# Patient Record
Sex: Female | Born: 1960 | Race: White | Hispanic: No | Smoking: Never smoker
Health system: Southern US, Community
[De-identification: ages and names within clinical notes are randomized; demographics above are authoritative.]

## PROBLEM LIST (undated history)

## (undated) ENCOUNTER — Emergency Department (HOSPITAL_COMMUNITY): Payer: BC Managed Care – PPO

## (undated) DIAGNOSIS — F32A Depression, unspecified: Secondary | ICD-10-CM

## (undated) DIAGNOSIS — E559 Vitamin D deficiency, unspecified: Secondary | ICD-10-CM

## (undated) DIAGNOSIS — E669 Obesity, unspecified: Secondary | ICD-10-CM

## (undated) DIAGNOSIS — S83281D Other tear of lateral meniscus, current injury, right knee, subsequent encounter: Secondary | ICD-10-CM

## (undated) DIAGNOSIS — Z923 Personal history of irradiation: Secondary | ICD-10-CM

## (undated) DIAGNOSIS — F329 Major depressive disorder, single episode, unspecified: Secondary | ICD-10-CM

## (undated) DIAGNOSIS — R011 Cardiac murmur, unspecified: Secondary | ICD-10-CM

## (undated) DIAGNOSIS — S83289A Other tear of lateral meniscus, current injury, unspecified knee, initial encounter: Secondary | ICD-10-CM

## (undated) DIAGNOSIS — I10 Essential (primary) hypertension: Secondary | ICD-10-CM

## (undated) DIAGNOSIS — R0602 Shortness of breath: Secondary | ICD-10-CM

## (undated) DIAGNOSIS — R5383 Other fatigue: Secondary | ICD-10-CM

## (undated) DIAGNOSIS — K829 Disease of gallbladder, unspecified: Secondary | ICD-10-CM

## (undated) DIAGNOSIS — R7303 Prediabetes: Secondary | ICD-10-CM

## (undated) DIAGNOSIS — IMO0002 Reserved for concepts with insufficient information to code with codable children: Secondary | ICD-10-CM

## (undated) DIAGNOSIS — G4730 Sleep apnea, unspecified: Secondary | ICD-10-CM

## (undated) DIAGNOSIS — M25569 Pain in unspecified knee: Secondary | ICD-10-CM

## (undated) HISTORY — DX: Sleep apnea, unspecified: G47.30

## (undated) HISTORY — DX: Pain in unspecified knee: M25.569

## (undated) HISTORY — DX: Disease of gallbladder, unspecified: K82.9

## (undated) HISTORY — DX: Depression, unspecified: F32.A

## (undated) HISTORY — DX: Major depressive disorder, single episode, unspecified: F32.9

## (undated) HISTORY — DX: Vitamin D deficiency, unspecified: E55.9

## (undated) HISTORY — DX: Reserved for concepts with insufficient information to code with codable children: IMO0002

## (undated) HISTORY — PX: BREAST LUMPECTOMY: SHX2

## (undated) HISTORY — DX: Obesity, unspecified: E66.9

## (undated) HISTORY — DX: Other fatigue: R53.83

## (undated) HISTORY — DX: Shortness of breath: R06.02

## (undated) HISTORY — DX: Essential (primary) hypertension: I10

---

## 1997-06-14 ENCOUNTER — Other Ambulatory Visit: Admission: RE | Admit: 1997-06-14 | Discharge: 1997-06-14 | Payer: Self-pay | Admitting: *Deleted

## 1998-09-30 ENCOUNTER — Other Ambulatory Visit: Admission: RE | Admit: 1998-09-30 | Discharge: 1998-09-30 | Payer: Self-pay | Admitting: *Deleted

## 1999-02-06 HISTORY — PX: OTHER SURGICAL HISTORY: SHX169

## 1999-02-06 HISTORY — PX: LAPAROSCOPIC CHOLECYSTECTOMY: SUR755

## 1999-09-08 ENCOUNTER — Other Ambulatory Visit: Admission: RE | Admit: 1999-09-08 | Discharge: 1999-09-08 | Payer: Self-pay | Admitting: *Deleted

## 2001-09-08 ENCOUNTER — Ambulatory Visit (HOSPITAL_COMMUNITY): Admission: RE | Admit: 2001-09-08 | Discharge: 2001-09-08 | Payer: Self-pay | Admitting: Cardiology

## 2001-09-09 ENCOUNTER — Encounter: Payer: Self-pay | Admitting: Cardiology

## 2001-09-10 ENCOUNTER — Encounter: Payer: Self-pay | Admitting: *Deleted

## 2001-09-10 ENCOUNTER — Encounter: Admission: RE | Admit: 2001-09-10 | Discharge: 2001-09-10 | Payer: Self-pay | Admitting: *Deleted

## 2002-01-20 ENCOUNTER — Encounter: Payer: Self-pay | Admitting: General Surgery

## 2002-01-23 ENCOUNTER — Encounter (INDEPENDENT_AMBULATORY_CARE_PROVIDER_SITE_OTHER): Payer: Self-pay | Admitting: *Deleted

## 2002-01-23 ENCOUNTER — Observation Stay (HOSPITAL_COMMUNITY): Admission: RE | Admit: 2002-01-23 | Discharge: 2002-01-24 | Payer: Self-pay | Admitting: General Surgery

## 2002-01-23 ENCOUNTER — Encounter: Payer: Self-pay | Admitting: General Surgery

## 2005-02-27 ENCOUNTER — Encounter: Admission: RE | Admit: 2005-02-27 | Discharge: 2005-02-27 | Payer: Self-pay | Admitting: *Deleted

## 2008-06-14 ENCOUNTER — Other Ambulatory Visit: Admission: RE | Admit: 2008-06-14 | Discharge: 2008-06-14 | Payer: Self-pay | Admitting: Obstetrics and Gynecology

## 2010-06-23 NOTE — Op Note (Signed)
NAME:  Anna Martinez, Anna Martinez                           ACCOUNT NO.:  [id number]   MEDICAL RECORD NO.:  [id number]                   PATIENT TYPE:  AMB   LOCATION:  DAY                                  FACILITY:  [facility]   PHYSICIAN:  Timothy E. Earlene Plater, M.D.              DATE OF BIRTH:  Jul 01, 1960   DATE OF PROCEDURE:  01/23/2002  DATE OF DISCHARGE:                                 OPERATIVE REPORT   PREOPERATIVE DIAGNOSES:  Chronic cholecystolithiasis.   POSTOPERATIVE DIAGNOSES:  Chronic cholecystolithiasis.   PROCEDURE:  Laparoscopic cholecystectomy and operative cholangiogram.   SURGEON:  Timothy E. Earlene Plater, M.D.   ASSISTANT:  Donnie Coffin. Samuella Cota, M.D.   ANESTHESIA:  CRNA supervised M.D.   HISTORY OF PRESENT ILLNESS:  Anna Martinez is 31, very obese, hypertensive,  depression with chronic cholecystolithiasis. A recent cardiac workup  negative per Dr. Clarene Duke and she is prepared and ready for this surgery. This  has been carefully discussed with her.   The patient was seen and evaluated by anesthesia, identified and the permit  signed.   She was taken to the operating room, placed supine, general endotracheal  anesthesia administered. The abdomen was prepped and draped in the usual  fashion. An infraumbilical incision was made after injection of local  anesthetic. The fascia identified, opened vertically, peritoneum entered  without complication, Hasson catheter placed, tied in place with a #1  Vicryl. The abdomen insufflated, general peritoneoscopy was unremarkable. A  second 10 mm trocar placed in the mid epigastrium, two 5 mm trocars in the  right upper quadrant. A 0.25% Marcaine with epinephrine used throughout. The  gallbladder was grasped, placed on tension, the omental adhesions taken down  bluntly. The base of the gallbladder carefully visualized and dissected  free. The cystic duct entering the gallbladder was carefully dissected out.  A complete window was made posteriorly. The  cystic artery identified and  isolated. The cystic duct was clipped near the gallbladder, small incision  made in the cystic duct. The cholangiocath was placed into the cystic duct  percutaneously and using real-time fluoroscopy and half strength dye, a  complete cholangiogram was accomplished showing free and easy flow of dye  into the duodenum and a normal appearing cystic duct and hepatic duct  structures. The catheter was removed and the remnant of the cystic duct was  doubly clipped. The cystic duct completely divided, the artery isolated,  triply clipped and divided and the gallbladder dissected from the  gallbladder bed with cautery blunt dissection. A small incision was made in  the gallbladder and there was a small leak of bile. The gallbladder was  completely removed from the liver bed and placed in an EndoCatch bag. Then  further inspection revealed the gallbladder bed to be dry. The gallbladder  was removed through the infraumbilical incision which was then tied with #1  Vicryl. The area of dissection was copiously irrigated  until clear. And then  all irrigant,  CO2, instruments and trocar removed under direct vision. Counts were  correct, she tolerated it well. The skin incisions closed with 3-0 Monocryl  sutures and Steri-Strips. Final counts correct. She was awakened and taken  to the recovery room in good condition.                                               Timothy E. Earlene Plater, M.D.    TED/MEDQ  D:  01/23/2002  T:  01/23/2002  Job:  [id number]   cc:   Talmadge Coventry, M.D.  [address], [address]  [place]  [place] [address]  Fax: [phone]   Thereasa Solo. Little, M.D.  [address]  [place] [address]  Fax: [phone]

## 2012-08-11 ENCOUNTER — Encounter: Payer: Self-pay | Admitting: Obstetrics and Gynecology

## 2012-08-12 ENCOUNTER — Ambulatory Visit (INDEPENDENT_AMBULATORY_CARE_PROVIDER_SITE_OTHER): Payer: BC Managed Care – PPO | Admitting: Obstetrics and Gynecology

## 2012-08-12 ENCOUNTER — Encounter: Payer: Self-pay | Admitting: Obstetrics and Gynecology

## 2012-08-12 VITALS — BP 130/80 | Ht 68.5 in | Wt 347.0 lb

## 2012-08-12 DIAGNOSIS — Z01419 Encounter for gynecological examination (general) (routine) without abnormal findings: Secondary | ICD-10-CM

## 2012-08-12 NOTE — Progress Notes (Signed)
52 y.o.   Single    Caucasian   female   G1P1   here for annual exam.  Menses more irregular, about every other month.  Some trouble sleeping.    Patient's last menstrual period was 06/23/2012.          Sexually active: no  The current method of family planning is abstinence.    Exercising: no Last mammogram:  2010 normal Last pap smear:08/23/09 neg History of abnormal pap: no Smoking:never Alcohol:no Last colonoscopy:never; pt states she understands I rec she go but declines Last Bone Density:  never Last tetanus shot: 2011 Last cholesterol check: pcp  Hgb:pcp                Urine:pcp   Family History  Problem Relation Age of Onset  . Hypertension Mother   . Hypertension Father   . Cancer Father     prostate    There are no active problems to display for this patient.   Past Medical History  Diagnosis Date  . Depression   . Hypertension   . Obesity     Past Surgical History  Procedure Laterality Date  . Laparoscopy cholectomy  2001    Allergies: Penicillins  Current Outpatient Prescriptions  Medication Sig Dispense Refill  . atenolol (TENORMIN) 25 MG tablet       . losartan-hydrochlorothiazide (HYZAAR) 100-25 MG per tablet        No current facility-administered medications for this visit.    ROS: Pertinent items are noted in HPI.  Social Hx:  Single, one daughter born 1997, works as a Runner, broadcasting/film/video  Exam:    BP 130/80  Ht 5' 8.5" (1.74 m)  Wt 347 lb (157.398 kg)  BMI 51.99 kg/m2  LMP 06/23/2012  Wt up 22 pounds from last year Wt Readings from Last 3 Encounters:  08/12/12 347 lb (157.398 kg)     Ht Readings from Last 3 Encounters:  08/12/12 5' 8.5" (1.74 m)    General appearance: alert, cooperative and appears stated age Head: Normocephalic, without obvious abnormality, atraumatic Neck: no adenopathy, supple, symmetrical, trachea midline and thyroid not enlarged, symmetric, no tenderness/mass/nodules Lungs: clear to auscultation  bilaterally Breasts: Inspection negative, No nipple retraction or dimpling, No nipple discharge or bleeding, No axillary or supraclavicular adenopathy, Normal to palpation without dominant masses Heart: regular rate and rhythm Abdomen: soft, non-tender; bowel sounds normal; no masses,  no organomegaly Extremities: extremities normal, atraumatic, no cyanosis or edema Skin: Skin color, texture, turgor normal. No rashes or lesions Lymph nodes: Cervical, supraclavicular, and axillary nodes normal. No abnormal inguinal nodes palpated Neurologic: Grossly normal   Pelvic: External genitalia:  no lesions              Urethra:  normal appearing urethra with no masses, tenderness or lesions              Bartholins and Skenes: normal                 Vagina: normal appearing vagina with normal color and discharge, no lesions              Cervix: normal appearance              Pap taken: yes        Bimanual Exam:  Uterus:  uterus is non tender but size difficult d/t habitus  Adnexa: nontender and no masses                                      Rectovaginal: Confirms                                      Anus:  normal sphincter tone, no lesions  A: normal perimenopausal exam     obesity     P: mammogram pap smear counseled on breast self exam, mammography screening, adequate intake of calcium and vitamin D, diet and exercise return annually or prn   Rec:  PUS since pelvic exam is limited by habitus.     An After Visit Summary was printed and given to the patient.

## 2012-08-12 NOTE — Patient Instructions (Addendum)

## 2012-08-13 DIAGNOSIS — R87619 Unspecified abnormal cytological findings in specimens from cervix uteri: Secondary | ICD-10-CM

## 2012-08-13 DIAGNOSIS — IMO0002 Reserved for concepts with insufficient information to code with codable children: Secondary | ICD-10-CM

## 2012-08-13 HISTORY — DX: Reserved for concepts with insufficient information to code with codable children: IMO0002

## 2012-08-13 HISTORY — DX: Unspecified abnormal cytological findings in specimens from cervix uteri: R87.619

## 2012-08-13 NOTE — Addendum Note (Signed)
Addended by: Alison Murray on: 08/13/2012 09:39 AM   Modules accepted: Orders

## 2012-08-15 LAB — IPS PAP TEST WITH HPV

## 2012-08-20 ENCOUNTER — Encounter: Payer: Self-pay | Admitting: Obstetrics and Gynecology

## 2012-08-20 ENCOUNTER — Ambulatory Visit (INDEPENDENT_AMBULATORY_CARE_PROVIDER_SITE_OTHER): Payer: BC Managed Care – PPO | Admitting: Obstetrics and Gynecology

## 2012-08-20 ENCOUNTER — Ambulatory Visit (INDEPENDENT_AMBULATORY_CARE_PROVIDER_SITE_OTHER): Payer: BC Managed Care – PPO

## 2012-08-20 NOTE — Progress Notes (Signed)
     52 yo single WF G1P1 with morbid obesity here for PUS due to limited pelvic exam. Images reviewed.  PUS shows nl uterus and ovaries.  Pt reassured.  Rec:  Repeat USG about every 2-3 years or as rec by her physician.

## 2012-08-20 NOTE — Patient Instructions (Signed)
Return for routine care 

## 2012-08-21 ENCOUNTER — Other Ambulatory Visit: Payer: Self-pay | Admitting: *Deleted

## 2012-08-21 DIAGNOSIS — IMO0002 Reserved for concepts with insufficient information to code with codable children: Secondary | ICD-10-CM

## 2012-08-29 ENCOUNTER — Encounter: Payer: Self-pay | Admitting: Obstetrics and Gynecology

## 2012-08-29 ENCOUNTER — Ambulatory Visit (INDEPENDENT_AMBULATORY_CARE_PROVIDER_SITE_OTHER): Payer: BC Managed Care – PPO | Admitting: Obstetrics and Gynecology

## 2012-08-29 VITALS — BP 130/76 | HR 82 | Resp 18 | Ht 68.5 in | Wt 350.0 lb

## 2012-08-29 DIAGNOSIS — R6889 Other general symptoms and signs: Secondary | ICD-10-CM

## 2012-08-29 DIAGNOSIS — IMO0002 Reserved for concepts with insufficient information to code with codable children: Secondary | ICD-10-CM

## 2012-08-29 NOTE — Progress Notes (Signed)
52 y.o. Single  Caucasian  female  OB History   Grav Para Term Preterm Abortions TAB SAB Ect Mult Living   1 1        1      here for colposcopy. 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    Patient's last menstrual period was 06/23/2012.  Contraception:  Not sexually active for the last 8 years   Blood pressure 130/76, pulse 82, resp. rate 18, height 5' 8.5" (1.74 m), weight 350 lb (158.759 kg), last menstrual period 06/23/2012.  Procedure explained and patient's questions were invited and answered.  Consent form signed.    Role of HPV in genesis of SIL discussed with patient, and questions answered.   Speculum inserted atraumatically and cervix visualized.  3% acetic acid applied.  Cervix examined using 3.75 and 7.5   X magnification and green filter.    Gross appearance:normal  Squamocolumnar junction seen in entirety: no   no visible lesions  endocervical curettage performed and specimen labelled and sent to pathology    Patient tolerated procedure well.   Plan:  Will base further treatment on Pathology findings. Vigorous ECC done using a tenac to stabilize the cx, since the os was quite small.  With pt not being sexually active for the last 8 years, if ECC does not show HGSIL, rec:  Pap r one year with cotesting.  Post biopsy instructions and AVS given to patient.

## 2012-08-29 NOTE — Patient Instructions (Signed)
Colposcopy  Care After  Colposcopy is a procedure in which a special tool is used to magnify the surface of the cervix. A tissue sample (biopsy) may also be taken. This sample will be looked at for cervical cancer or other problems. After the test:  · You may have some cramping.  · Lie down for a few minutes if you feel lightheaded.  ·  You may have some bleeding which should stop in a few days.  HOME CARE  · Do not have sex or use tampons for 2 to 3 days or as told.  · Only take medicine as told by your doctor.  · Continue to take your birth control pills as usual.  Finding out the results of your test  Ask when your test results will be ready. Make sure you get your test results.  GET HELP RIGHT AWAY IF:  · You are bleeding a lot or are passing blood clots.  · You develop a fever of 102° F (38.9° C) or higher.  · You have abnormal vaginal discharge.  · You have cramps that do not go away with medicine.  · You feel lightheaded, dizzy, or pass out (faint).  MAKE SURE YOU:   · Understand these instructions.  · Will watch your condition.  · Will get help right away if you are not doing well or get worse.  Document Released: 07/11/2007 Document Revised: 04/16/2011 Document Reviewed: 07/11/2007  ExitCare® Patient Information ©2014 [employer].

## 2012-09-02 LAB — IPS CERVICAL/ECC/EMB/VULVAR/VAGINAL BIOPSY

## 2013-09-04 ENCOUNTER — Encounter: Payer: Self-pay | Admitting: Gynecology

## 2013-09-04 ENCOUNTER — Ambulatory Visit (INDEPENDENT_AMBULATORY_CARE_PROVIDER_SITE_OTHER): Payer: BC Managed Care – PPO | Admitting: Gynecology

## 2013-09-04 ENCOUNTER — Telehealth: Payer: Self-pay | Admitting: Gynecology

## 2013-09-04 VITALS — BP 124/80 | HR 72 | Resp 16 | Ht 68.5 in | Wt 321.0 lb

## 2013-09-04 DIAGNOSIS — N951 Menopausal and female climacteric states: Secondary | ICD-10-CM

## 2013-09-04 DIAGNOSIS — IMO0002 Reserved for concepts with insufficient information to code with codable children: Secondary | ICD-10-CM

## 2013-09-04 DIAGNOSIS — R6889 Other general symptoms and signs: Secondary | ICD-10-CM

## 2013-09-04 DIAGNOSIS — Z01419 Encounter for gynecological examination (general) (routine) without abnormal findings: Secondary | ICD-10-CM

## 2013-09-04 DIAGNOSIS — Z8742 Personal history of other diseases of the female genital tract: Secondary | ICD-10-CM

## 2013-09-04 MED ORDER — NORETHIN-ETH ESTRAD-FE BIPHAS 1 MG-10 MCG / 10 MCG PO TABS: 1.0000 | ORAL_TABLET | Freq: Every day | ORAL | Status: DC

## 2013-09-04 NOTE — Telephone Encounter (Signed)
Patient wants to know if there is another medication we can give her instead of Norethindrone-Ethinyl Estradiol-Fe Biphas (LO LOESTRIN FE) 1 MG-10 MCG / 10 MCG tablet  Take 1 tablet by mouth daily., Starting 09/04/2013, Until Discontinued, Normal, Last Dose: Not Recorded  Refills: 3 ordered Pharmacy: [id number] - , [address] AT [place] that that it is costing her $54 a month.

## 2013-09-04 NOTE — Progress Notes (Signed)
53 y.o. Single Caucasian female   G1P1 here for annual exam. Pt reports menses are regular.  She does report hot flashes, does not have night sweats, does have vaginal dryness.  She is not using lubricants.  Pt reports starting microgestin to regulate menses in peri-menopause- started by PCP.  Symptoms are better.  Patient's last menstrual period was 07/21/2013.          Sexually active: No.  The current method of family planning is OCP (estrogen/progesterone).    Exercising: Yes.    Weights, pool, stationary bike 2x/wk Last pap: 08/13/12 LGSIL - HPV Abnormal PAP: 08/13/12 Mammogram: 2010  BSE:yes Colonoscopy: never had one DEXA: none Alcohol: no Tobacco: no   Health Maintenance  Topic Date Due  . Tetanus/tdap  07/12/1978  . Mammogram  07/11/2009  . Colonoscopy  07/11/2009  . Influenza Vaccine  09/05/2013  . Pap Smear  08/14/2015    Family History  Problem Relation Age of Onset  . Hypertension Mother   . Hypertension Father   . Cancer Father     prostate    There are no active problems to display for this patient.   Past Medical History  Diagnosis Date  . Depression   . Hypertension   . Obesity   . Abnormal Pap smear 08/13/12    LSIL may include HPV or mild Dysplasia (CIN1)    Past Surgical History  Procedure Laterality Date  . Laparoscopy cholectomy  2001    Allergies: Penicillins  Current Outpatient Prescriptions  Medication Sig Dispense Refill  . atenolol (TENORMIN) 25 MG tablet       . losartan-hydrochlorothiazide (HYZAAR) 100-25 MG per tablet       . Multiple Vitamins-Calcium (ONE-A-DAY WOMENS PO) Take by mouth.      Marland Kitchen MICROGESTIN 1-20 MG-MCG tablet daily.       No current facility-administered medications for this visit.    ROS: Pertinent items are noted in HPI.  Exam:    BP 124/80  Pulse 72  Resp 16  Ht 5' 8.5" (1.74 m)  Wt 321 lb (145.605 kg)  BMI 48.09 kg/m2  LMP 07/21/2013 Weight change: @WEIGHTCHANGE @ Last 3 height recordings:  Ht Readings  from Last 3 Encounters:  09/04/13 5' 8.5" (1.74 m)  08/29/12 5' 8.5" (1.74 m)  08/12/12 5' 8.5" (1.74 m)   General appearance: alert, cooperative and appears stated age Head: Normocephalic, without obvious abnormality, atraumatic Neck: no adenopathy, no carotid bruit, no JVD, supple, symmetrical, trachea midline and thyroid not enlarged, symmetric, no tenderness/mass/nodules Lungs: clear to auscultation bilaterally Breasts: normal appearance, no masses or tenderness Heart: regular rate and rhythm, S1, S2 normal, no murmur, click, rub or gallop Abdomen: soft, non-tender; bowel sounds normal; no masses,  no organomegaly Extremities: extremities normal, atraumatic, no cyanosis or edema Skin: Skin color, texture, turgor normal. No rashes or lesions Lymph nodes: Cervical, supraclavicular, and axillary nodes normal. no inguinal nodes palpated Neurologic: Grossly normal   Pelvic: External genitalia:  no lesions              Urethra: normal appearing urethra with no masses, tenderness or lesions              Bartholins and Skenes: Bartholin's, Urethra, Skene's normal                 Vagina: normal appearing vagina with normal color and discharge, no lesions              Cervix: normal appearance  Pap taken: Yes.          Bimanual Exam:  Uterus:  uterus is normal size, shape, consistency and nontender                                      Adnexa:    no masses                                      Rectovaginal: Confirms                                      Anus:  normal sphincter tone, no lesions   1. Routine Gyn exam counseled on breast self exam, mammography screening-overdue!!  09/07/13, 11:45am  Will make appt for pt before she leaves today ,adequate intake of calcium and vitamin D, diet and exercise return annually or prn Discussed PAP guideline changes, importance of weight bearing exercises, calcium, vit D and balanced diet.  2. Perimenopausal symptoms Will decrease  estrogen from 60mcg to 13mcg and then off, check [place] off estrogen - Norethindrone-Ethinyl Estradiol-Fe Biphas (LO LOESTRIN FE) 1 MG-10 MCG / 10 MCG tablet; Take 1 tablet by mouth daily.  Dispense: 1 Package; Refill: 11  3. History of abnormal cervical Pap smear Reviewed LGSIL and HPV association with cervical dysplasia, repeat today and reivewed f/u anticipated.  Natural course of HPV infection - Pap Test with HP (IPS)   An After Visit Summary was printed and given to the patient.

## 2013-09-06 ENCOUNTER — Other Ambulatory Visit: Payer: Self-pay | Admitting: Gynecology

## 2013-09-07 NOTE — Telephone Encounter (Signed)
Tiffany A Decker at 09/04/2013 3:37 PM     Status: Signed        Patient wants to know if there is another medication we can give her instead of Norethindrone-Ethinyl Estradiol-Fe Biphas (LO LOESTRIN FE) 1 MG-10 MCG / 10 MCG tablet  Take 1 tablet by mouth daily., Starting 09/04/2013, Until Discontinued, Normal, Last Dose: Not Recorded  Refills: 3 ordered Pharmacy: [id number] - [place], [address] AT [place] that that it is costing her $54 a month.   Last AEX: 09/04/13 Pt wanted something else because of the cost

## 2013-09-07 NOTE — Telephone Encounter (Signed)
pls confirm that Lo Loestrin is $54/m and not for the 25m, if later, we can change for 1/m for 22m,   Pt can stop ocp and we can check an [facility] as well as she is 53yo and not worry about the cost of the ocp

## 2013-09-08 LAB — IPS PAP TEST WITH HPV

## 2013-09-08 NOTE — Telephone Encounter (Signed)
S/W Pharmacy.  [facility] stated its $64 and that's for 1 mth. Would you like the pt to stop ocp and have her [facility] checked or give her a cheaper rx?

## 2013-09-09 ENCOUNTER — Other Ambulatory Visit: Payer: Self-pay | Admitting: *Deleted

## 2013-09-09 DIAGNOSIS — N951 Menopausal and female climacteric states: Secondary | ICD-10-CM

## 2013-09-09 NOTE — Telephone Encounter (Signed)
Faxed refill request received from [facility] for [place] 1/20 21'S  Pt was given refill on LO LOESTRIN on 09/04/13, #1 X 3 Last AEX - 09/04/13 Next AEX - 09/07/14 RX denied.  Dose decrease at AEX.  Pt should take new RX.  See 09/06/13 phone note for more information regarding new LoLoestrin RX.

## 2013-09-10 NOTE — Telephone Encounter (Signed)
I called pt to discuss pap results and pt asked again about medication. Pt would like to know:  1 - When to have Jennette drawn? 2 - advised pt that she may be able to stop OCP and not worry about cost, but is worried about side effects of abruptly stopping.  Wants to know if there is a cheaper alternative than LoLoestrin.  Offered pt savings card, but pt declined saying she has had a problem with cards in the past with this insurance when trying to get her daughter's medication.  Please advise.  Pt aware Dr. Charlies Constable out of the office today and is OK to wait until tomorrow for response.

## 2013-09-11 NOTE — Telephone Encounter (Signed)
Patient aware to stop ocp and return to our office to have fsh level checked, if she is menopausal then she's aware we can start her on post-menopausal estrogen. Aware to use condoms until we can determine if she is menopausal. Will call insurance to check and see if it's covered.

## 2013-09-11 NOTE — Telephone Encounter (Signed)
Pt is 17 and can stop ocp without weaning, and rto for labs, if fsh is high we can put her on post-menopausal estrogen if she needs, it is not high enough to suppress ovary.  Should use condom until we establish she is PM.

## 2013-12-07 ENCOUNTER — Encounter: Payer: Self-pay | Admitting: Gynecology

## 2014-08-10 ENCOUNTER — Telehealth: Payer: Self-pay | Admitting: Obstetrics & Gynecology

## 2014-08-10 NOTE — Telephone Encounter (Signed)
Patient is a previous [facility] patient that would like to see Dr. Sabra Heck for hormone replacement therapy.

## 2014-08-10 NOTE — Telephone Encounter (Signed)
Spoke with patient. Patient states that she would like to discuss starting on HRT with Dr.Miller. Appointment scheduled for 08/20/2014 at 2:30pm with Dr.Miller. Patient is agreeable to date and time. States she has had lab work performed with her PCP that she would like to have sent to our office. Fax number provided.  Routing to provider for final review. Patient agreeable to disposition. Will close encounter.

## 2014-08-20 ENCOUNTER — Encounter: Payer: Self-pay | Admitting: Obstetrics & Gynecology

## 2014-08-20 ENCOUNTER — Ambulatory Visit (INDEPENDENT_AMBULATORY_CARE_PROVIDER_SITE_OTHER): Payer: BC Managed Care – PPO | Admitting: Obstetrics & Gynecology

## 2014-08-20 VITALS — BP 158/82 | HR 72 | Resp 16 | Ht 68.5 in | Wt 340.0 lb

## 2014-08-20 DIAGNOSIS — N951 Menopausal and female climacteric states: Secondary | ICD-10-CM | POA: Diagnosis not present

## 2014-08-20 DIAGNOSIS — Z79890 Hormone replacement therapy (postmenopausal): Secondary | ICD-10-CM

## 2014-08-20 MED ORDER — PROGESTERONE MICRONIZED 200 MG PO CAPS: ORAL_CAPSULE | ORAL | Status: DC

## 2014-08-20 MED ORDER — ESTRADIOL 1 MG PO TABS: 1.0000 mg | ORAL_TABLET | Freq: Every day | ORAL | Status: DC

## 2014-08-20 NOTE — Progress Notes (Signed)
Patient ID: Anna Martinez, female   DOB: 1960/03/18, 54 y.o.   MRN: [id number]   54 yo G1P1 SWF here discussion of HRT.  Was on Loloestrin and stopped in July of 2015.  Since then has been experiencing hot flashes, night sweats, sleep disturbance.  Had lost 25 pounds in early 2015 and since stopping the Loloestrin, has gained most of it back.  She contributes some of this to sleep disturbance and fatigue.  Would like to discuss HRT usage.    Reviewed medical hx, surgical hx, family hx, meds, allergies with pt.  Reviewed risks with pt including DVT, PE, stroke, MI, increased breast cancer risks as well as benefits including decreased risks of colon cancer, improved bone health and improved quality of life measures.  Pt feels HRT is something she would like to try.    Last MMG 09/07/13.  Aware I would highly recommend yearly MMG if on HRT.  Pt in agreement.    Assessment:  Menopausal symptoms, desirous of starting HRT  Plan:  Estradiol 1.0mg  daily and Prometrium 200mg  nightly days 1-15 each month.  Expect the possibility of bleeding.   Recheck 6-8 weeks for AEX and f/u after starting HRT.   ~15 minutes spent with patient >50% of time was in face to face discussion of above.

## 2014-08-21 ENCOUNTER — Encounter: Payer: Self-pay | Admitting: Obstetrics & Gynecology

## 2014-08-21 DIAGNOSIS — Z6841 Body Mass Index (BMI) 40.0 and over, adult: Secondary | ICD-10-CM | POA: Insufficient documentation

## 2014-08-21 DIAGNOSIS — Z79890 Hormone replacement therapy (postmenopausal): Secondary | ICD-10-CM | POA: Insufficient documentation

## 2014-09-07 ENCOUNTER — Ambulatory Visit: Payer: BC Managed Care – PPO | Admitting: Gynecology

## 2014-09-14 ENCOUNTER — Telehealth: Payer: Self-pay | Admitting: Obstetrics & Gynecology

## 2014-09-14 DIAGNOSIS — N959 Unspecified menopausal and perimenopausal disorder: Secondary | ICD-10-CM

## 2014-09-14 NOTE — Telephone Encounter (Signed)
Return call to Kaitlyn. °

## 2014-09-14 NOTE — Telephone Encounter (Signed)
She was on OCP until last July when she stopped and started having menopausal symptoms.  Highly unlikely that she needs OCPs but could come in for [facility] to be completely sure.  If elevated, I would recommended continuing with HRT.

## 2014-09-14 NOTE — Telephone Encounter (Signed)
Left message to call Wandra Babin at [phone]. 

## 2014-09-14 NOTE — Telephone Encounter (Addendum)
Patient would like to speak with nurse because she is spotting on the progesterone. Chart to triage.

## 2014-09-14 NOTE — Telephone Encounter (Signed)
Spoke with patient. Patient states that she started taking Progesterone on 8/1. Patient started to have spotting a couple of days ago. Advised this is common with taking Progesterone. States she has been taking this in the morning instead of at night because she can not remember it otherwise. Patient is in a new relationship and is worried about pregnancy. Interested in taking oral birth control. Advised I will speak with Dr.Miller regarding her concerns and return call as OCP is likely not needed at this time. Patient is agreeable and verbalizes understanding.

## 2014-09-15 NOTE — Telephone Encounter (Signed)
Left message to call Damondre Pfeifle at [phone]. 

## 2014-09-15 NOTE — Telephone Encounter (Signed)
Spoke with patient. Advised of message as seen below from [place]. Patient is agreeable. Lab appointment for [facility] scheduled for tomorrow 09/16/2014 at [place]. Patient is agreeable to date and time.  Routing to provider for final review. Patient agreeable to disposition. Will close encounter.

## 2014-09-16 ENCOUNTER — Other Ambulatory Visit (INDEPENDENT_AMBULATORY_CARE_PROVIDER_SITE_OTHER): Payer: BC Managed Care – PPO

## 2014-09-16 DIAGNOSIS — N959 Unspecified menopausal and perimenopausal disorder: Secondary | ICD-10-CM

## 2014-09-16 LAB — FOLLICLE STIMULATING HORMONE: FSH: 35.1 m[IU]/mL

## 2014-09-20 ENCOUNTER — Telehealth: Payer: Self-pay

## 2014-09-20 NOTE — Telephone Encounter (Signed)
-----   Message from Robley Fries, [place] sent at 09/17/2014  1:24 PM EDT ----- Verline Lema, I received this by mistake.//kn

## 2014-09-20 NOTE — Telephone Encounter (Signed)
It is fine for her to switch to micronor.  Much lower dosage of progesterone.  Doubtful she will bleed on this but will give complete assurance for contraception.  Does this answer the question?

## 2014-09-20 NOTE — Telephone Encounter (Signed)
Spoke with patient. Advised of results as seen below from [place]. Patient is agreeable. Patient states with taking Estradiol and Progesterone together days 1-15 of the month she has been emotional, had more acne, and bleeding. Advised light bleeding is not uncommon with taking Progesterone. Advised will need to speak with Dr.Miller regarding increased acne and emotions. Paitent is agreeable.  Notes Recorded by Robley Fries, CMA on 09/17/2014 at 1:24 PM [place], I received this by mistake.//kn Notes Recorded by Megan Salon, MD on 09/17/2014 at 1:22 PM Please inform pt FSH is 35. This is in menopausal range and her pregnancy risk is extremely low but if she has an anxiety about this at all, can go on OCP. I would use micronor for her. She can stop the HRT and start micronor. Ok to send rx to pharmacy if she desires. Has AEX scheduled in Sept and we could reassess and discuss at that time.

## 2014-09-21 NOTE — Telephone Encounter (Signed)
Per previous phone call with patient she is less worried about contraception needs now that [facility] has returned. Is worried about having menopausal symptoms if she switches to Micronor. Prefers to stay on Estradiol Progesterone combination but is concerned about the side effects of acne, increased emotions, and bleeding. Wondering if she needs to decrease progesterone dosage. Sorry my last message was not very descriptive.

## 2014-09-22 ENCOUNTER — Other Ambulatory Visit: Payer: Self-pay | Admitting: Obstetrics & Gynecology

## 2014-09-22 MED ORDER — PROGESTERONE MICRONIZED 100 MG PO CAPS: ORAL_CAPSULE | ORAL | Status: DC

## 2014-09-22 NOTE — Telephone Encounter (Signed)
Spoke with patient. Advised of message as seen below from [place]. Patient is agreeable and verbalizes understanding. Will call if becomes more fatigued or tired with taking Progesterone in the morning next month.  Routing to provider for final review. Patient agreeable to disposition. Will close encounter.

## 2014-09-22 NOTE — Telephone Encounter (Signed)
Spoke with patient. Advised of message as seen below from [place]. Patient is agreeable and verbalizes understanding. Rx for Progesterone 100 mg daily #15 1RF until aex sent to pharmacy on file. Patient is asking if she can start taking this medication in the morning. "She told me to take it at night, but I can never remember it at night. I would do better taking it in the morning." Advised will speak with Dr.Miller and return call. Patient is agreeable.  Dr.Miller, Okay for patient to take Progesterone in the AM?

## 2014-09-22 NOTE — Telephone Encounter (Signed)
Ok to decrease progesterone to 100mg  daily.  I would change from days 1-15 to daily dosing.  Rx will need changing.  Thanks.

## 2014-09-22 NOTE — Telephone Encounter (Signed)
AM is fine.  It just sometimes makes women sleepy.  So if feeling more fatigue or tired with it, will change to a different progesterone.  Just have her let us know.  Thanks.

## 2014-09-22 NOTE — Telephone Encounter (Signed)
09/22/14 #15/1 rfs sent to [facility] on [address]-rx denied.

## 2014-10-15 ENCOUNTER — Telehealth: Payer: Self-pay | Admitting: Obstetrics & Gynecology

## 2014-10-15 ENCOUNTER — Ambulatory Visit: Payer: BC Managed Care – PPO | Admitting: Obstetrics & Gynecology

## 2014-10-15 NOTE — Telephone Encounter (Signed)
Patient rescheduled appointment to 10/19/14 at 4:00pm.    Routing to provider for final review. Patient agreeable to disposition. Will close encounter.

## 2014-10-15 NOTE — Telephone Encounter (Signed)
Patient says she is okay to be seen 10/19/14 at 4:00 with Dr. Sabra Heck. Appointment scheduled by Bertha Stakes.

## 2014-10-15 NOTE — Telephone Encounter (Signed)
Call to patient to reschedule appointment for today 10/15/14 at 3:00 to Tuesday 10/19/14 at 4:00, or to another day if that works better for patient. Left patient voicemail to return my call.

## 2014-10-19 ENCOUNTER — Ambulatory Visit (INDEPENDENT_AMBULATORY_CARE_PROVIDER_SITE_OTHER): Payer: BC Managed Care – PPO | Admitting: Obstetrics & Gynecology

## 2014-10-19 ENCOUNTER — Encounter: Payer: Self-pay | Admitting: Obstetrics & Gynecology

## 2014-10-19 ENCOUNTER — Telehealth: Payer: Self-pay

## 2014-10-19 VITALS — BP 140/86 | HR 72 | Resp 14 | Ht 68.5 in | Wt 335.0 lb

## 2014-10-19 DIAGNOSIS — Z124 Encounter for screening for malignant neoplasm of cervix: Secondary | ICD-10-CM

## 2014-10-19 DIAGNOSIS — Z202 Contact with and (suspected) exposure to infections with a predominantly sexual mode of transmission: Secondary | ICD-10-CM | POA: Diagnosis not present

## 2014-10-19 DIAGNOSIS — Z0000 Encounter for general adult medical examination without abnormal findings: Secondary | ICD-10-CM

## 2014-10-19 DIAGNOSIS — Z23 Encounter for immunization: Secondary | ICD-10-CM | POA: Diagnosis not present

## 2014-10-19 DIAGNOSIS — Z01419 Encounter for gynecological examination (general) (routine) without abnormal findings: Secondary | ICD-10-CM

## 2014-10-19 LAB — POCT URINALYSIS DIPSTICK
Bilirubin, UA: NEGATIVE
Blood, UA: 50
Glucose, UA: NEGATIVE
Ketones, UA: NEGATIVE
Leukocytes, UA: NEGATIVE
Nitrite, UA: NEGATIVE
Protein, UA: NEGATIVE
Urobilinogen, UA: NEGATIVE
pH, UA: 5

## 2014-10-19 MED ORDER — FLUCONAZOLE 150 MG PO TABS: ORAL_TABLET | ORAL | Status: DC

## 2014-10-19 MED ORDER — ESTRADIOL 1 MG PO TABS: 1.0000 mg | ORAL_TABLET | Freq: Every day | ORAL | Status: DC

## 2014-10-19 MED ORDER — MEDROXYPROGESTERONE ACETATE 5 MG PO TABS: 5.0000 mg | ORAL_TABLET | Freq: Every day | ORAL | Status: DC

## 2014-10-19 MED ORDER — NYSTATIN 100000 UNIT/GM EX CREA: 1.0000 "application " | TOPICAL_CREAM | Freq: Two times a day (BID) | CUTANEOUS | Status: DC

## 2014-10-19 NOTE — Patient Instructions (Signed)
Don't forget to call and schedule your mammogram

## 2014-10-19 NOTE — Telephone Encounter (Signed)
Call to [place] to change her Estradiol and Provera to a 90 day supply.//kn

## 2014-10-19 NOTE — Progress Notes (Signed)
54 y.o. G1P1 SingleCaucasianF here for annual exam.  Pt is in a new relationship.  Having some discharge and itching.  Having a little bit of odor.  Did use OTC monistat.    Pt started HRT in mid July.  Pt reports her hot flashes are gone.  She did have some moodiness after starting the Progesterone 200mg  .  This also caused anxiety and hormonal changes.  Dosage was lowered and this better but not where she'd like it to be.    PCP:  Dr. Carlos Levering.  [facility] [address].    Patient's last menstrual period was 07/21/2013.          Sexually active: Yes.    The current method of family planning is none.    Exercising: No.  n/a Smoker:  no  Health Maintenance: Pap:  09/04/13 wnl, HR HPV neg History of abnormal Pap:  no MMG:  09/07/2013 Category b, Bi-rads category 1 neg Colonoscopy: n/a BMD:   n/a TDaP:  Will do today Screening Labs: PCP, Hb today: PCP, Urine today:    reports that she has never smoked. She has never used smokeless tobacco. She reports that she does not drink alcohol or use illicit drugs.  Past Medical History  Diagnosis Date  . Depression   . Hypertension   . Obesity   . 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 Sleep apnea     using c-pap machine    Past Surgical History  Procedure Laterality Date  . Laparoscopy cholectomy  2001    Current Outpatient Prescriptions  Medication Sig Dispense Refill  . atenolol (TENORMIN) 25 MG tablet     . estradiol (ESTRACE) 1 MG tablet Take 1 tablet (1 mg total) by mouth daily. 30 tablet 12  . losartan-hydrochlorothiazide (HYZAAR) 100-25 MG per tablet     . progesterone (PROMETRIUM) 100 MG capsule Take one capsule by mouth days 1-15 of each month. 15 capsule 1   No current facility-administered medications for this visit.    Family History  Problem Relation Age of Onset  . Hypertension Mother   . Hypertension Father   . Cancer Father     prostate    ROS:  Pertinent items are  noted in HPI.  Otherwise, a comprehensive ROS was negative.  Exam:   BP 140/86 mmHg  Pulse 72  Resp 14  Ht 5' 8.5" (1.74 m)  Wt 335 lb (151.955 kg)  BMI 50.19 kg/m2  LMP 07/21/2013  Weight change: +13#  Height: 5' 8.5" (174 cm)  Ht Readings from Last 3 Encounters:  10/19/14 5' 8.5" (1.74 m)  08/20/14 5' 8.5" (1.74 m)  09/04/13 5' 8.5" (1.74 m)    General appearance: alert, cooperative and appears stated age Head: Normocephalic, without obvious abnormality, atraumatic Neck: no adenopathy, supple, symmetrical, trachea midline and thyroid normal to inspection and palpation Lungs: clear to auscultation bilaterally Breasts: normal appearance, no masses or tenderness, appearance of yeast between breasts Heart: regular rate and rhythm Abdomen: soft, non-tender; bowel sounds normal; no masses,  no organomegaly Extremities: extremities normal, atraumatic, no cyanosis or edema Skin: Skin color, texture, turgor normal. No rashes or lesions Lymph nodes: Cervical, supraclavicular, and axillary nodes normal. No abnormal inguinal nodes palpated Neurologic: Grossly normal   Pelvic: External genitalia:  no lesions              Urethra:  normal appearing urethra with no masses, tenderness or lesions  Bartholins and Skenes: normal                 Vagina: normal appearing vagina with normal color, thick whitish discharge noted, minimal odor              Cervix: no lesions              Pap taken: Yes.   Bimanual Exam:  Uterus:  normal size, contour, position, consistency, mobility, non-tender              Adnexa: normal adnexa and no mass, fullness, tenderness               Rectovaginal: Confirms               Anus:  normal sphincter tone, no lesions  Chaperone was present for exam.  A:  Well Woman with normal exam PMP, on HRT ([place] 34 earlier this year) Hypertension H/O LGILS pap with neg HR HPV 2014, Colposcopy 2014 with Dr. Joan Flores Skin yeast Desires STD testing Obesity  P:    Mammogram yearly.  Pt aware is due.  Pt states will schedule. pap smear with HR HPV testing today Tdap today Continue estradiol 1mg  daily.  #90/4RF and changed progesterone to provera 5mg  days 1-15 each month #45/4RF.  Breast cancer risks, DVT/PE, stroke, MI risk reviewed.  Pt desires to continue.   GC/Chl, HIV, RPR obtained today return annually or prn

## 2014-10-20 LAB — HIV ANTIBODY (ROUTINE TESTING W REFLEX): HIV 1&2 Ab, 4th Generation: NONREACTIVE

## 2014-10-20 LAB — RPR

## 2014-10-22 LAB — IPS PAP TEST WITH HPV

## 2014-10-22 LAB — IPS N GONORRHOEA AND CHLAMYDIA BY PCR

## 2014-11-19 ENCOUNTER — Telehealth: Payer: Self-pay | Admitting: Obstetrics & Gynecology

## 2014-11-19 NOTE — Telephone Encounter (Signed)
Patient says her progestrone was change to medroxyPROGESTERone with a different dosage.. Does she need to continue taking after 11/20/14?

## 2014-11-19 NOTE — Telephone Encounter (Signed)
Call to patient. Advised she needs to take Provera 5 mg daily on days 1-15 of the month and continue taking each month on days 1-15. Patient states she just wanted to confirm that this was correct. Patient states she has not had any bleeding and feels this is working well. Advised to call back with any concerns or bleeding. Patient agreeable.  Routing to provider for final review. Patient agreeable to disposition. Will close encounter.

## 2014-11-24 ENCOUNTER — Telehealth: Payer: Self-pay | Admitting: Obstetrics & Gynecology

## 2014-11-24 NOTE — Telephone Encounter (Signed)
Spoke with patient. Advised of message as seen below from [place]. Patient is agreeable and verbalizes understanding. Requesting an appointment the week on 10/31 due to work schedule. Appointment scheduled for 12/06/2014 with Dr.Miller at 1:30pm. Agreeable to date and time. Aware that if she develops new symptoms like tightness or pain in legs, shortness of breath, or increased bleeding will need to be seen earlier for appointment. Aware she may choose to stop the estrogen and progesterone until she sees Dr.Miller. Patient verbalizes understanding.  Cc:Dr.Miller  Routing to provider for final review. Patient agreeable to disposition. Will close encounter.

## 2014-11-24 NOTE — Telephone Encounter (Signed)
Patient thinks she may have some issues coming up because of a new medication.

## 2014-11-24 NOTE — Telephone Encounter (Signed)
Spoke with patient. Patient states that she was changed from Progesterone to Provera due to moodiness she was having. Was previously taking Progesterone 200 mg and began to have moodiness. Dosage decreased to 100 mg and patient felt better, but not 100 percent. This is her first month using the Provera 5 mg days 1-15 of the month. On 10/16 patient began to have bleeding. On 10/17-now bleeding has been bright red and increased for the patient. Is changing her pad/tampon every 4 hours. On 10/18 she began to experience swelling in her ankles. Denies any pain in feet, ankles, or legs, denies any feeling of tightness. Not having any shortness of breath or weakness. Patient is concerned about the symptoms she is having. Advised I will speak with covering provider regarding symptoms and return call with further recommendations. Patient is agreeable.

## 2014-11-24 NOTE — Telephone Encounter (Signed)
I would have patient see Dr. Sabra Heck for reassessment of her hormonal regimen.  I can see they have been working very hard to get just the right mix for her.  Patient may choose to stop the estrogen and progesterone both until she sees Dr Sabra Heck.

## 2014-12-03 ENCOUNTER — Telehealth: Payer: Self-pay | Admitting: Obstetrics & Gynecology

## 2014-12-03 NOTE — Telephone Encounter (Signed)
This is fine.  Thanks.  Ok to close encounter. 

## 2014-12-03 NOTE — Telephone Encounter (Signed)
Patient called to cancel appointment with Dr. Sabra Heck for this upcoming Monday she said that her bleeding has stopped and her ankles have stopped swelling. I offered to try and reschedule patient but she declined. She said she is going to try the provera for one more month to see how it does for her.

## 2014-12-06 ENCOUNTER — Ambulatory Visit: Payer: BC Managed Care – PPO | Admitting: Obstetrics & Gynecology

## 2015-02-02 ENCOUNTER — Telehealth: Payer: Self-pay | Admitting: Obstetrics & Gynecology

## 2015-02-02 NOTE — Telephone Encounter (Signed)
Patient would like to change her Progesterone prescription. Patient said "I would like to go back to my old prescription at 1/2 dose." Confirmed pharmacy with patient.

## 2015-02-02 NOTE — Telephone Encounter (Signed)
Spoke with patient. Patient states that she was started on Progesterone 200 mg days 1-15 each month. Was then changed to taking Progesterone 100 mg days 1-15 each month due to fluctuations in her mood. Then was changed to Provera 5 mg days 1-15 of each month due to still having mood fluctuations. Patient called in on 11/24/2014 stating she was having increased bleeding and swelling in her ankles with the Provera. Patient was advised she needed to be seen for further evaluation with Dr.Miller for medication management. Patient was scheduled for 12/06/2014. Patient cancelled this appointment stating that she was no longer having any problems with the Provera. Patient is calling at this time stating she is having increased bleeding monthly after day 15 of taking the Provera as well as at the end of the month. Bleeding lasts for around one week per patient. Is also experiencing weight gain. Denies any further ankle swelling, leg pain, pain in feet, redness to the area, or shortness of breath. Patient is requesting to go back on Progesterone 100 mg days 1-15 of the month. "That is the medication I felt the best on. I did not feel one hundred percent better, but it has been the best one." Patient declines OV at this time. Advised I will speak with covering provider and return call with additional recommendations. Patient is agreeable.  Routing to [place] as Dr.Miller is out of the office today. Cc: Dr.Miller

## 2015-02-03 ENCOUNTER — Other Ambulatory Visit: Payer: Self-pay | Admitting: Obstetrics & Gynecology

## 2015-02-03 MED ORDER — PROGESTERONE MICRONIZED 100 MG PO CAPS: ORAL_CAPSULE | ORAL | Status: DC

## 2015-02-03 NOTE — Telephone Encounter (Signed)
Spoke with patient. Advised of message as seen below from [place]. Patient states that she would like to try the Prometrium 100 mg days 1-15 of the month again before considering an IUD. Will return call if she decides she would like to proceed with IUD placement.  Routing to provider for final review. Patient agreeable to disposition. Will close encounter.

## 2015-02-03 NOTE — Telephone Encounter (Signed)
Rx changed to Prometrium 100mg  days 1-15 each month.  She may be a candidate for a [place] IUD which would significantly decrease her progesterone symptoms.  Please advise her of this and we can check insurance if she'd like.

## 2015-02-03 NOTE — Telephone Encounter (Signed)
Refill request came for this patients Prometrium. I seen that you and Dr. Sabra Heck had been discussing this so I did not send the refill request to Dr. Sabra Heck. I did not want to "refuse" it either. Pease let me know what the patient decides and I can finish this request -eh

## 2015-02-08 NOTE — Telephone Encounter (Signed)
Dr.Miller refilled patient's rx for Prometrium 100 mg take one tablet daily days 1-15 of the month #15 10RF on 02/03/2015. This request can be closed since she sent it in via the telephone encounter. Thank you.

## 2015-03-17 ENCOUNTER — Telehealth: Payer: Self-pay | Admitting: Obstetrics & Gynecology

## 2015-03-17 NOTE — Telephone Encounter (Signed)
Patient wants to talk with the nurse no information given.  °

## 2015-03-17 NOTE — Telephone Encounter (Signed)
Spoke with patient. Patient would like to know what her weight was prior to starting HRT. "I feel like I have been carrying some extra weight and I want to see if it could be from the medication." Advised patient that at her appointment to start HRT on 08/20/2014 her weight was 340 pounds. Patient states "That is not too far from where I am at now. I guess it is not the medicine. I will continue the medicine and just start taking more measure to lose weight." Advised patient to return call if she needs any further assistance.  Routing to provider for final review. Patient agreeable to disposition. Will close encounter.

## 2015-03-18 ENCOUNTER — Telehealth: Payer: Self-pay | Admitting: Obstetrics & Gynecology

## 2015-03-18 ENCOUNTER — Ambulatory Visit (INDEPENDENT_AMBULATORY_CARE_PROVIDER_SITE_OTHER): Payer: BC Managed Care – PPO | Admitting: Obstetrics & Gynecology

## 2015-03-18 ENCOUNTER — Encounter: Payer: Self-pay | Admitting: Obstetrics & Gynecology

## 2015-03-18 VITALS — BP 138/90 | HR 56 | Temp 98.5°F | Ht 68.5 in | Wt 334.0 lb

## 2015-03-18 DIAGNOSIS — R300 Dysuria: Secondary | ICD-10-CM | POA: Diagnosis not present

## 2015-03-18 DIAGNOSIS — N898 Other specified noninflammatory disorders of vagina: Secondary | ICD-10-CM

## 2015-03-18 LAB — POCT URINALYSIS DIPSTICK
Bilirubin, UA: NEGATIVE
Blood, UA: NEGATIVE
Glucose, UA: NEGATIVE
Ketones, UA: NEGATIVE
Leukocytes, UA: NEGATIVE
Nitrite, UA: NEGATIVE
Protein, UA: NEGATIVE
Urobilinogen, UA: NEGATIVE
pH, UA: 5

## 2015-03-18 MED ORDER — METRONIDAZOLE 0.75 % VA GEL: 1.0000 | Freq: Every day | VAGINAL | Status: DC

## 2015-03-18 MED ORDER — NITROFURANTOIN MONOHYD MACRO 100 MG PO CAPS: 100.0000 mg | ORAL_CAPSULE | Freq: Two times a day (BID) | ORAL | Status: DC

## 2015-03-18 NOTE — Progress Notes (Signed)
Subjective:     Patient ID: Anna Martinez, female   DOB: 1960/04/06, 55 y.o.   MRN: [id number]  HPI 55 yo G1P1 SWF here for 24 hour complaint of vaginal pressure, increased frequency and urgency.  Pt states this feels like a UTI.  No back pain.  She is also having some vaginal discharge with a fishy odor.  No vaginal bleeding.    Pt is SA but with same partner.  Had STD testing 9/16.  Denies fever or abdominal pain.  Review of Systems  All other systems reviewed and are negative.      Objective:   Physical Exam  Constitutional: She is oriented to person, place, and time. She appears well-developed and well-nourished.  Abdominal: Soft. Bowel sounds are normal.  Genitourinary: Uterus normal. There is no rash, tenderness, lesion or injury on the right labia. There is no rash, tenderness, lesion or injury on the left labia. Cervix exhibits no motion tenderness. Right adnexum displays no mass, no tenderness and no fullness. Left adnexum displays no mass, no tenderness and no fullness. Vaginal discharge (grey and watery) found.  Lymphadenopathy:       Right: No inguinal adenopathy present.       Left: No inguinal adenopathy present.  Neurological: She is alert and oriented to person, place, and time.  Skin: Skin is warm and dry.  Psychiatric: She has a normal mood and affect.   Wet smear:  Ph 4.0.  KOH: +Whiff, neg yeast  Saline: neg trich, +WBC, +bacteria    Assessment:     BV UTI-like symptoms     Plan:     Urine culture pending  Macrobid 100mg  bid x 7 days Metrogel 0.75% qhs x 5 days Affirm pending

## 2015-03-18 NOTE — Telephone Encounter (Signed)
erro  neous encounter

## 2015-03-19 LAB — WET PREP BY MOLECULAR PROBE
Candida species: NEGATIVE
Gardnerella vaginalis: NEGATIVE
Trichomonas vaginosis: NEGATIVE

## 2015-03-19 LAB — URINE CULTURE: Colony Count: 70000

## 2015-07-01 ENCOUNTER — Telehealth: Payer: Self-pay | Admitting: Obstetrics & Gynecology

## 2015-07-01 NOTE — Telephone Encounter (Signed)
Patient says she would like to speak with the nurse about getting a refill on a medication used for bacterial infections. [facility] [phone].

## 2015-07-01 NOTE — Telephone Encounter (Signed)
Return call to patient. She c/o two days of a "dull burning" sensation at entrance of vagina. Feels like she has this pain each time she has intercourse. Used Metrogel after intercourse about 3 weeks ago and felt improved. Had intercourse again and burning restarted. She denies vaginal dryness or pain with intercourse.  No new partner, no STD concerns per patient. She denies vaginal discharge or feelings of burning with urination, no fevers or pelvic pain. She states she feels that burning feelings are external.   Reviewed with Dr. Sabra Heck. Patient will need office visit for assessment.

## 2015-07-01 NOTE — Telephone Encounter (Signed)
Return call to patient. Advised of need for office visit for evaluation before treatment. Reviewed previous Affirm results negative. Discussed OTC hydrocortisone ointment externally BID for irritation. Keep area clean and dry.  Patient asking about yeast treatment. Again advised that Affirm test was negative for BV and yeast and needs evaluation to determine what to treat with.  Can try OTC Monistat 3 or 7 if feels this is yeast infection. Also advised could see urgent care until we can see her on Tuesday. Patient declines urgent care. Appointment scheduled for 07-07-15 at 330. Patient declined earlier appointment due to scheduling limitations.  Routing to provider for final review. Patient agreeable to disposition. Will close encounter.

## 2015-07-07 ENCOUNTER — Ambulatory Visit (INDEPENDENT_AMBULATORY_CARE_PROVIDER_SITE_OTHER): Payer: BC Managed Care – PPO | Admitting: Obstetrics & Gynecology

## 2015-07-07 VITALS — BP 92/60 | HR 64 | Resp 24 | Wt 335.0 lb

## 2015-07-07 DIAGNOSIS — Z202 Contact with and (suspected) exposure to infections with a predominantly sexual mode of transmission: Secondary | ICD-10-CM | POA: Diagnosis not present

## 2015-07-07 DIAGNOSIS — N898 Other specified noninflammatory disorders of vagina: Secondary | ICD-10-CM

## 2015-07-08 ENCOUNTER — Other Ambulatory Visit: Payer: Self-pay | Admitting: Obstetrics & Gynecology

## 2015-07-08 LAB — WET PREP BY MOLECULAR PROBE
Candida species: NEGATIVE
Gardnerella vaginalis: NEGATIVE
Trichomonas vaginosis: NEGATIVE

## 2015-07-08 MED ORDER — ESTRADIOL 0.1 MG/GM VA CREA: TOPICAL_CREAM | VAGINAL | Status: DC

## 2015-07-11 LAB — IPS N GONORRHOEA AND CHLAMYDIA BY PCR

## 2015-07-19 ENCOUNTER — Telehealth: Payer: Self-pay | Admitting: Obstetrics & Gynecology

## 2015-07-19 ENCOUNTER — Encounter: Payer: Self-pay | Admitting: Obstetrics & Gynecology

## 2015-07-19 NOTE — Telephone Encounter (Signed)
Return call to patient. States she was seen last week and started on estrogen cream which she has used three times. Since then has has irritated area on outside of labia that has progressed to painful, red, marble to golf ball size area that is hard and she has has been able to squeeze it and express pus out of it. Denies fever, no other symptoms.  Advised to discontinue Estrogen cream until MD checks area. Sitz bath tonight. Office visit tomorrow at 75 with Dr Talbert Nan ( Dr Sabra Heck out of office.) Patient declined earlier appointment but agrees to call back if gets worse before appointment. Advised Dr Sabra Heck will review call and we will call her back if any additional instruction.   Any additional recommendations?

## 2015-07-19 NOTE — Progress Notes (Signed)
GYNECOLOGY  VISIT   HPI: 55 y.o. G1P1 Single Caucasian female with complaint of vaginal irritation and possible yeast infection.  Pt reports symptoms are worse after being SA.  She has decided to end the relationship she was in and would like STD testing as well.  Has some mild discharge with slight odor.  No urinary symptoms.  No back pain.  No fever.  On HRT with control of hot flashes.  Does occasionally have one but I do not want to increase her oral dosage due to her obesity and hypertension.  Denies VB since 9/16.    H/o LGSIL pap 2014 with colposcopy.  Follow up pap smears with HR HPV testing in 2015 and 2016 both neg with neg HR HPV.  Patient's last menstrual period was 10/17/2014 (exact date).    GYNECOLOGIC HISTORY: Patient's last menstrual period was 10/17/2014 (exact date). Menopausal hormone therapy: estrace 1mg  po q day and pormetrium 100mg  daily as wel.  Patient Active Problem List   Diagnosis Date Noted  . Morbid obesity ([place]) 08/21/2014  . Postmenopausal HRT (hormone replacement therapy) 08/21/2014  . 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 Depression   . Hypertension   . Obesity   . 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 Sleep apnea     using c-pap machine    Past Surgical History  Procedure Laterality Date  . Laparoscopy cholectomy  2001    MEDS:  Reviewed in EPIC and UTD  ALLERGIES: Penicillins  Family History  Problem Relation Age of Onset  . Hypertension Mother   . Hypertension Father   . Cancer Father     prostate    SH:  Single, non smoker  Review of Systems  All other systems reviewed and are negative.   PHYSICAL EXAMINATION:    BP 92/60 mmHg  Pulse 64  Resp 24  Wt 335 lb (151.955 kg)  LMP 10/17/2014 (Exact Date)    General appearance: alert, cooperative and appears stated age Abdomen: soft, non-tender; bowel sounds normal; no  masses,  no organomegaly Skin:  Erythematous, patchy rash between breasts  Pelvic: External genitalia:  no lesions              Urethra:  normal appearing urethra with no masses, tenderness or lesions              Bartholins and Skenes: normal                 Vagina: normal appearing vagina with normal color and discharge, no lesions              Cervix: no lesions              Bimanual Exam:  Uterus:  normal size, contour, position, consistency, mobility, non-tender              Adnexa: no mass, fullness, tenderness              Rectovaginal: No.              Anus:  normal sphincter tone, no lesions  Chaperone was present for exam.  Assessment: Vaginal irritation, likely PMP related Desires STD testing.  Had HIV and RPR 2016.  Desires GC/Chl only today. On HRT Skin rash between breasts  Plan: Affirm testing today.  If negative, will start vaginal estrogen. GC/Chl testing obtained today.  Pt will be called with results. Pt will follow up with dermatology in regards to rash

## 2015-07-19 NOTE — Telephone Encounter (Signed)
Patient is asking to talk with Dr.Miller's nurse about an "issue she is having". Patient said this is not urgent.

## 2015-07-20 ENCOUNTER — Ambulatory Visit: Payer: BC Managed Care – PPO | Admitting: Obstetrics and Gynecology

## 2015-07-20 ENCOUNTER — Telehealth: Payer: Self-pay | Admitting: Obstetrics and Gynecology

## 2015-07-20 NOTE — Telephone Encounter (Signed)
Agree with OV.  As it is draining, may not need anything else.  Ok to close encounter.

## 2015-07-20 NOTE — Telephone Encounter (Signed)
Spoke with patient. Patient states that the area on her vulva has decreased in size. Please see telephone encounter dated 07/19/2015. Patient reports the area is a little less swollen, but is still red and tender. Denies having any fever or chills. Reports she is able to squeeze "pus" out of the area. Patient declines to reschedule her appointment at this time. States she would like to wait a day to see if the swelling continues to decrease. If the area remains the same she will contact the office to schedule an appointment. Aware if she develops fever, chills, or the area begins to swell again she will need to be seen in the office.  If she has symptom changes over night will need to be seen at a local ER. She is agreeable.  Dr.Jertson, anything further for this patient?

## 2015-07-20 NOTE — Telephone Encounter (Signed)
Called patient to reschedule problem visit from dr Gentry Fitz schedule this afternoon and she will see how she is doing first before rescheduling.

## 2015-07-21 ENCOUNTER — Encounter: Payer: Self-pay | Admitting: Obstetrics and Gynecology

## 2015-07-21 ENCOUNTER — Ambulatory Visit (INDEPENDENT_AMBULATORY_CARE_PROVIDER_SITE_OTHER): Payer: BC Managed Care – PPO | Admitting: Obstetrics and Gynecology

## 2015-07-21 VITALS — BP 110/70 | HR 60 | Resp 16 | Wt 336.0 lb

## 2015-07-21 DIAGNOSIS — N764 Abscess of vulva: Secondary | ICD-10-CM | POA: Diagnosis not present

## 2015-07-21 MED ORDER — SULFAMETHOXAZOLE-TRIMETHOPRIM 800-160 MG PO TABS
ORAL_TABLET | ORAL | Status: DC
Start: 2015-07-21 — End: 2015-07-29

## 2015-07-21 NOTE — Patient Instructions (Signed)
Abscess An abscess is an infected area that contains a collection of pus and debris.It can occur in almost any part of the body. An abscess is also known as a furuncle or boil. CAUSES  An abscess occurs when tissue gets infected. This can occur from blockage of oil or sweat glands, infection of hair follicles, or a minor injury to the skin. As the body tries to fight the infection, pus collects in the area and creates pressure under the skin. This pressure causes pain. People with weakened immune systems have difficulty fighting infections and get certain abscesses more often.  SYMPTOMS Usually an abscess develops on the skin and becomes a painful mass that is red, warm, and tender. If the abscess forms under the skin, you may feel a moveable soft area under the skin. Some abscesses break open (rupture) on their own, but most will continue to get worse without care. The infection can spread deeper into the body and eventually into the bloodstream, causing you to feel ill.  DIAGNOSIS  Your caregiver will take your medical history and perform a physical exam. A sample of fluid may also be taken from the abscess to determine what is causing your infection. TREATMENT  Your caregiver may prescribe antibiotic medicines to fight the infection. However, taking antibiotics alone usually does not cure an abscess. Your caregiver may need to make a small cut (incision) in the abscess to drain the pus. In some cases, gauze is packed into the abscess to reduce pain and to continue draining the area. HOME CARE INSTRUCTIONS   Only take over-the-counter or prescription medicines for pain, discomfort, or fever as directed by your caregiver.  If you were prescribed antibiotics, take them as directed. Finish them even if you start to feel better.  If gauze is used, follow your caregiver's directions for changing the gauze.  To avoid spreading the infection:.  Wash your hands well.  Do not share personal care  items, towels, or whirlpools with others.  Avoid skin contact with others.  Keep your skin and clothes clean around the abscess.  Keep all follow-up appointments as directed by your caregiver. SEEK MEDICAL CARE IF:   You have increased pain, swelling, redness, fluid drainage, or bleeding.  You have muscle aches, chills, or a general ill feeling.  You have a fever. MAKE SURE YOU:   Understand these instructions.  Will watch your condition.  Will get help right away if you are not doing well or get worse.   This information is not intended to replace advice given to you by your health care provider. Make sure you discuss any questions you have with your health care provider.   Document Released: 11/01/2004 Document Revised: 07/24/2011 Document Reviewed: 04/06/2011 Elsevier Interactive Patient Education [employer].

## 2015-07-21 NOTE — Progress Notes (Signed)
Patient ID: Anna Martinez, female   DOB: 1960-09-09, 55 y.o.   MRN: [id number] GYNECOLOGY  VISIT   HPI: 55 y.o.   Single  Caucasian  female   G1P1 with Patient's last menstrual period was 07/21/2015.(the patient is on cyclic HRT and is having light bleeding monthly). Here c/o boil on right vulvar X 3 days. She may have had very small boils in the past, but never this bad. She did squeeze the boil and get some pus out of it. It has gotten worse since then. No fevers or flu like symptoms.  GYNECOLOGIC HISTORY: Patient's last menstrual period was 07/21/2015. Contraception:postmenopause Menopausal hormone therapy: none         OB History    Gravida Para Term Preterm AB TAB SAB Ectopic Multiple Living   1 1        1          Patient Active Problem List   Diagnosis Date Noted  . Morbid obesity ([place]) 08/21/2014  . Postmenopausal HRT (hormone replacement therapy) 08/21/2014  . 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 Depression   . Hypertension   . Obesity   . 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 Sleep apnea     using c-pap machine    Past Surgical History  Procedure Laterality Date  . Laparoscopy cholectomy  2001    Current Outpatient Prescriptions  Medication Sig Dispense Refill  . atenolol (TENORMIN) 25 MG tablet     . estradiol (ESTRACE) 0.1 MG/GM vaginal cream 1 gram vaginally twice weekly 42.5 g 2  . estradiol (ESTRACE) 1 MG tablet Take 1 tablet (1 mg total) by mouth daily. 30 tablet 12  . losartan-hydrochlorothiazide (HYZAAR) 100-25 MG per tablet     . nystatin cream (MYCOSTATIN) Apply 1 application topically 2 (two) times daily. Apply to affected area BID for up to 7 days. 30 g 2  . progesterone (PROMETRIUM) 100 MG capsule 1 po daily on days 1-15 of each month 15 capsule 10   No current facility-administered medications for this visit.     ALLERGIES:  Penicillins  Family History  Problem Relation Age of Onset  . Hypertension Mother   . Hypertension Father   . Cancer Father     prostate    Social History   Social History  . Marital Status: Single    Spouse Name: N/A  . Number of Children: N/A  . Years of Education: N/A   Occupational History  . Not on file.   Social History Main Topics  . Smoking status: Never Smoker   . Smokeless tobacco: Never Used  . Alcohol Use: No  . Drug Use: No  . Sexual Activity: Yes   Other Topics Concern  . Not on file   Social History Narrative    Review of Systems  Constitutional: Negative.   HENT: Negative.   Eyes: Negative.   Respiratory: Negative.   Cardiovascular: Negative.   Gastrointestinal: Negative.   Genitourinary:       Boil on vulvar  Musculoskeletal: Negative.   Skin: Negative.   Neurological: Negative.   Endo/Heme/Allergies: Negative.   Psychiatric/Behavioral: Negative.     PHYSICAL EXAMINATION:    BP 110/70 mmHg  Pulse 60  Resp 16  Wt 336 lb (152.409 kg)  LMP 07/21/2015    General appearance: alert, cooperative and appears stated age  Pelvic: External  genitalia:  no lesions. She has a vulvar abscess, coming to a point, not draining with pressure. Area of induration is over 5 cm, hard to tell how much is fluctuant. The abscess is erythematous              Urethra:  normal appearing urethra with no masses, tenderness or lesions              Bartholins and Skenes: normal     Procedure: risks reviewed and verbal consent was obtained The area was cleansed with betadine and injected with 1 % lidocaine. It was incised with a #11 blade, a large amount of pus drained. Still very indurated               Chaperone was present for exam.  ASSESSMENT Large vulvar abscess    PLAN I&D Will treat with antibiotics Warm compresses, hot soaks for pain F/u on Monday  Call with worsening symptoms, or any concerns    An After Visit Summary was printed and given to  the patient.

## 2015-07-21 NOTE — Addendum Note (Signed)
Addended by: Dorothy Spark on: 07/21/2015 04:47 PM   Modules accepted: Orders

## 2015-07-24 LAB — WOUND CULTURE
Gram Stain: NONE SEEN
Gram Stain: NONE SEEN

## 2015-07-25 ENCOUNTER — Ambulatory Visit: Payer: BC Managed Care – PPO | Admitting: Obstetrics and Gynecology

## 2015-07-25 NOTE — Telephone Encounter (Signed)
I called patient to reschedule her 3 day reck due to surgery delay with Dr.Jertson. Patient said she is feeling much better and really doesn't feel that she needs the appointment.

## 2015-07-28 NOTE — Telephone Encounter (Signed)
Ok to close encounter. 

## 2015-07-28 NOTE — Telephone Encounter (Signed)
Routing to Dr. Quincy Simmonds to review in Dr. Gentry Fitz absence. Okay to close?

## 2015-07-29 ENCOUNTER — Telehealth: Payer: Self-pay | Admitting: Obstetrics and Gynecology

## 2015-07-29 NOTE — Telephone Encounter (Signed)
Patient called states she was prescribed Bactrim by Dr Talbert Nan. Patient states she completed taking medication on Wednesday night, 07/27/15. Patient states she  has developed a mild itchy rash all over her body today. She denies any trouble breathing. Advised will forward to clinical/triage. Patient is agreeable.  Routing to triage

## 2015-07-29 NOTE — Telephone Encounter (Signed)
Return call to patient. She states she completed her entire course of Bactrim and now has developed a light red bumpy rash on her abdomen, upper thighs and arms. She states that it is slightly itchy. Denies any SOB or throat swelling.  Discussed potential delayed allergy reaction to Bactrim. Advised if worsens seek care at urgent care, PCP or emergency room. Advised benadryl at night time or Claritin during the day according to OTC instructions.  Advised patient to list this on her allergies when asked and added Bactrim Allergy to her chart.  Advised to call back with any questions or concerns. She states her abscess has healed and declines office visit follow up.   Routing to Dr. Quincy Simmonds as covering physician. Okay as instructed?

## 2015-07-29 NOTE — Telephone Encounter (Signed)
Noted message from Dr. Silva, will close encounter.   

## 2015-07-29 NOTE — Telephone Encounter (Signed)
I agree with your recommendations.  You may close the encounter.  

## 2015-08-26 ENCOUNTER — Other Ambulatory Visit: Payer: Self-pay | Admitting: Obstetrics & Gynecology

## 2015-08-29 NOTE — Telephone Encounter (Signed)
Medication refill request: Estradiol 1mg  Last AEX:  10/19/14 SM Next AEX: 11/29/15  Last MMG (if hormonal medication request): 09/16/13 BIRADS1 negative Refill authorized: 10/19/14 #30 w/12 refills; today #30w/3 refills?

## 2015-10-04 ENCOUNTER — Telehealth: Payer: Self-pay | Admitting: *Deleted

## 2015-10-04 NOTE — Telephone Encounter (Signed)
Returned call to patient. Patient states that she and Dr. Sabra Heck had discussed switching to a combination hormone pill that would allow her to only have to take the medicine once per day and a lower dose of progesterone. Patient wanting to check with Dr. Sabra Heck to see if she recommends the switch. RN advised this message would be sent to Dr. Sabra Heck for review and our office would be in touch once she responds. Patient agreeable. Patient aware Dr. Sabra Heck is seeing patient's today and a response might not be immediate.   Routing to provider for review.

## 2015-10-04 NOTE — Telephone Encounter (Signed)
Patient calling requesting a switch on hormone Rxs. She wants a lower dose.

## 2015-10-05 ENCOUNTER — Other Ambulatory Visit: Payer: Self-pay | Admitting: Obstetrics & Gynecology

## 2015-10-05 MED ORDER — ESTRADIOL-NORETHINDRONE ACET 1-0.5 MG PO TABS: 1.0000 | ORAL_TABLET | Freq: Every day | ORAL | 1 refills | Status: DC

## 2015-10-05 NOTE — Telephone Encounter (Signed)
As long as she is not having any bleeding, ok to switch to Activella 1.0/0.5mg  (generic ok).  She will take one tablet orally daily.  Ok to do 30 or 90 days supply at pt desires.

## 2015-10-05 NOTE — Telephone Encounter (Signed)
Patient notified & aware to call if any bleeding.

## 2015-10-05 NOTE — Telephone Encounter (Signed)
Spoke to patient. Pt states she only has a tiny bit of spotting while on the progesterone. Pt asking if it would still be okay for her to switch to the activella 1.0/0.5. If so, pt would like a 30 day supply with refills for 86yr sent to pharmacy with a note placed in the comment section of rx stating pt would like 90 day supply with 4 refills if insurance allows. Please advise.

## 2015-10-05 NOTE — Telephone Encounter (Signed)
I think it will be fine but if she has spotting after the first month of switching, she needs to let me know.  Order for new rx sent to her pharmacy.  Thanks.

## 2015-10-18 ENCOUNTER — Telehealth: Payer: Self-pay | Admitting: Obstetrics & Gynecology

## 2015-10-18 NOTE — Telephone Encounter (Signed)
Patient is calling regarding some concerns that she has with her estrogen and progesterone prescription.

## 2015-10-19 NOTE — Telephone Encounter (Signed)
I would have the patient come into see Dr Sabra Heck.

## 2015-10-19 NOTE — Telephone Encounter (Signed)
Spoke with patient. Patient is currently taking Activella 1-0.5 mg tablet daily. Patient was started on this on 10/04/2015 (please see telephone encounter dated 10/04/2015). Patient was previously on Estrace 1 mg daily and Prometrium 100 mg daily. Reports yesterday she began having "moderate" bleeding like a cycle. Bleeding has lessoned today to spotting/mild bleeding. Patient denies missing any doses of her Activella. Denies any pelvic pain or cramping. Patient is concerned due to the increased amount of bleeding she had yesterday. Denies any pelvic pain. Asking if she may continue taking Activella at this time or if she needs to stop. Advised I will speak with the covering provider as Dr.Miller is out of the office today and return call with further recommendations. She is agreeable.  Cc: Dr.Miller

## 2015-10-19 NOTE — Telephone Encounter (Signed)
Spoke with patient. Advised as seen below per Dr. Talbert Nan. Patient scheduled with Dr. Sabra Heck, 10/20/15 at 4pm. Patient agreeable to date and time. Patient request that if reviewed by Dr. Sabra Heck and no appointment needed, please call to cancel.   CC: Dr. Sabra Heck

## 2015-10-20 ENCOUNTER — Ambulatory Visit (INDEPENDENT_AMBULATORY_CARE_PROVIDER_SITE_OTHER): Payer: BC Managed Care – PPO | Admitting: Obstetrics & Gynecology

## 2015-10-20 ENCOUNTER — Ambulatory Visit (INDEPENDENT_AMBULATORY_CARE_PROVIDER_SITE_OTHER): Payer: BC Managed Care – PPO

## 2015-10-20 VITALS — BP 136/86 | HR 88 | Temp 98.1°F | Resp 16 | Ht 68.5 in | Wt 334.0 lb

## 2015-10-20 DIAGNOSIS — Z1211 Encounter for screening for malignant neoplasm of colon: Secondary | ICD-10-CM

## 2015-10-20 DIAGNOSIS — Z79890 Hormone replacement therapy (postmenopausal): Secondary | ICD-10-CM

## 2015-10-20 DIAGNOSIS — N950 Postmenopausal bleeding: Secondary | ICD-10-CM

## 2015-10-20 DIAGNOSIS — R1032 Left lower quadrant pain: Secondary | ICD-10-CM

## 2015-10-20 DIAGNOSIS — R1031 Right lower quadrant pain: Secondary | ICD-10-CM

## 2015-10-20 NOTE — Progress Notes (Signed)
GYNECOLOGY  VISIT   HPI: 55 y.o. G1P1 Single Caucasian female with another episode of bleeding with her HRT use.  [place] 8/16 before she started HRT was 35.  She was quite symptomatic from hot flash standpoint when she was started on HRT.  Has done well with hot flash control but has experienced intermittent bleeding.  As well, she has not been tolerant of Prometrium as it caused swelling in her LE.  Current bleeding was proceeded by some cramping that has been on her left side.  This has continued with the bleeding.  No nausea, vomiting, constipation, or urinary symptoms.  Reports this has been intermittent pain in the past.  She does have some anxiety about this.  Has not had a colonoscopy.  States "I told myself I would do it at age 71 and I'm already passed that."  Today she asks if she should just "stop all of this".  Likely her bleeding would resolve and we discussed that but just a few months ago, in June, when she was here she asked to increase her HRT.  So, I think her symptoms are going to be significant.  She states she would like to consider and I will support her decision if she'd like to stop.  I proposed possibly decreasing dose in half, first, to see how symptomatic she will be at half dosage before stopping completely.  Still, I think she should proceed with endometrial biopsy due to her bleeding and obesity.  She is in agreement with plan.  GYNECOLOGIC HISTORY: Patient's last menstrual period was 07/21/2015. Contraception: Post menopausal  Menopausal hormone therapy: Activella, estrace vaginal cream.   Patient Active Problem List   Diagnosis Date Noted  . Morbid obesity ([place]) 08/21/2014  . Postmenopausal HRT (hormone replacement therapy) 08/21/2014  . LGSIL (low grade squamous intraepithelial dysplasia) 09/04/2013    Past Medical History:  Diagnosis Date  . 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 Depression   . Hypertension   . Obesity    . Sleep apnea    using c-pap machine    Past Surgical History:  Procedure Laterality Date  . laparoscopy cholectomy  2001    MEDS:  Reviewed in EPIC and UTD  ALLERGIES: Bactrim [sulfamethoxazole-trimethoprim] and Penicillins  Family History  Problem Relation Age of Onset  . Hypertension Mother   . Hypertension Father   . Cancer Father     prostate   SH:  Single, non smoker  Review of Systems  All other systems reviewed and are negative.   PHYSICAL EXAMINATION:    BP 136/86 (BP Location: Right Arm, Patient Position: Sitting, Cuff Size: Large)   Pulse 88   Temp 98.1 F (36.7 C) (Oral)   Resp 16   Ht 5' 8.5" (1.74 m)   Wt (!) 334 lb (151.5 kg)   LMP 07/21/2015   BMI 50.05 kg/m     General appearance: alert, cooperative and appears stated age Abdomen: soft, non-tender; bowel sounds normal; no masses,  no organomegaly  Pelvic: External genitalia:  no lesions              Urethra:  normal appearing urethra with no masses, tenderness or lesions              Bartholins and Skenes: normal                 Vagina: normal appearing vagina with normal color and discharge, no lesions  Cervix: no lesions              Bimanual Exam:  Uterus:  normal size, contour, position, consistency, mobility, non-tender              Adnexa: no mass, fullness, tenderness              Anus:  normal sphincter tone, no lesions  Endometrial biopsy recommended.  Discussed with patient.  Verbal and written consent obtained.   Procedure:  Speculum placed.  Cervix visualized and cleansed with betadine prep.  A single toothed tenaculum was applied to the anterior lip of the cervix.  Endometrial pipelle was advanced through the cervix into the endometrial cavity without difficulty.  Pipelle passed to 8cm.  Suction applied and pipelle removed with good tissue sample obtained.  Tenculum removed.  No bleeding noted.  Patient tolerated procedure well.  Chaperone was present for exam.  D/W pt  proceeding with ultrasound.  This could be done while she was in the office. Uterus:  9.5 x 5.1 x 4.9cm without fibroids Endometrium:  3.25mm Adnexa:  Left ovary 2.4 x 1.3 x 1.1cm     Right ovary 2.2 x 1.1 x 1.2cm Cul de sac:  Neg  Assessment: PMP bleeding, on HRT Intolerant to Prometrium due to LE swelling Obesity Hypertension LLQ pain, likely not of gyn origin  Plan: Biopsy pending.  Pt will be called with results.  She is contemplating stopping HRT.  I would encourage her to go to 1/2 dosage first before fully stopping her HRT.   Referral for screening colonoscopy will be made for pt as well.  ~30 minutes spent with patient >50% of time was in face to face discussion of above.  This was in addition to her exam and endometrial biopsy.

## 2015-10-20 NOTE — Patient Instructions (Signed)
Endometrial Biopsy Endometrial biopsy is a procedure in which a tissue sample is taken from inside the uterus. The tissue sample is then looked at under a microscope to see if the tissue is normal or abnormal. The endometrium is the lining of the uterus. This procedure helps determine where you are in your menstrual cycle and how hormone levels are affecting the lining of the uterus. This procedure may also be used to evaluate uterine bleeding or to diagnose endometrial cancer, tuberculosis, polyps, or inflammatory conditions.  LET YOUR HEALTH CARE PROVIDER KNOW ABOUT:  Any allergies you have.  All medicines you are taking, including vitamins, herbs, eye drops, creams, and over-the-counter medicines.  Previous problems you or members of your family have had with the use of anesthetics.  Any blood disorders you have.  Previous surgeries you have had.  Medical conditions you have.  Possibility of pregnancy. RISKS AND COMPLICATIONS Generally, this is a safe procedure. However, as with any procedure, complications can occur. Possible complications include:  Bleeding.  Pelvic infection.  Puncture of the uterine wall with the biopsy device (rare). BEFORE THE PROCEDURE   Keep a record of your menstrual cycles as directed by your health care provider. You may need to schedule your procedure for a specific time in your cycle.  You may want to bring a sanitary pad to wear home after the procedure.  Arrange for someone to drive you home after the procedure if you will be given a medicine to help you relax (sedative). PROCEDURE   You may be given a sedative to relax you.  You will lie on an exam table with your feet and legs supported as in a pelvic exam.  Your health care provider will insert an instrument (speculum) into your vagina to see your cervix.  Your cervix will be cleansed with an antiseptic solution. A medicine (local anesthetic) will be used to numb the cervix.  A forceps  instrument (tenaculum) will be used to hold your cervix steady for the biopsy.  A thin, rodlike instrument (uterine sound) will be inserted through your cervix to determine the length of your uterus and the location where the biopsy sample will be removed.  A thin, flexible tube (catheter) will be inserted through your cervix and into the uterus. The catheter is used to collect the biopsy sample from your endometrial tissue.  The catheter and speculum will then be removed, and the tissue sample will be sent to a lab for examination. AFTER THE PROCEDURE  You will rest in a recovery area until you are ready to go home.  You may have mild cramping and a small amount of vaginal bleeding for a few days after the procedure. This is normal.  Make sure you find out how to get your test results.   This information is not intended to replace advice given to you by your health care provider. Make sure you discuss any questions you have with your health care provider.   Document Released: 05/25/2004 Document Revised: 09/24/2012 Document Reviewed: 07/09/2012 Elsevier Interactive Patient Education 2016 [employer]  

## 2015-10-23 ENCOUNTER — Encounter: Payer: Self-pay | Admitting: Obstetrics & Gynecology

## 2015-10-26 ENCOUNTER — Telehealth: Payer: Self-pay | Admitting: *Deleted

## 2015-10-26 NOTE — Telephone Encounter (Signed)
Spoke with patient, advised of results as seen below by Dr. Sabra Heck. Patient is agreeable.   Routing to provider for final review. Patient is agreeable to disposition. Will close encounter.

## 2015-10-26 NOTE — Telephone Encounter (Signed)
-----   Message from Megan Salon, MD sent at 10/26/2015  5:26 AM EDT ----- Please let pt know her biopsy was negative for abnormal cells.  It think she is thinking about stopping her HRT.  I would highly recommended that she just start by cutting her activella in 1/2 and taking this for at least 4-6 weeks before going down any further.

## 2015-11-03 ENCOUNTER — Telehealth: Payer: Self-pay | Admitting: Obstetrics & Gynecology

## 2015-11-03 NOTE — Telephone Encounter (Signed)
Spoke with patient. Patient currently on HRT. Patient reports her bleeding has increased and she would like to "tapper off of the HRT faster". Patient reports spotting Tues and Wed with "heavier" bleeding today; reports changing pad q6h. Patient states she started the taper 2 days ago; taking half of tab. Advised of Dr. Sanjuan Dame previous recommendations as seen below. Patient states she does not want to wait that long, what is safe amount of time. Advised can review with Dr. Sabra Heck and return call with recommendations. Patient is agreeable.     Dr. Sabra Heck, any additional recommendations?    Notes Recorded by Megan Salon, MD on 10/26/2015 at 5:26 AM EDT Please let pt know her biopsy was negative for abnormal cells. It think she is thinking about stopping her HRT. I would highly recommended that she just start by cutting her activella in 1/2 and taking this for at least 4-6 weeks before going down any further.

## 2015-11-03 NOTE — Telephone Encounter (Signed)
It is not "unsafe" to just stop the HRT.  She will likely start having hot flashes again but we can deal with that other ways if they are significant.  Ok to just stop.

## 2015-11-03 NOTE — Telephone Encounter (Signed)
Patient is having bleeding again with the estrogen and wants to know if she can taper off of it.

## 2015-11-03 NOTE — Telephone Encounter (Signed)
Spoke with patient. Advised of Dr. Sanjuan Dame recommendations as seen below. Patient verbalizes understanding and is agreeable. Advised to call office with any additional questions/concerns.   Routing to provider for final review. Patient is agreeable to disposition. Will close encounter.

## 2015-11-28 ENCOUNTER — Telehealth: Payer: Self-pay | Admitting: Obstetrics & Gynecology

## 2015-11-28 NOTE — Telephone Encounter (Signed)
Call to patient to notify attempt to schedule with Dr Lorie Apley office. Their office also contacted patient with voicemail and no return call to date. Left voicemail to return call for their information so she may call to schedule her appointment. Message states she is able to ask for Merna or Deloris Ping for this information. Notes in referral.

## 2015-11-29 ENCOUNTER — Encounter: Payer: Self-pay | Admitting: Obstetrics & Gynecology

## 2015-11-29 ENCOUNTER — Ambulatory Visit (INDEPENDENT_AMBULATORY_CARE_PROVIDER_SITE_OTHER): Payer: BC Managed Care – PPO | Admitting: Obstetrics & Gynecology

## 2015-11-29 VITALS — BP 110/70 | HR 72 | Resp 16 | Ht 69.0 in | Wt 331.2 lb

## 2015-11-29 DIAGNOSIS — Z01419 Encounter for gynecological examination (general) (routine) without abnormal findings: Secondary | ICD-10-CM

## 2015-11-29 DIAGNOSIS — I10 Essential (primary) hypertension: Secondary | ICD-10-CM | POA: Diagnosis not present

## 2015-11-29 DIAGNOSIS — N393 Stress incontinence (female) (male): Secondary | ICD-10-CM | POA: Diagnosis not present

## 2015-11-29 NOTE — Progress Notes (Signed)
55 y.o. G1P1 SingleCaucasianF here for annual exam.  Off HRT.   Her ankle swelling is better.  She is having some hot flashes and night sweats.  Still with significant other.  No bleeding since stopping her HRT.  Pt reports increased issues with urinary leakage with coughing, lifting, straining and even occasionally with intercourse.    Together with same boyfriend.  Declines additional STD testing.  States not needed.  Patient's last menstrual period was 07/21/2015.          Sexually active: Yes.    The current method of family planning is none.    Exercising: No.  The patient does not participate in regular exercise at present. Smoker:  no  Health Maintenance: Pap:  09/04/13 negative, HR HPV negative  History of abnormal Pap:  yes MMG:  09/07/13 BIRADS 1 negative  Colonoscopy:  Never.  Pt is considering doing this.  Knows I would like her to proceed.  D/W pt Cologuard testing.  States she would rather do a colonoscopy. BMD:   never TDaP:  10/19/14  Pneumonia vaccine(s):  never Zostavax:   never Hep C testing: declined Screening Labs: PCP, Hb today: PCP, Urine today: PCP   reports that she has never smoked. She has never used smokeless tobacco. She reports that she does not drink alcohol or use drugs.  Past Medical History:  Diagnosis Date  . 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 Depression   . Hypertension   . Obesity   . Sleep apnea    using c-pap machine    Past Surgical History:  Procedure Laterality Date  . laparoscopy cholectomy  2001    Current Outpatient Prescriptions  Medication Sig Dispense Refill  . buPROPion (WELLBUTRIN XL) 150 MG 24 hr tablet Take 1 tablet by mouth daily.  11  . ESTRACE VAGINAL 0.1 MG/GM vaginal cream as needed.  2  . losartan-hydrochlorothiazide (HYZAAR) 100-25 MG per tablet     . nystatin cream (MYCOSTATIN) Apply 1 application topically 2 (two) times daily. Apply to affected area BID for up to 7 days.  30 g 2   No current facility-administered medications for this visit.     Family History  Problem Relation Age of Onset  . Hypertension Mother   . Hypertension Father   . Cancer Father     prostate    ROS:  Pertinent items are noted in HPI.  Otherwise, a comprehensive ROS was negative.  Exam:   BP 110/70 (BP Location: Right Arm, Patient Position: Sitting, Cuff Size: Large)   Pulse 72   Resp 16   Ht 5\' 9"  (1.753 m)   Wt (!) 331 lb 3.2 oz (150.2 kg)   LMP 07/21/2015   BMI 48.91 kg/m     Height: 5\' 9"  (175.3 cm)  Ht Readings from Last 3 Encounters:  11/29/15 5\' 9"  (1.753 m)  10/20/15 5' 8.5" (1.74 m)  03/18/15 5' 8.5" (1.74 m)    General appearance: alert, cooperative and appears stated age Head: Normocephalic, without obvious abnormality, atraumatic Neck: no adenopathy, supple, symmetrical, trachea midline and thyroid normal to inspection and palpation Lungs: clear to auscultation bilaterally Breasts: normal appearance, no masses or tenderness Heart: regular rate and rhythm Abdomen: soft, non-tender; bowel sounds normal; no masses,  no organomegaly Extremities: extremities normal, atraumatic, no cyanosis or edema Skin: Skin color, texture, turgor normal. No rashes or lesions Lymph nodes: Cervical, supraclavicular, and axillary nodes normal. No abnormal inguinal  nodes palpated Neurologic: Grossly normal   Pelvic: External genitalia:  no lesions              Urethra:  normal appearing urethra with no masses, tenderness or lesions              Bartholins and Skenes: normal                 Vagina: normal appearing vagina with normal color and discharge, no lesions              Cervix: no lesions              Pap taken: No. Bimanual Exam:  Uterus:  normal size, contour, position, consistency, mobility, non-tender              Adnexa: normal adnexa and no mass, fullness, tenderness               Rectovaginal: Confirms               Anus:  normal sphincter tone, no  lesions  Chaperone was present for exam.  A:  Well Woman with normal exam PMP, on HRT Hypertension H/O LGILS pap with neg HR HPV 2014, Colposcopy 2014 with Dr. Joan Flores Obesity SUI  P:         Mammogram yearly.  We will schedule for pt. Pap not obtained today.  Neg pap and neg HR HPV 2015 and 2016.   Referral to Ileana Roup at [facility] for pelvic PT Return annually or prn

## 2015-11-30 NOTE — Progress Notes (Signed)
[employer] Made Screening MMG appt for pt 12/07/15 @4 :00pm patient notified. Agreed with day and time.

## 2015-12-20 ENCOUNTER — Encounter: Payer: Self-pay | Admitting: Obstetrics & Gynecology

## 2015-12-20 NOTE — Telephone Encounter (Addendum)
Spoke with patient. Patient states that she increased her dose of Wellbutrin after her aex on 11/29/2015 with Dr.Miller. Is currently taking Wellbutrin 300 mg daily. Since increasing her dosage is experiencing dizziness. Denies any other symptoms. Reports she was not having these symptoms while taking Wellbutrin 150 mg. Reviewed with Dr.Jertson. Patient may reduce Wellbutrin back to 150 mg daily. If dizziness persist or develops any new symptoms she will need to be seen for further evaluation. Patient is agreeable. Advised if this occurs over night will need to be seen at a local urgent care or ER. Patient is agreeable. Also reviewed message from Theresia Lo as seen below. Patient states that she does not wish to proceed with appointment to see Dr.Mann at this time. Declines information to schedule appointment.  Cc: Theresia Lo  Dr.Jertson, do you agree with recommendations?

## 2015-12-20 NOTE — Telephone Encounter (Signed)
Patient called and says she is having some dizziness.  Says she recently went up to 300mg  of Wellbutrin.  Wants to know if she can go back to 150 and dose she need to taper.

## 2015-12-21 NOTE — Telephone Encounter (Signed)
Left message to call Shyler Hamill at [phone]. 

## 2015-12-21 NOTE — Telephone Encounter (Signed)
Please call and check on the patient today, if she isn't feeling better she should be seen by her primary MD

## 2015-12-26 NOTE — Telephone Encounter (Signed)
Left message to call Shown Dissinger at [phone]. 

## 2015-12-28 NOTE — Telephone Encounter (Signed)
Will close encounter

## 2015-12-28 NOTE — Telephone Encounter (Signed)
Dr.Jertson, I have attempted to reach this patient x 2 with no return call. Okay to close encounter? 

## 2016-05-11 ENCOUNTER — Other Ambulatory Visit: Payer: Self-pay | Admitting: Obstetrics & Gynecology

## 2016-05-11 NOTE — Telephone Encounter (Signed)
Medication refill request: Nystatin Cream Last AEX:  11/29/15 SM Next AEX: 03/15/17 SM Last MMG (if hormonal medication request): 12/07/15 BIRADS1, Density A, [place] Refill authorized: 10/19/14 #30g 2R. Please advise. Thank you.

## 2017-03-15 ENCOUNTER — Ambulatory Visit: Payer: BC Managed Care – PPO | Admitting: Obstetrics & Gynecology

## 2017-10-18 ENCOUNTER — Ambulatory Visit: Payer: BC Managed Care – PPO | Admitting: Obstetrics & Gynecology

## 2018-01-16 NOTE — Progress Notes (Deleted)
57 y.o. G1P1 Single White or Caucasian female here for annual exam.    Patient's last menstrual period was 07/21/2015.          Sexually active: {yes no:314532}  The current method of family planning is {contraception:315051}.    Exercising: {yes no:314532}  {types:19826} Smoker:  {YES NO:22349}  Health Maintenance: Pap: 10/19/14 Normal HR HPV Negative, 09/04/13 HR HPV Negative  History of abnormal Pap:  yes MMG:  12/07/15 Bi-rads 1 neg  Colonoscopy:  *** BMD:   NA TDaP:  10/19/14 Pneumonia vaccine(s):  *** Shingrix:   *** Hep C testing: *** Screening Labs: ***, Hb today: ***, Urine today: ***   reports that she has never smoked. She has never used smokeless tobacco. She reports that she does not drink alcohol or use drugs.  Past Medical History:  Diagnosis Date  . 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 Depression   . Hypertension   . Obesity   . Sleep apnea    using c-pap machine    Past Surgical History:  Procedure Laterality Date  . laparoscopy cholectomy  2001    Current Outpatient Medications  Medication Sig Dispense Refill  . buPROPion (WELLBUTRIN XL) 150 MG 24 hr tablet Take 1 tablet by mouth daily.  11  . ESTRACE VAGINAL 0.1 MG/GM vaginal cream as needed.  2  . losartan-hydrochlorothiazide (HYZAAR) 100-25 MG per tablet     . nystatin cream (MYCOSTATIN) APPLY TOPICALLY TO THE AFFECTED AREA TWICE DAILY FOR UP TO 7 DAYS 30 g 0   No current facility-administered medications for this visit.     Family History  Problem Relation Age of Onset  . Hypertension Mother   . Hypertension Father   . Cancer Father        prostate    Review of Systems  Exam:   LMP 07/21/2015   Height:      Ht Readings from Last 3 Encounters:  11/29/15 5\' 9"  (1.753 m)  10/20/15 5' 8.5" (1.74 m)  03/18/15 5' 8.5" (1.74 m)    General appearance: alert, cooperative and appears stated age Head: Normocephalic, without obvious abnormality,  atraumatic Neck: no adenopathy, supple, symmetrical, trachea midline and thyroid {EXAM; THYROID:18604} Lungs: clear to auscultation bilaterally Breasts: {Exam; breast:13139::"normal appearance, no masses or tenderness"} Heart: regular rate and rhythm Abdomen: soft, non-tender; bowel sounds normal; no masses,  no organomegaly Extremities: extremities normal, atraumatic, no cyanosis or edema Skin: Skin color, texture, turgor normal. No rashes or lesions Lymph nodes: Cervical, supraclavicular, and axillary nodes normal. No abnormal inguinal nodes palpated Neurologic: Grossly normal   Pelvic: External genitalia:  no lesions              Urethra:  normal appearing urethra with no masses, tenderness or lesions              Bartholins and Skenes: normal                 Vagina: normal appearing vagina with normal color and discharge, no lesions              Cervix: {exam; cervix:14595}              Pap taken: {yes no:314532} Bimanual Exam:  Uterus:  {exam; uterus:12215}              Adnexa: {exam; adnexa:12223}               Rectovaginal: Confirms  Anus:  normal sphincter tone, no lesions  Chaperone was present for exam.  A:  Well Woman with normal exam  P:   {plan; gyn:5269::"mammogram","pap smear","return annually or prn"}

## 2018-01-30 ENCOUNTER — Ambulatory Visit: Payer: BC Managed Care – PPO | Admitting: Obstetrics & Gynecology

## 2018-02-04 NOTE — Progress Notes (Signed)
57 y.o. G1P1 Single White or Caucasian female here for annual exam.  Had some issues with soreness and pain in her right knee.  Has been seen by ortho.  Has gained 20 pounds this past year and she feels this is contributing.  Feels she just needs to get herself motivated.    Retired 02/05/2018.  She is very excited about this.  She recognizes she will have more time to take care of herself.    Denies vaginal bleeding.    PCP: Dr. Justin Mend.  Lab work was done last year.  This was normal.    Patient's last menstrual period was 07/21/2015.          Sexually active: No.  The current method of family planning is post menopausal status.    Exercising: Yes.    The patient does not participate in regular exercise at present. Smoker:  no  Health Maintenance: Pap:  10/19/14 neg HR HPV Neg  09/04/13 negative, HR HPV negative  History of abnormal Pap:  yes MMG:11/17 neg Colonoscopy:  Declines.  Cologuard was discussed last year.  Not covered by insurance.   BMD:   never TDaP:  10/19/14 Pneumonia vaccine(s):  no Shingrix:  declines Hep C testing: done with school and was negative Screening Labs: PCP, Hb today: PCP, Urine today: yes   reports that she has never smoked. She has never used smokeless tobacco. She reports that she does not drink alcohol or use drugs.  Past Medical History:  Diagnosis Date  . 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 Depression   . Hypertension   . Obesity   . Sleep apnea    using c-pap machine    Past Surgical History:  Procedure Laterality Date  . laparoscopy cholectomy  2001    Current Outpatient Medications  Medication Sig Dispense Refill  . buPROPion (WELLBUTRIN XL) 150 MG 24 hr tablet Take 1 tablet by mouth daily.  11  . ESTRACE VAGINAL 0.1 MG/GM vaginal cream as needed.  2  . losartan-hydrochlorothiazide (HYZAAR) 100-25 MG per tablet     . nystatin cream (MYCOSTATIN) APPLY TOPICALLY TO THE AFFECTED AREA TWICE DAILY FOR  UP TO 7 DAYS 30 g 0   No current facility-administered medications for this visit.     Family History  Problem Relation Age of Onset  . Hypertension Mother   . Hypertension Father   . Cancer Father        prostate    Review of Systems  Genitourinary: Positive for dysuria and frequency.       Loss of urine with cough or sneeze    Exam:   BP 140/88   Pulse 72   Resp 14   Ht 5' 8.5" (1.74 m)   Wt (!) 351 lb (159.2 kg)   LMP 07/21/2015   BMI 52.59 kg/m    Height: 5' 8.5" (174 cm)  Ht Readings from Last 3 Encounters:  02/10/18 5' 8.5" (1.74 m)  11/29/15 5\' 9"  (1.753 m)  10/20/15 5' 8.5" (1.74 m)    General appearance: alert, cooperative and appears stated age Head: Normocephalic, without obvious abnormality, atraumatic Neck: no adenopathy, supple, symmetrical, trachea midline and thyroid normal to inspection and palpation Lungs: clear to auscultation bilaterally Breasts: normal appearance, no masses or tenderness Heart: regular rate and rhythm Abdomen: soft, non-tender; bowel sounds normal; no masses,  no organomegaly Extremities: extremities normal, atraumatic, no cyanosis or edema Skin: Skin color, texture,  turgor normal. No rashes or lesions Lymph nodes: Cervical, supraclavicular, and axillary nodes normal. No abnormal inguinal nodes palpated Neurologic: Grossly normal   Pelvic: External genitalia:  no lesions              Urethra:  normal appearing urethra with no masses, tenderness or lesions              Bartholins and Skenes: normal                 Vagina: normal appearing vagina with normal color and discharge, no lesions              Cervix: no lesions              Pap taken: No. Bimanual Exam:  Uterus:  normal size, contour, position, consistency, mobility, non-tender              Adnexa: normal adnexa and no mass, fullness, tenderness               Rectovaginal: Confirms               Anus:  normal sphincter tone, no lesions  Chaperone was present for  exam.  A:  Well Woman with normal exam PMP, no HRT Hypertension H/O LGSIL pap with neg HR HPV 2014, colposcopy 2014 with Dr. Joan Flores SUI OSA  P:   Mammogram guidelines reviewed.  Aware this is due.  States she will schedule Pap smear and HR HPV obtained today Lab work obtained with Dr. Justin Mend Declines colonoscopy and cologuard.  IFOB given today. Does not need RF for Estrace vaginal cream.  Will call if/when needs RF.  Did not go for PT referral but will consider again this year Return annually or prn

## 2018-02-10 ENCOUNTER — Ambulatory Visit: Payer: BC Managed Care – PPO | Admitting: Obstetrics & Gynecology

## 2018-02-10 ENCOUNTER — Other Ambulatory Visit (HOSPITAL_COMMUNITY)
Admission: RE | Admit: 2018-02-10 | Discharge: 2018-02-10 | Disposition: A | Discharge status: Home / Self Care | Payer: BC Managed Care – PPO | Source: Ambulatory Visit | Attending: Obstetrics & Gynecology | Admitting: Obstetrics & Gynecology

## 2018-02-10 ENCOUNTER — Encounter: Payer: Self-pay | Admitting: Obstetrics & Gynecology

## 2018-02-10 VITALS — BP 140/88 | HR 72 | Resp 14 | Ht 68.5 in | Wt 351.0 lb

## 2018-02-10 DIAGNOSIS — Z124 Encounter for screening for malignant neoplasm of cervix: Secondary | ICD-10-CM

## 2018-02-10 DIAGNOSIS — Z1211 Encounter for screening for malignant neoplasm of colon: Secondary | ICD-10-CM | POA: Diagnosis not present

## 2018-02-10 DIAGNOSIS — R350 Frequency of micturition: Secondary | ICD-10-CM

## 2018-02-10 DIAGNOSIS — Z01419 Encounter for gynecological examination (general) (routine) without abnormal findings: Secondary | ICD-10-CM

## 2018-02-10 DIAGNOSIS — G4733 Obstructive sleep apnea (adult) (pediatric): Secondary | ICD-10-CM

## 2018-02-10 DIAGNOSIS — G4730 Sleep apnea, unspecified: Secondary | ICD-10-CM | POA: Insufficient documentation

## 2018-02-10 LAB — POCT URINALYSIS DIPSTICK
Bilirubin, UA: NEGATIVE
Blood, UA: NEGATIVE
Glucose, UA: NEGATIVE
Ketones, UA: NEGATIVE
Leukocytes, UA: NEGATIVE
Nitrite, UA: NEGATIVE
Protein, UA: NEGATIVE
Urobilinogen, UA: NEGATIVE E.U./dL — AB
pH, UA: 5 (ref 5.0–8.0)

## 2018-02-10 NOTE — Patient Instructions (Signed)
Anna Martinez, bariatric physician.

## 2018-02-12 LAB — CYTOLOGY - PAP
Adequacy: ABSENT
Diagnosis: NEGATIVE
HPV: NOT DETECTED

## 2018-02-22 LAB — FECAL OCCULT BLOOD, IMMUNOCHEMICAL: Fecal Occult Bld: NEGATIVE

## 2019-01-05 ENCOUNTER — Other Ambulatory Visit: Payer: Self-pay

## 2019-01-05 DIAGNOSIS — Z20822 Contact with and (suspected) exposure to covid-19: Secondary | ICD-10-CM

## 2019-01-06 LAB — NOVEL CORONAVIRUS, NAA: SARS-CoV-2, NAA: NOT DETECTED

## 2019-04-08 ENCOUNTER — Other Ambulatory Visit: Payer: Self-pay

## 2019-04-09 ENCOUNTER — Encounter: Payer: Self-pay | Admitting: Obstetrics & Gynecology

## 2019-04-09 ENCOUNTER — Other Ambulatory Visit (HOSPITAL_COMMUNITY)
Admission: RE | Admit: 2019-04-09 | Discharge: 2019-04-09 | Disposition: A | Discharge status: Home / Self Care | Payer: BC Managed Care – PPO | Source: Ambulatory Visit | Attending: Obstetrics & Gynecology | Admitting: Obstetrics & Gynecology

## 2019-04-09 ENCOUNTER — Ambulatory Visit: Payer: BC Managed Care – PPO | Admitting: Obstetrics & Gynecology

## 2019-04-09 ENCOUNTER — Telehealth: Payer: Self-pay | Admitting: *Deleted

## 2019-04-09 VITALS — BP 168/82 | HR 68 | Temp 97.9°F | Resp 12 | Ht 69.0 in | Wt 349.0 lb

## 2019-04-09 DIAGNOSIS — L989 Disorder of the skin and subcutaneous tissue, unspecified: Secondary | ICD-10-CM

## 2019-04-09 DIAGNOSIS — L292 Pruritus vulvae: Secondary | ICD-10-CM

## 2019-04-09 MED ORDER — ESTRADIOL 0.1 MG/GM VA CREA: TOPICAL_CREAM | VAGINAL | 1 refills | Status: DC

## 2019-04-09 MED ORDER — CLOBETASOL PROPIONATE 0.05 % EX OINT: 1.0000 "application " | TOPICAL_OINTMENT | Freq: Two times a day (BID) | CUTANEOUS | 1 refills | Status: DC

## 2019-04-09 MED ORDER — ESTRACE 0.1 MG/GM VA CREA: TOPICAL_CREAM | VAGINAL | 1 refills | Status: DC

## 2019-04-09 NOTE — Telephone Encounter (Signed)
Rx signed.  Thanks for helping with the cost for this medication.

## 2019-04-09 NOTE — Telephone Encounter (Signed)
Spoke with patient. Patient is requesting alternative to Estrace vaginal cream due to cost.   Reviewed option of filling as generic, estradiol vaginal cream 0.1 mg, and using [facility]. Patient agreeable and request RX to [facility]. Pharmacy updated. Advised I will review with Dr. Sabra Heck and f/u, patient agreeable.   Rx pended.   Routing to Dr. Sabra Heck.

## 2019-04-09 NOTE — Progress Notes (Signed)
GYNECOLOGY  VISIT  CC:   Patient complains of having an irritation on the "upper vaginal lip" that has continued for months per patient.    HPI: 59 y.o. G1P1 Single White or Caucasian female here for vaginal irritation.  Reports she's been having itching and irritation for several months and she's not exactly sure when it began.  Does seem to bother her more after intercourse so she's been using the vaginal estrogen cream topically when it is bothersome.  This is helping her some but doesn't seem to "fix it".  She is living with her boyfriend now for about a year.  Denies vaginal discharge or vaginal bleeding.  GYNECOLOGIC HISTORY: Patient's last menstrual period was 07/21/2015. Contraception: Postmenopausal Menopausal hormone therapy: Estrace  Patient Active Problem List   Diagnosis Date Noted  . Sleep apnea   . Essential hypertension, benign 11/29/2015  . Morbid obesity ([place]) 08/21/2014    Past Medical History:  Diagnosis Date  . 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 Depression   . Hypertension   . Obesity   . Sleep apnea    using c-pap machine    Past Surgical History:  Procedure Laterality Date  . laparoscopy cholectomy  2001    MEDS:   Current Outpatient Medications on File Prior to Visit  Medication Sig Dispense Refill  . ESTRACE VAGINAL 0.1 MG/GM vaginal cream as needed.  2  . valsartan-hydrochlorothiazide (DIOVAN-HCT) 160-12.5 MG tablet Take 1 tablet by mouth daily.     No current facility-administered medications on file prior to visit.    ALLERGIES: Bactrim [sulfamethoxazole-trimethoprim] and Penicillins  Family History  Problem Relation Age of Onset  . Hypertension Mother   . Hypertension Father   . Cancer Father        prostate    SH:  Single, non smoker  Review of Systems  Genitourinary:       Vaginal irritation  All other systems reviewed and are negative.   PHYSICAL EXAMINATION:    BP (!) 168/82 (BP  Location: Right Arm, Patient Position: Sitting, Cuff Size: Large)   Pulse 68   Temp 97.9 F (36.6 C) (Temporal)   Resp 12   Ht 5\' 9"  (1.753 m)   Wt (!) 349 lb (158.3 kg)   LMP 07/21/2015   BMI 51.54 kg/m     Physical Exam  Constitutional: She is oriented to person, place, and time. She appears well-developed and well-nourished.  Genitourinary:     Neurological: She is alert and oriented to person, place, and time.  Skin: Skin is warm and dry.  Psychiatric: She has a normal mood and affect.    Procedure:  Area cleansed with Betadine.  Sterile technique used throughout procedure.  Skin anesthestized with Lidocaine 1% plain; 1.29mL.  4 punch biopsy used to obtain specimen.  Biopsy grasped with pick-ups and excised with scissors.  Adequate hemostasis obtained with silver nitrate sticks.  Dressing was not applied.  Pt tolerated procedure well.  Chaperone, Terence Lux, CMA, was present for exam.  Assessment: Vulvar itching Hypopigmented and thickened skin changes noted today on exam c/w lichen sclerosus  Plan: Biopsy results pending and will be called to pt Will start treatment with Clobetasol 0.05% ointment bid for 4 weeks and recheck after that time. RF for estrace vaginal cream also sent to pharmacy for pt.  She knows not to use this as she has been on the affected skin area and just to use  for vaginal/vulvar dryness.

## 2019-04-09 NOTE — Patient Instructions (Addendum)
[place]--lubricant   Lichen Sclerosus Lichen sclerosus is a skin problem. It can happen on any part of the body, but it commonly involves the anal or genital areas. It can cause itching and discomfort in these areas. Treatment can help to control symptoms. When the genital area is affected, getting treatment is important because the condition can cause scarring that may lead to other problems. What are the causes? The cause of this condition is not known. It may be related to an overactive immune system or a lack of certain hormones. Lichen sclerosus is not an infection or a fungus, and it is not passed from one person to another (not contagious). What increases the risk? This condition is more likely to develop in women, usually after menopause. What are the signs or symptoms? Symptoms of this condition include:  Thin, wrinkled, white areas on the skin.  Thickened white areas on the skin.  Red and swollen patches (lesions) on the skin.  Tears or cracks in the skin.  Bruising.  Blood blisters.  Severe itching.  Pain, itching, or burning when urinating. Constipation is also common in people with lichen sclerosus. How is this diagnosed? This condition may be diagnosed with a physical exam. In some cases, a tissue sample (biopsy sample) may be removed to be looked at under a microscope. How is this treated? This condition is usually treated with medicated creams or ointments (topical steroids) that are applied over the affected areas. In some cases, treatment may also include medicines that are taken by mouth. Surgery may be needed in more severe cases that are causing problems such as scarring. Follow these instructions at home:  Take or use over-the-counter and prescription medicines only as told by your health care provider.  Use creams or ointments as told by your health care provider.  Do not scratch the affected areas of skin.  If you are a woman, be sure to keep the  vaginal area as clean and dry as possible.  Clean the affected area of skin gently with water. Avoid using rough towels or toilet paper.  Keep all follow-up visits as told by your health care provider. This is important. Contact a health care provider if:  You have increasing redness, swelling, or pain in the affected area.  You have fluid, blood, or pus coming from the affected area.  You have new lesions on your skin.  You have a fever.  You have pain during sex. Summary  Lichen sclerosus is a skin problem. When the genital area is affected, getting treatment is important because the condition can cause scarring that may lead to other problems.  This condition is usually treated with medicated creams or ointments (topical steroids) that are applied over the affected areas.  Take or use over-the-counter and prescription medicines only as told by your health care provider.  Contact a health care provider if you have new lesions on your skin, have pain during sex, or have increasing redness, swelling, or pain in the affected area.  Keep all follow-up visits as told by your health care provider. This is important. This information is not intended to replace advice given to you by your health care provider. Make sure you discuss any questions you have with your health care provider. Document Revised: 06/06/2017 Document Reviewed: 06/06/2017 Elsevier Patient Education  [place].

## 2019-04-09 NOTE — Telephone Encounter (Signed)
Patient would like to speak with nurse about the estrace prescription and cost.

## 2019-04-10 LAB — SURGICAL PATHOLOGY

## 2019-04-10 NOTE — Telephone Encounter (Signed)
Spoke with patient, notified of Rx. Patient verbalizes understanding and is agreeable.   Encounter closed.

## 2019-04-29 NOTE — Progress Notes (Signed)
GYNECOLOGY  VISIT  CC:   Follow up of lichen sclerosus  HPI: 59 y.o. G1P1 Single White or Caucasian female here for follow up of itching lichen sclerosus that was biopsied on 3/4.  She has been using topical steroid ointment.  Itchiness is improved.  She is still having some mild irritation.  She has some questions for me today about her daughter who is coming for a new patient appt in about a month.  Denies vaginal bleeding.  Has an area on the vulva that seems to get irritated from time to time with intercourse that she would like for me to evaluate.    GYNECOLOGIC HISTORY: Patient's last menstrual period was 07/21/2015. Contraception: postmenopausal Menopausal hormone therapy: estrace vaginal cream  Patient Active Problem List   Diagnosis Date Noted  . Sleep apnea   . Essential hypertension, benign 11/29/2015  . Morbid obesity ([place]) 08/21/2014    Past Medical History:  Diagnosis Date  . 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 Depression   . Hypertension   . Obesity   . Sleep apnea    using c-pap machine    Past Surgical History:  Procedure Laterality Date  . laparoscopy cholectomy  2001    MEDS:   Current Outpatient Medications on File Prior to Visit  Medication Sig Dispense Refill  . clobetasol ointment (TEMOVATE) [id number] % Apply 1 application topically 2 (two) times daily. Apply as directed twice daily for 4 weeks 60 g 1  . estradiol (ESTRACE) 0.1 MG/GM vaginal cream Place 1 gram vaginally twice weekly as needed. 42.5 g 1  . valsartan-hydrochlorothiazide (DIOVAN-HCT) 160-12.5 MG tablet Take 1 tablet by mouth daily.     No current facility-administered medications on file prior to visit.    ALLERGIES: Bactrim [sulfamethoxazole-trimethoprim] and Penicillins  Family History  Problem Relation Age of Onset  . Hypertension Mother   . Hypertension Father   . Cancer Father        prostate    SH:  Single, non   Review of Systems   Constitutional: Negative.   HENT: Negative.   Eyes: Negative.   Respiratory: Negative.   Cardiovascular: Negative.   Gastrointestinal: Negative.   Endocrine: Negative.   Genitourinary: Negative.   Musculoskeletal: Negative.   Skin: Negative.   Allergic/Immunologic: Negative.   Neurological: Negative.   Psychiatric/Behavioral: Negative.     PHYSICAL EXAMINATION:    BP 140/90   Pulse 68   Temp 97.7 F (36.5 C) (Skin)   Resp 16   Wt (!) 348 lb (157.9 kg)   LMP 07/21/2015   BMI 51.39 kg/m     General appearance: alert, cooperative and appears stated age Lymph:  no inguinal LAD noted  Pelvic: External genitalia:  Hypopigmentation of inner labia major anteriorly around the clitoris, area of thickening has decreased, biopsy site improved, sebaceous cyst noted on inner left labia majora with mild erythema present today              Urethra:  normal appearing urethra with no masses, tenderness or lesions              Bartholins and Skenes: normal                 Anus:  Perianal skin is normal  Chaperone, Royal Hawthorn, CMA, was present for exam.  Assessment: Biopsy proven lichen sclerosus Vulvar sebaceous cyst causing irritation Hypertension  Plan: Continue topical clobetasol 0.05% ointment nightly.  Recheck 6 weeks. Pt desires removal of sebaceous cyst and she will have this done in six weeks.   ~20 minutes total time spent with pt.

## 2019-05-07 ENCOUNTER — Other Ambulatory Visit: Payer: Self-pay

## 2019-05-07 ENCOUNTER — Ambulatory Visit: Payer: Self-pay | Admitting: Obstetrics & Gynecology

## 2019-05-11 ENCOUNTER — Ambulatory Visit: Payer: BC Managed Care – PPO | Admitting: Obstetrics & Gynecology

## 2019-05-11 ENCOUNTER — Encounter: Payer: Self-pay | Admitting: Obstetrics & Gynecology

## 2019-05-11 ENCOUNTER — Other Ambulatory Visit: Payer: Self-pay

## 2019-05-11 VITALS — BP 140/90 | HR 68 | Temp 97.7°F | Resp 16 | Wt 348.0 lb

## 2019-05-11 DIAGNOSIS — L723 Sebaceous cyst: Secondary | ICD-10-CM | POA: Diagnosis not present

## 2019-05-11 DIAGNOSIS — L900 Lichen sclerosus et atrophicus: Secondary | ICD-10-CM | POA: Diagnosis not present

## 2019-06-12 ENCOUNTER — Ambulatory Visit: Payer: BC Managed Care – PPO | Admitting: Obstetrics & Gynecology

## 2019-06-19 ENCOUNTER — Other Ambulatory Visit: Payer: Self-pay

## 2019-06-22 ENCOUNTER — Encounter: Payer: Self-pay | Admitting: Obstetrics & Gynecology

## 2019-06-22 ENCOUNTER — Ambulatory Visit: Payer: BC Managed Care – PPO | Admitting: Obstetrics & Gynecology

## 2019-06-22 ENCOUNTER — Other Ambulatory Visit: Payer: Self-pay

## 2019-06-22 VITALS — BP 124/70 | HR 72 | Temp 98.1°F | Ht 69.5 in | Wt 350.0 lb

## 2019-06-22 DIAGNOSIS — L900 Lichen sclerosus et atrophicus: Secondary | ICD-10-CM

## 2019-06-22 MED ORDER — MOMETASONE FUROATE 0.1 % EX OINT: TOPICAL_OINTMENT | CUTANEOUS | 2 refills | Status: DC

## 2019-06-22 NOTE — Progress Notes (Signed)
GYNECOLOGY  VISIT  HPI: 59 y.o. G1P1 Single White or Caucasian female here for 6 week recheck. Reports vulvar itching/irritation is resolved.  Feels so much better.  Denies vaginal discharge or bleeding.  Does not have any skin irritation at this time.  she did have a sebaceous cyst at the last visit she was considering to have removed.  Patient does not wish to proceed with the sebaceous cyst removal at this time.  GYNECOLOGIC HISTORY: Patient's last menstrual period was 07/21/2015. Contraception: Postmenopausal Menopausal hormone therapy: Estrace vaginal cream  Patient Active Problem List   Diagnosis Date Noted  . Lichen sclerosus et atrophicus 05/11/2019  . Sleep apnea   . Essential hypertension, benign 11/29/2015  . Morbid obesity ([place]) 08/21/2014    Past Medical History:  Diagnosis Date  . 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 Depression   . Hypertension   . Obesity   . Sleep apnea    using c-pap machine    Past Surgical History:  Procedure Laterality Date  . laparoscopy cholectomy  2001    MEDS:   Current Outpatient Medications on File Prior to Visit  Medication Sig Dispense Refill  . clobetasol ointment (TEMOVATE) [id number] % Apply 1 application topically 2 (two) times daily. Apply as directed twice daily for 4 weeks 60 g 1  . estradiol (ESTRACE) 0.1 MG/GM vaginal cream Place 1 gram vaginally twice weekly as needed. 42.5 g 1  . valsartan-hydrochlorothiazide (DIOVAN-HCT) 160-12.5 MG tablet Take 1 tablet by mouth daily.     No current facility-administered medications on file prior to visit.    ALLERGIES: Bactrim [sulfamethoxazole-trimethoprim] and Penicillins  Family History  Problem Relation Age of Onset  . Hypertension Mother   . Hypertension Father   . Cancer Father        prostate    SH:  Single but in relationship, non smoker  Review of Systems  All other systems reviewed and are negative.   PHYSICAL EXAMINATION:     BP 124/70 (BP Location: Right Arm, Patient Position: Sitting, Cuff Size: Large)   Pulse 72   Temp 98.1 F (36.7 C) (Temporal)   Ht 5' 9.5" (1.765 m)   Wt (!) 350 lb (158.8 kg)   LMP 07/21/2015   BMI 50.94 kg/m     General appearance: alert, cooperative and appears stated age Lymph:  no inguinal LAD noted  Pelvic: External genitalia:  Significant improvement in hypopigmneted skin changes, tissue is almost completely normal except in inner labia majora but only superiorly              Urethra:  normal appearing urethra with no masses, tenderness or lesions              Bartholins and Skenes: normal                   Chaperone, Terence Lux, CMA, was present for exam.  Assessment: Lichen sclerosus, much improved  Plan: Pt is going to wean off the clobetasol and switch to mometasone ointment 0.1% twice weekly.  Aware can use clobetasol 0.05% ointment bid for up to 5 days with flare.  Also aware she should not use for more than 5 days each month and, if it, she is going to let me know.  Small risk of vulvar skin cancers reviewed as well as signs/symptoms.  Will plan to recheck yearly for now unless symptoms are not staying under control.  Also, common  triggers to increasing itchiness reviewed.  Changes suggested.   About 20 minutes spent in total with pt including counseling about LS&A

## 2019-11-13 ENCOUNTER — Encounter: Payer: Self-pay | Admitting: Obstetrics & Gynecology

## 2020-02-06 DIAGNOSIS — E119 Type 2 diabetes mellitus without complications: Secondary | ICD-10-CM

## 2020-02-06 HISTORY — DX: Type 2 diabetes mellitus without complications: E11.9

## 2020-03-24 ENCOUNTER — Ambulatory Visit (INDEPENDENT_AMBULATORY_CARE_PROVIDER_SITE_OTHER): Payer: BC Managed Care – PPO | Admitting: Obstetrics & Gynecology

## 2020-03-24 ENCOUNTER — Other Ambulatory Visit: Payer: Self-pay

## 2020-03-24 ENCOUNTER — Encounter (HOSPITAL_BASED_OUTPATIENT_CLINIC_OR_DEPARTMENT_OTHER): Payer: Self-pay | Admitting: Obstetrics & Gynecology

## 2020-03-24 VITALS — BP 142/84 | HR 60 | Resp 19 | Ht 68.75 in | Wt 354.4 lb

## 2020-03-24 DIAGNOSIS — Z1211 Encounter for screening for malignant neoplasm of colon: Secondary | ICD-10-CM

## 2020-03-24 DIAGNOSIS — R0683 Snoring: Secondary | ICD-10-CM | POA: Insufficient documentation

## 2020-03-24 DIAGNOSIS — Z8742 Personal history of other diseases of the female genital tract: Secondary | ICD-10-CM | POA: Diagnosis not present

## 2020-03-24 DIAGNOSIS — I10 Essential (primary) hypertension: Secondary | ICD-10-CM

## 2020-03-24 DIAGNOSIS — E785 Hyperlipidemia, unspecified: Secondary | ICD-10-CM | POA: Insufficient documentation

## 2020-03-24 DIAGNOSIS — Z01419 Encounter for gynecological examination (general) (routine) without abnormal findings: Secondary | ICD-10-CM

## 2020-03-24 DIAGNOSIS — F331 Major depressive disorder, recurrent, moderate: Secondary | ICD-10-CM | POA: Insufficient documentation

## 2020-03-24 DIAGNOSIS — Z6841 Body Mass Index (BMI) 40.0 and over, adult: Secondary | ICD-10-CM

## 2020-03-24 DIAGNOSIS — L900 Lichen sclerosus et atrophicus: Secondary | ICD-10-CM

## 2020-03-24 DIAGNOSIS — E7849 Other hyperlipidemia: Secondary | ICD-10-CM

## 2020-03-24 DIAGNOSIS — E559 Vitamin D deficiency, unspecified: Secondary | ICD-10-CM | POA: Insufficient documentation

## 2020-03-24 DIAGNOSIS — B372 Candidiasis of skin and nail: Secondary | ICD-10-CM

## 2020-03-24 DIAGNOSIS — E782 Mixed hyperlipidemia: Secondary | ICD-10-CM | POA: Insufficient documentation

## 2020-03-24 MED ORDER — NYSTATIN 100000 UNIT/GM EX CREA: 1.0000 "application " | TOPICAL_CREAM | Freq: Two times a day (BID) | CUTANEOUS | 0 refills | Status: DC

## 2020-03-24 MED ORDER — MOMETASONE FUROATE 0.1 % EX OINT
TOPICAL_OINTMENT | CUTANEOUS | 2 refills | Status: DC
Start: 2020-03-24 — End: 2021-01-23

## 2020-03-24 NOTE — Progress Notes (Signed)
60 y.o. G1P1 Single White or Caucasian female here for annual exam.  Doing well.  Denies vaginal bleeding.  Planning on seeing Dr. Justin Mend this year.  Will do blood work at that time.  Has felt some anxiety with Covid.  Has a new granddaughter.  This has made her worried about Covid exposure to her granddaughter.  She thinks this has impacted her choices at times.  She does not feel she needs to see a therapist at this time but states she will let me know if feels this changes.  Frustrated with weight.  Not exercising.  Does want to be in better shape.  Absolutely not interested in weight loss surgery.  Willing to participate in a program.  Patient's last menstrual period was 07/21/2015.          Sexually active: Yes.    The current method of family planning is post menopausal status.    Exercising: No.  Smoker:  no  Health Maintenance: Pap:  02/07/2018 neg with neg HR HPV History of abnormal Pap:  Yes, LGSIL in 2014 MMG:  2017 Colonoscopy:  Has never done one BMD:   Not indicated TDaP:  10/19/2014 Screening Labs: will do with Dr. Justin Mend   reports that she has never smoked. She has never used smokeless tobacco. She reports that she does not drink alcohol and does not use drugs.  Past Medical History:  Diagnosis Date  . 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 Depression   . Hypertension   . Obesity   . Sleep apnea    using c-pap machine    Past Surgical History:  Procedure Laterality Date  . laparoscopy cholectomy  2001    Current Outpatient Medications  Medication Sig Dispense Refill  . mometasone (ELOCON) 0.1 % ointment Apply topically as directed once or twice weekly 45 g 2  . valsartan-hydrochlorothiazide (DIOVAN-HCT) 160-12.5 MG tablet Take 1 tablet by mouth daily.    . clobetasol ointment (TEMOVATE) 7.78 % Apply 1 application topically 2 (two) times daily. Apply as directed twice daily for 4 weeks (Patient not taking: Reported on 03/24/2020)  60 g 1  . estradiol (ESTRACE) 0.1 MG/GM vaginal cream Place 1 gram vaginally twice weekly as needed. (Patient not taking: Reported on 03/24/2020) 42.5 g 1   No current facility-administered medications for this visit.    Family History  Problem Relation Age of Onset  . Hypertension Mother   . Hypertension Father   . Cancer Father        prostate    Review of Systems  All other systems reviewed and are negative.   Exam:   BP (!) 142/84   Pulse 60   Resp 19   Ht 5' 8.75" (1.746 m)   Wt (!) 354 lb 6.4 oz (160.8 kg)   LMP 07/21/2015   BMI 52.72 kg/m   Height: 5' 8.75" (174.6 cm)  General appearance: alert, cooperative and appears stated age Head: Normocephalic, without obvious abnormality, atraumatic Neck: no adenopathy, supple, symmetrical, trachea midline and thyroid normal to inspection and palpation Lungs: clear to auscultation bilaterally Breasts: normal appearance, no masses or tenderness Heart: regular rate and rhythm Abdomen: soft, non-tender; bowel sounds normal; no masses,  no organomegaly Extremities: extremities normal, atraumatic, no cyanosis or edema Skin: Skin color, texture, turgor normal. No rashes or lesions Lymph nodes: Cervical, supraclavicular, and axillary nodes normal. No abnormal inguinal nodes palpated Neurologic: Grossly normal   Pelvic: External genitalia:  no lesions, no hypopigmentation present on exam              Urethra:  normal appearing urethra with no masses, tenderness or lesions              Bartholins and Skenes: normal                 Vagina: normal appearing vagina with normal color and discharge, no lesions              Cervix: no lesions              Pap taken: No. Bimanual Exam:  Uterus:  normal size, contour, position, consistency, mobility, non-tender              Adnexa: normal adnexa and no mass, fullness, tenderness               Rectovaginal: Confirms               Anus:  normal sphincter tone, no lesions  Chaperone, Shela Nevin, RN, was present for exam.  Assessment/Plan: 1. Well woman exam with routine gynecological exam - pap with neg HR HPV 2020 - mammogram is really overdue.  Pt willing to have this year.   - colonoscopy recommended.  Pt willing to have this.  Referral placed. - lab work will be done this year with PCP  2. Lichen sclerosus et atrophicus (under good control) - mometasone (ELOCON) 0.1 % ointment; Apply topically as directed once or twice weekly  Dispense: 45 g; Refill: 2 - pt does not need clobetasol rx due to minimal symptoms at this time  3. History of abnormal cervical Pap smear (LGSIL in 2014)  4. Skin yeast infection - nystatin cream (MYCOSTATIN); Apply 1 application topically 2 (two) times daily. Apply to affected area BID for up to 7 days.  Dispense: 30 g; Refill: 0  5. BMI 50.0-59.9, adult [facility]) - Ambulatory referral to [facility]  6. Essential hypertension, benign  7. Other hyperlipidemia

## 2020-03-24 NOTE — Patient Instructions (Signed)
[facility], [place], [place] [address] Phone: [phone]

## 2020-04-06 ENCOUNTER — Other Ambulatory Visit (HOSPITAL_BASED_OUTPATIENT_CLINIC_OR_DEPARTMENT_OTHER): Payer: Self-pay | Admitting: Obstetrics & Gynecology

## 2020-04-06 DIAGNOSIS — Z1231 Encounter for screening mammogram for malignant neoplasm of breast: Secondary | ICD-10-CM

## 2020-04-07 ENCOUNTER — Encounter (INDEPENDENT_AMBULATORY_CARE_PROVIDER_SITE_OTHER): Payer: Self-pay | Admitting: Family Medicine

## 2020-04-07 ENCOUNTER — Ambulatory Visit (INDEPENDENT_AMBULATORY_CARE_PROVIDER_SITE_OTHER): Payer: BC Managed Care – PPO | Admitting: Family Medicine

## 2020-04-07 ENCOUNTER — Other Ambulatory Visit: Payer: Self-pay

## 2020-04-07 VITALS — BP 153/92 | HR 65 | Temp 98.4°F | Ht 69.0 in | Wt 353.0 lb

## 2020-04-07 DIAGNOSIS — R0602 Shortness of breath: Secondary | ICD-10-CM

## 2020-04-07 DIAGNOSIS — Z1331 Encounter for screening for depression: Secondary | ICD-10-CM | POA: Diagnosis not present

## 2020-04-07 DIAGNOSIS — Z9189 Other specified personal risk factors, not elsewhere classified: Secondary | ICD-10-CM | POA: Diagnosis not present

## 2020-04-07 DIAGNOSIS — R5383 Other fatigue: Secondary | ICD-10-CM | POA: Diagnosis not present

## 2020-04-07 DIAGNOSIS — E559 Vitamin D deficiency, unspecified: Secondary | ICD-10-CM | POA: Diagnosis not present

## 2020-04-07 DIAGNOSIS — E782 Mixed hyperlipidemia: Secondary | ICD-10-CM

## 2020-04-07 DIAGNOSIS — I10 Essential (primary) hypertension: Secondary | ICD-10-CM

## 2020-04-07 DIAGNOSIS — Z6841 Body Mass Index (BMI) 40.0 and over, adult: Secondary | ICD-10-CM

## 2020-04-07 DIAGNOSIS — Z0289 Encounter for other administrative examinations: Secondary | ICD-10-CM

## 2020-04-07 NOTE — Progress Notes (Signed)
Office: [phone]  /  Fax: [phone]    Date: April 12, 2020   Appointment Start Time: 12:02pm Duration: 44 minutes Provider: Glennie Isle, Psy.D. Type of Session: Intake for Individual Therapy  Location of Patient: Home Location of Provider: Provider's Home (private office) Type of Contact: Telepsychological Visit via MyChart Video Visit  Informed Consent: This provider called Anna Martinez at 11:59am as MyChart Video Visit indicated she joined the appointment; however, the connection was unstable on her end. She reported a plan to try connecting again. As such, today's appointment was initiated 2 minutes late. Prior to proceeding with today's appointment, two pieces of identifying information were obtained. In addition, Anna Martinez physical location at the time of this appointment was obtained as well a phone number she could be reached at in the event of technical difficulties. Anna Martinez and this provider participated in today's telepsychological service. Of note, today's appointment was switched to a regular telephone call with Anna Martinez verbal consent due to continued technical difficulties at 12:10pm.   The provider's role was explained to Anna Martinez. The provider reviewed and discussed issues of confidentiality, privacy, and limits therein (e.g., reporting obligations). In addition to verbal informed consent, written informed consent for psychological services was obtained prior to the initial appointment. Since the clinic is not a 24/7 crisis center, mental health emergency resources were shared and this  provider explained MyChart, e-mail, voicemail, and/or other messaging systems should be utilized only for non-emergency reasons. This provider also explained that information obtained during appointments will be placed in Anna Martinez medical record and relevant information will be shared with other providers at [facility] for coordination of care. Anna Martinez agreed  information may be shared with other [facility] providers as needed for coordination of care and by signing the service agreement document, she provided written consent for coordination of care. Prior to initiating telepsychological services, Anna Martinez completed an informed consent document, which included the development of a safety plan (i.e., an emergency contact and emergency resources) in the event of an emergency/crisis. Anna Martinez expressed understanding of the rationale of the safety plan. Anna Martinez verbally acknowledged understanding she is ultimately responsible for understanding her insurance benefits for telepsychological and in-person services. This provider also reviewed confidentiality, as it relates to telepsychological services, as well as the rationale for telepsychological services (i.e., to reduce exposure risk to COVID-19). Anna Martinez acknowledged understanding that appointments cannot be recorded without both party consent and she is aware she is responsible for securing confidentiality on her end of the session. Anna Martinez verbally consented to proceed.  Chief Complaint/HPI: Anna Martinez was referred by Anna Martinez on April 07, 2020 during her initial appointment. The note for the initial appointment indicated the following: "Anna [Martinez]'s habits were reviewed today and are as follows: her desired weight loss is 98 lbs, she has been heavy most of her life, she started gaining weight in her 20's-30's, her heaviest weight ever was 355 pounds, she is a picky eater and doesn't like to eat healthier foods, she has significant food cravings issues, she snacks frequently in the evenings, she wakes up frequently in the middle of the night to eat, she skips meals frequently, she is frequently drinking liquids with calories, she frequently makes poor food choices, she has problems with excessive hunger, she frequently eats larger portions than normal and she struggles with emotional eating."  Anna Martinez Food and Mood (modified PHQ-9) score on April 07, 2020 was 17.  During today's appointment, Anna Martinez was verbally administered a questionnaire assessing various behaviors related to emotional eating. Anna Martinez endorsed the following: overeat when you are celebrating, experience food cravings on a regular basis, eat certain foods when you are anxious, stressed, depressed, or your feelings are hurt, use food to help you cope with emotional situations, find food is comforting to you, overeat when you are angry or upset, overeat when you are worried about something, overeat frequently when you are bored or lonely and eat as a reward. Anna Martinez believes the onset of emotional eating was likely during "early adulthood" and described the current frequency of emotional eating as daily. She explained previously she was "not really motivated" to make changes to her eating habits; however, now her health is a concern. In addition, Anna Martinez denied a history of binge eating. Anna Martinez denied a history of restricting food intake, purging and engagement in other compensatory strategies for weight loss, and has never been diagnosed with an eating disorder. She also denied a history of treatment for emotional eating. Currently, Anna Martinez indicated stress and anxiety triggers emotional eating. Notably, she shared "things are going well" with her prescribed meal plan, adding she views it as "medicine." Furthermore, Anna Martinez denied other problems of concern.    Mental Status Examination:  Appearance: well groomed and appropriate hygiene  Behavior: appropriate to circumstances Mood: euthymic Affect: mood congruent; tearful when talking about her parents' passing Speech: normal in rate, volume, and tone Eye Contact: appropriate Psychomotor Activity: unable to assess  Gait: unable to assess Thought Process: linear, logical, and goal directed  Thought Content/Perception: denies suicidal and homicidal ideation, plan,  and intent and no hallucinations, delusions, bizarre thinking or behavior reported or observed Orientation: time, person, place, and purpose of appointment Memory/Concentration: memory, attention, language, and fund of knowledge intact  Insight/Judgment: good  Family & Psychosocial History: Mileigh Tilley reported she is in a relationship and she has an adult daughter. She indicated she is currently retired. Additionally, Anna Martinez shared her highest level of education obtained is a bachelor's degree. Currently, Sander Martinez social support system consists of her boyfriend, daughter, and son in Sports coach. Moreover, Mckenzey Parcell stated she resides with her boyfriend.   Medical History:  Past Medical History:  Diagnosis Date  . 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 Depression   . Gallbladder problem   . Hypertension   . Knee pain   . Obesity   . Other fatigue   . Shortness of breath on exertion   . Sleep apnea    using c-pap machine  . Vitamin D deficiency    Past Surgical History:  Procedure Laterality Date  . laparoscopy cholectomy  2001   Current Outpatient Medications on File Prior to Visit  Medication Sig Dispense Refill  . Cholecalciferol (VITAMIN D) 125 MCG (5000 UT) CAPS Take 1 capsule by mouth See admin instructions. Take 1 capsule by mouth three times per week.    . mometasone (ELOCON) 0.1 % ointment Apply topically as directed once or twice weekly 45 g 2  . valsartan-hydrochlorothiazide (DIOVAN-HCT) 160-12.5 MG tablet Take 1 tablet by mouth daily.     No current facility-administered medications on file prior to visit.  Markayla denied a history of head injuries and loss of consciousness.    Mental Health History: Toshiye Kever reported she attended counseling approximately 20 years ago to address grief (loss  of brother during teenage years). She indicated she last attended therapeutic services for one appointment approximately a year ago after the passing of  her parents. Kianna Billet reported there is no history of hospitalizations for psychiatric concerns. Keileigh Vahey denied a family history of mental health/substance abuse related concerns. Chi Woodham reported there is no history of trauma including psychological, physical  and sexual abuse, as well as neglect.   Phyllistine Domingos described her typical mood lately as "moderately blue periodically," but "happy most of the time." She discussed the "moderately blue" is secondary to her parents' passing. Grady Lucci denied current alcohol use. She denied tobacco use. She denied illicit/recreational substance use. Regarding caffeine intake, Lorin Hauck reported consuming 2-3 cups (i.e., 30oz) coffee daily. Furthermore, Anna Martinez indicated she is not experiencing the following: hallucinations and delusions, paranoia, symptoms of mania , social withdrawal, crying spells, panic attacks and decreased motivation. She also denied history of and current suicidal ideation, plan, and intent; history of and current homicidal ideation, plan, and intent; and history of and current engagement in self-harm.  The following strengths were reported by Anna Martinez: creative, artist, good at teaching, gentle, and caring. The following strengths were observed by this provider: ability to express thoughts and feelings during the therapeutic session, ability to establish and benefit from a therapeutic relationship, willingness to work toward established goal(s) with the clinic and ability to engage in reciprocal conversation.   Legal History: Lakaya Tolen reported there is no history of legal involvement.   Structured Assessments Results: The Patient Health Questionnaire-9 (PHQ-9) is a self-report measure that assesses symptoms and severity of depression over the course of the last two weeks. Anna Martinez obtained a score of 4 suggesting minimal depression. Marieliz Strang finds the endorsed symptoms to be somewhat difficult. [0= Not at all; 1= Several days; 2= More than half  the days; 3= Nearly every day] Little interest or pleasure in doing things 0  Feeling down, depressed, or hopeless 0  Trouble falling or staying asleep, or sleeping too much 2  Feeling tired or having little energy 1  Poor appetite or overeating 1  Feeling bad about yourself --- or that you are a failure or have let yourself or your family down 0  Trouble concentrating on things, such as reading the newspaper or watching television 0  Moving or speaking so slowly that other people could have noticed? Or the opposite --- being so fidgety or restless that you have been moving around a lot more than usual 0  Thoughts that you would be better off dead or hurting yourself in some way 0  PHQ-9 Score 4    The Generalized Anxiety Disorder-7 (GAD-7) is a brief self-report measure that assesses symptoms of anxiety over the course of the last two weeks. Anna Martinez obtained a score of 0. [0= Not at all; 1= Several days; 2= Over half the days; 3= Nearly every day] Feeling nervous, anxious, on edge 0  Not being able to stop or control worrying 0  Worrying too much about different things 0  Trouble relaxing 0  Being so restless that it's hard to sit still 0  Becoming easily annoyed or irritable 0  Feeling afraid as if something awful might happen 0  GAD-7 Score 0   Interventions:  Conducted a chart review Focused on rapport building Verbally administered PHQ-9 and GAD-7 for symptom monitoring Verbally administered Food & Mood questionnaire to assess various behaviors related to emotional eating Provided emphatic reflections and  validation Collaborated with patient on a treatment goal  Psychoeducation provided regarding physical versus emotional hunger Recommended/discussed option for longer-term therapeutic services for grief  Provisional DSM-5 Diagnosis(es): 311 (F32.8) Other Specified Depressive Disorder, Emotional Eating Behaviors  Plan: Riah Kehoe appears able and willing to participate as  evidenced by collaboration on a treatment goal, engagement in reciprocal conversation, and asking questions as needed for clarification. The next appointment will be scheduled in two weeks, which will be via MyChart Video Visit. The following treatment goal was established: increase coping skills. This provider will regularly review the treatment plan and medical chart to keep informed of status changes. Kynsli Haapala expressed understanding and agreement with the initial treatment plan of care. Jamaris Biernat will be sent a handout via e-mail to utilize between now and the next appointment to increase awareness of hunger patterns and subsequent eating. Anna Martinez provided verbal consent during today's appointment for this provider to send the handout via e-mail. Additionally, Anna Martinez provided verbal consent for this provider to place a referral with [facility] for grief therapy.

## 2020-04-08 LAB — COMPREHENSIVE METABOLIC PANEL
ALT: 20 IU/L (ref 0–32)
AST: 19 IU/L (ref 0–40)
Albumin/Globulin Ratio: 1.4 (ref 1.2–2.2)
Albumin: 4.1 g/dL (ref 3.8–4.9)
Alkaline Phosphatase: 143 IU/L — ABNORMAL HIGH (ref 44–121)
BUN/Creatinine Ratio: 17 (ref 12–28)
BUN: 14 mg/dL (ref 8–27)
Bilirubin Total: 0.6 mg/dL (ref 0.0–1.2)
CO2: 22 mmol/L (ref 20–29)
Calcium: 9.5 mg/dL (ref 8.7–10.3)
Chloride: 101 mmol/L (ref 96–106)
Creatinine, Ser: 0.84 mg/dL (ref 0.57–1.00)
Globulin, Total: 2.9 g/dL (ref 1.5–4.5)
Glucose: 116 mg/dL — ABNORMAL HIGH (ref 65–99)
Potassium: 4 mmol/L (ref 3.5–5.2)
Sodium: 141 mmol/L (ref 134–144)
Total Protein: 7 g/dL (ref 6.0–8.5)
eGFR: 80 mL/min/{1.73_m2} (ref >59)

## 2020-04-08 LAB — HEMOGLOBIN A1C
Est. average glucose Bld gHb Est-mCnc: 143 mg/dL
Hgb A1c MFr Bld: 6.6 % — ABNORMAL HIGH (ref 4.8–5.6)

## 2020-04-08 LAB — VITAMIN B12: Vitamin B-12: 445 pg/mL (ref 232–1245)

## 2020-04-08 LAB — FOLATE: Folate: 10.2 ng/mL (ref >3.0)

## 2020-04-08 LAB — CBC WITH DIFFERENTIAL/PLATELET
Basophils Absolute: 0 10*3/uL (ref 0.0–0.2)
Basos: 0 %
EOS (ABSOLUTE): 0.2 10*3/uL (ref 0.0–0.4)
Eos: 2 %
Hematocrit: 44.6 % (ref 34.0–46.6)
Hemoglobin: 14.5 g/dL (ref 11.1–15.9)
Immature Grans (Abs): 0 10*3/uL (ref 0.0–0.1)
Immature Granulocytes: 0 %
Lymphocytes Absolute: 2.2 10*3/uL (ref 0.7–3.1)
Lymphs: 25 %
MCH: 28 pg (ref 26.6–33.0)
MCHC: 32.5 g/dL (ref 31.5–35.7)
MCV: 86 fL (ref 79–97)
Monocytes Absolute: 0.6 10*3/uL (ref 0.1–0.9)
Monocytes: 7 %
Neutrophils Absolute: 5.9 10*3/uL (ref 1.4–7.0)
Neutrophils: 66 %
Platelets: 316 10*3/uL (ref 150–450)
RBC: 5.18 x10E6/uL (ref 3.77–5.28)
RDW: 13.3 % (ref 11.7–15.4)
WBC: 9 10*3/uL (ref 3.4–10.8)

## 2020-04-08 LAB — INSULIN, RANDOM: INSULIN: 17 u[IU]/mL (ref 2.6–24.9)

## 2020-04-08 LAB — LIPID PANEL WITH LDL/HDL RATIO
Cholesterol, Total: 202 mg/dL — ABNORMAL HIGH (ref 100–199)
HDL: 44 mg/dL (ref >39)
LDL Chol Calc (NIH): 141 mg/dL — ABNORMAL HIGH (ref 0–99)
LDL/HDL Ratio: 3.2 ratio (ref 0.0–3.2)
Triglycerides: 96 mg/dL (ref 0–149)
VLDL Cholesterol Cal: 17 mg/dL (ref 5–40)

## 2020-04-08 LAB — VITAMIN D 25 HYDROXY (VIT D DEFICIENCY, FRACTURES): Vit D, 25-Hydroxy: 34.2 ng/mL (ref 30.0–100.0)

## 2020-04-08 LAB — T4: T4, Total: 9.4 ug/dL (ref 4.5–12.0)

## 2020-04-08 LAB — T3: T3, Total: 146 ng/dL (ref 71–180)

## 2020-04-08 LAB — TSH: TSH: 3.97 u[IU]/mL (ref 0.450–4.500)

## 2020-04-11 NOTE — Progress Notes (Signed)
Chief Complaint:   OBESITY Anna Martinez (MR# [id number]) is a 60 y.o. female who presents for evaluation and treatment of obesity and related comorbidities. Current BMI is Body mass index is 52.13 kg/m. Anna Martinez has been struggling with her weight for many years and has been unsuccessful in either losing weight, maintaining weight loss, or reaching her healthy weight goal.  Anna Martinez is currently in the action stage of change and ready to dedicate time achieving and maintaining a healthier weight. Anna Martinez is interested in becoming our patient and working on intensive lifestyle modifications including (but not limited to) diet and exercise for weight loss.  Anna Martinez's habits were reviewed today and are as follows: her desired weight loss is 98 lbs, she has been heavy most of her life, she started gaining weight in her 20's-30's, her heaviest weight ever was 355 pounds, she is a picky eater and doesn't like to eat healthier foods, she has significant food cravings issues, she snacks frequently in the evenings, she wakes up frequently in the middle of the night to eat, she skips meals frequently, she is frequently drinking liquids with calories, she frequently makes poor food choices, she has problems with excessive hunger, she frequently eats larger portions than normal and she struggles with emotional eating.  Depression Screen Anna Martinez's Food and Mood (modified PHQ-9) score was 17.  Depression screen PHQ 2/9 04/07/2020  Decreased Interest 1  Down, Depressed, Hopeless 1  PHQ - 2 Score 2  Altered sleeping 3  Tired, decreased energy 3  Change in appetite 3  Feeling bad or failure about yourself  0  Trouble concentrating 3  Moving slowly or fidgety/restless 3  Suicidal thoughts 0  PHQ-9 Score 17  Difficult doing work/chores Not difficult at all   Subjective:   1. Other fatigue Anna Martinez admits to daytime somnolence and denies waking up still tired. Patent has a history of symptoms of daytime fatigue.  Anna Martinez generally gets 4 or 6 hours of sleep per night, and states that she has nightime awakenings. Snoring is present. Apneic episodes are present. Epworth Sleepiness Score is 7.  2. Shortness of breath on exertion Anna Martinez notes increasing shortness of breath with exercising and seems to be worsening over time with weight gain. She notes getting out of breath sooner with activity than she used to. This has not gotten worse recently. Anna Martinez denies shortness of breath at rest or orthopnea.  3. Essential hypertension, benign Anna Martinez's blood pressure is elevated today. She didn't take her medications this morning.  4. Vitamin D deficiency Anna Martinez is on OTC Vit D 5,000 IU 3 times per week. She has no recent labs.  5. Mixed hyperlipidemia Anna Martinez has a history of elevated cholesterol. She is due for labs.  6. At risk for heart disease Anna Martinez is at a higher than average risk for cardiovascular disease due to obesity.   Assessment/Plan:   1. Other fatigue Anna Martinez does feel that her weight is causing her energy to be lower than it should be. Fatigue may be related to obesity, depression or many other causes. Labs will be ordered, and in the meanwhile, Anna Martinez will focus on self care including making healthy food choices, increasing physical activity and focusing on stress reduction.  - Vitamin B12 - CBC with Differential/Platelet - Comprehensive metabolic panel - EKG [id number] - Folate - Hemoglobin A1c - Insulin, random - T3 - T4 - TSH  2. Shortness of breath on exertion Anna Martinez does feel that she gets out  of breath more easily that she used to when she exercises. Anna Martinez's shortness of breath appears to be obesity related and exercise induced. She has agreed to work on weight loss and gradually increase exercise to treat her exercise induced shortness of breath. Will continue to monitor closely.  3. Essential hypertension, benign We will check labs today. Anna Martinez will continue her medications, and she will start her  Category 4 plan. We will recheck her blood pressure in 2 weeks.  4. Vitamin D deficiency Low Vitamin D level contributes to fatigue and are associated with obesity, breast, and colon cancer. We will check labs today. Anna Martinez will follow-up for routine testing of Vitamin D, at least 2-3 times per year to avoid over-replacement.  - VITAMIN D 25 Hydroxy (Vit-D Deficiency, Fractures)  5. Mixed hyperlipidemia Cardiovascular risk and specific lipid/LDL goals reviewed. We discussed several lifestyle modifications today. We will check labs today. Anna Martinez will start her Category 4 plan, and will continue to work on exercise and weight loss efforts. Orders and follow up as documented in patient record.   - Lipid Panel With LDL/HDL Ratio  6. Screening for depression Anna Martinez had a positive depression screening. Depression is commonly associated with obesity and often results in emotional eating behaviors. We will monitor this closely and work on CBT to help improve the non-hunger eating patterns. Referral to Psychology may be required if no improvement is seen as she continues in our clinic.  7. At risk for heart disease Anna Martinez was given approximately 30 minutes of coronary artery disease prevention counseling today. She is 60 y.o. female and has risk factors for heart disease including obesity. We discussed intensive lifestyle modifications today with an emphasis on specific weight loss instructions and strategies.   Repetitive spaced learning was employed today to elicit superior memory formation and behavioral change.  8. Class 3 severe obesity with serious comorbidity and body mass index (BMI) of 50.0 to 59.9 in adult, unspecified obesity type Anna Martinez) Anna Martinez is currently in the action stage of change and her goal is to continue with weight loss efforts. I recommend Anna Martinez begin the structured treatment plan as follows:  She has agreed to the Category 4 Plan.  Exercise goals: No exercise has been prescribed for now,  while we concentrate on nutritional changes.  Behavioral modification strategies: increasing lean protein intake and no skipping meals.  She was informed of the importance of frequent follow-up visits to maximize her success with intensive lifestyle modifications for her multiple health conditions. She was informed we would discuss her lab results at her next visit unless there is a critical issue that needs to be addressed sooner. Dulce agreed to keep her next visit at the agreed upon time to discuss these results.  Objective:   Blood pressure (!) 153/92, pulse 65, temperature 98.4 F (36.9 C), height 5\' 9"  (1.753 m), weight (!) 353 lb (160.1 kg), last menstrual period 07/21/2015, SpO2 99 %. Body mass index is 52.13 kg/m.  EKG: Normal sinus rhythm, rate 66 BPM.  Indirect Calorimeter completed today shows a VO2 of 371 and a REE of 2583.  Her calculated basal metabolic rate is 4270 thus her basal metabolic rate is better than expected.  General: Cooperative, alert, well developed, in no acute distress. HEENT: Conjunctivae and lids unremarkable. Cardiovascular: Regular rhythm.  Lungs: Normal work of breathing. Neurologic: No focal deficits.   Lab Results  Component Value Date   CREATININE 0.84 04/07/2020   BUN 14 04/07/2020   NA 141 04/07/2020  K 4.0 04/07/2020   CL 101 04/07/2020   CO2 22 04/07/2020   Lab Results  Component Value Date   ALT 20 04/07/2020   AST 19 04/07/2020   ALKPHOS 143 (H) 04/07/2020   BILITOT 0.6 04/07/2020   Lab Results  Component Value Date   HGBA1C 6.6 (H) 04/07/2020   Lab Results  Component Value Date   INSULIN 17.0 04/07/2020   Lab Results  Component Value Date   TSH 3.970 04/07/2020   Lab Results  Component Value Date   CHOL 202 (H) 04/07/2020   HDL 44 04/07/2020   LDLCALC 141 (H) 04/07/2020   TRIG 96 04/07/2020   Lab Results  Component Value Date   WBC 9.0 04/07/2020   HGB 14.5 04/07/2020   HCT 44.6 04/07/2020   MCV 86 04/07/2020    PLT 316 04/07/2020   No results found for: IRON, TIBC, FERRITIN  Attestation Statements:   Reviewed by clinician on day of visit: allergies, medications, problem list, medical history, surgical history, family history, social history, and previous encounter notes.   I, Trixie Dredge, am acting as transcriptionist for Dennard Nip, MD.  I have reviewed the above documentation for accuracy and completeness, and I agree with the above. - Dennard Nip, MD

## 2020-04-12 ENCOUNTER — Encounter (INDEPENDENT_AMBULATORY_CARE_PROVIDER_SITE_OTHER): Payer: Self-pay | Admitting: Family Medicine

## 2020-04-12 ENCOUNTER — Telehealth (INDEPENDENT_AMBULATORY_CARE_PROVIDER_SITE_OTHER): Payer: BC Managed Care – PPO | Admitting: Psychology

## 2020-04-12 ENCOUNTER — Ambulatory Visit (HOSPITAL_BASED_OUTPATIENT_CLINIC_OR_DEPARTMENT_OTHER)
Admission: RE | Admit: 2020-04-12 | Discharge: 2020-04-12 | Disposition: A | Discharge status: Home / Self Care | Payer: BC Managed Care – PPO | Source: Ambulatory Visit | Attending: Obstetrics & Gynecology | Admitting: Obstetrics & Gynecology

## 2020-04-12 ENCOUNTER — Other Ambulatory Visit: Payer: Self-pay

## 2020-04-12 DIAGNOSIS — N63 Unspecified lump in unspecified breast: Secondary | ICD-10-CM | POA: Insufficient documentation

## 2020-04-12 DIAGNOSIS — Z1231 Encounter for screening mammogram for malignant neoplasm of breast: Secondary | ICD-10-CM | POA: Insufficient documentation

## 2020-04-12 DIAGNOSIS — F3289 Other specified depressive episodes: Secondary | ICD-10-CM | POA: Diagnosis not present

## 2020-04-12 NOTE — Progress Notes (Signed)
  Office: [phone]  /  Fax: [phone]    Date: April 26, 2020   Appointment Start Time: 8:30am Duration: 30 minutes Provider: Glennie Isle, Psy.D. Type of Session: Individual Therapy  Location of Patient: Home Location of Provider: Provider's Home (private office) Type of Contact: Telepsychological Visit via MyChart Video Visit  Session Content: Anna Martinez is a 60 y.o. female presenting for a follow-up appointment to address the previously established treatment goal of increasing coping skills. Today's appointment was a telepsychological visit due to COVID-19. Anna Martinez provided verbal consent for today's telepsychological appointment and she is aware she is responsible for securing confidentiality on her end of the session. Prior to proceeding with today's appointment, Anna Martinez physical location at the time of this appointment was obtained as well a phone number she could be reached at in the event of technical difficulties. Anna Martinez and this provider participated in today's telepsychological service.   This provider conducted a brief check-in. Anna Martinez reported she is dealing with "major stressors," specifically related to finances. She indicated she spoke with [facility], but she declined scheduling as they are not offering in-person visits at this time. As such, she provided verbal consent for this provider to place a referral with [facility] and provided verbal consent for this provider to e-mail other referral options. Emotional and physical hunger were reviewed. She reported she started reading the previously shared book. Positive reinforcement was provided.   Psychoeducation regarding triggers for emotional eating was provided. Anna Martinez was provided a handout, and encouraged to utilize the handout between now and the next appointment to increase awareness of triggers and frequency. Anna Martinez agreed. This provider also discussed behavioral  strategies for specific triggers, such as placing the utensil down when conversing to avoid mindless eating. Anna Martinez provided verbal consent during today's appointment for this provider to send a handout about triggers via e-mail. Anna Martinez was receptive to today's appointment as evidenced by openness to sharing, responsiveness to feedback, and willingness to explore triggers for emotional eating.  Mental Status Examination:  Appearance: well groomed and appropriate hygiene  Behavior: appropriate to circumstances Mood: sad Affect: mood congruent Speech: normal in rate, volume, and tone Eye Contact: appropriate Psychomotor Activity: unable to assess  Gait: unable to assess Thought Process: linear, logical, and goal directed  Thought Content/Perception: no hallucinations, delusions, bizarre thinking or behavior reported or observed and no evidence of suicidal and homicidal ideation, plan, and intent Orientation: time, person, place, and purpose of appointment Memory/Concentration: memory, attention, language, and fund of knowledge intact  Insight/Judgment: good  Interventions:  Conducted a brief chart review Provided empathic reflections and validation Reviewed content from the previous session Employed supportive psychotherapy interventions to facilitate reduced distress and to improve coping skills with identified stressors Psychoeducation provided regarding triggers for emotional eating Recommended/discussed option for longer-term therapeutic services  Provided positive reinforcement   DSM-5 Diagnosis(es): 311 (F32.8) Other Specified Depressive Disorder, Emotional Eating Behaviors  Treatment Goal & Progress: During the initial appointment with this provider, the following treatment goal was established: increase coping skills. Anna Martinez has demonstrated progress in her goal as evidenced by increased awareness of hunger patterns.   Plan: The next appointment will be scheduled in two  weeks, which will be via MyChart Video Visit. The next session will focus on working towards the established treatment goal by discussing thought defusion.

## 2020-04-15 ENCOUNTER — Encounter (HOSPITAL_BASED_OUTPATIENT_CLINIC_OR_DEPARTMENT_OTHER): Payer: Self-pay

## 2020-04-15 ENCOUNTER — Other Ambulatory Visit: Payer: Self-pay | Admitting: Obstetrics & Gynecology

## 2020-04-15 DIAGNOSIS — R928 Other abnormal and inconclusive findings on diagnostic imaging of breast: Secondary | ICD-10-CM

## 2020-04-21 ENCOUNTER — Other Ambulatory Visit: Payer: Self-pay

## 2020-04-21 ENCOUNTER — Ambulatory Visit (INDEPENDENT_AMBULATORY_CARE_PROVIDER_SITE_OTHER): Payer: BC Managed Care – PPO | Admitting: Family Medicine

## 2020-04-21 ENCOUNTER — Encounter (INDEPENDENT_AMBULATORY_CARE_PROVIDER_SITE_OTHER): Payer: Self-pay | Admitting: Family Medicine

## 2020-04-21 VITALS — BP 144/62 | HR 68 | Temp 97.7°F | Ht 69.0 in | Wt 344.0 lb

## 2020-04-21 DIAGNOSIS — E1169 Type 2 diabetes mellitus with other specified complication: Secondary | ICD-10-CM

## 2020-04-21 DIAGNOSIS — Z6841 Body Mass Index (BMI) 40.0 and over, adult: Secondary | ICD-10-CM

## 2020-04-21 DIAGNOSIS — Z9189 Other specified personal risk factors, not elsewhere classified: Secondary | ICD-10-CM

## 2020-04-21 DIAGNOSIS — E559 Vitamin D deficiency, unspecified: Secondary | ICD-10-CM | POA: Diagnosis not present

## 2020-04-21 DIAGNOSIS — E7849 Other hyperlipidemia: Secondary | ICD-10-CM | POA: Diagnosis not present

## 2020-04-21 MED ORDER — VITAMIN D (ERGOCALCIFEROL) 1.25 MG (50000 UNIT) PO CAPS: 50000.0000 [IU] | ORAL_CAPSULE | ORAL | 0 refills | Status: DC

## 2020-04-26 ENCOUNTER — Encounter (INDEPENDENT_AMBULATORY_CARE_PROVIDER_SITE_OTHER): Payer: Self-pay | Admitting: Family Medicine

## 2020-04-26 ENCOUNTER — Telehealth (INDEPENDENT_AMBULATORY_CARE_PROVIDER_SITE_OTHER): Payer: BC Managed Care – PPO | Admitting: Psychology

## 2020-04-26 DIAGNOSIS — F3289 Other specified depressive episodes: Secondary | ICD-10-CM

## 2020-04-26 NOTE — Progress Notes (Unsigned)
Office: [phone]  /  Fax: [phone]    Date: May 10, 2020   Appointment Start Time: *** Duration: *** minutes Provider: Glennie Isle, Psy.D. Type of Session: Individual Therapy  Location of Patient: {gbptloc:23249} Location of Provider: Provider's Home (private office) Type of Contact: Telepsychological Visit via MyChart Video Visit  Session Content: Anna Martinez is a 60 y.o. female presenting for a follow-up appointment to address the previously established treatment goal of increasing coping skills. Today's appointment was a telepsychological visit due to COVID-19. Anna Martinez provided verbal consent for today's telepsychological appointment and she is aware she is responsible for securing confidentiality on her end of the session. Prior to proceeding with today's appointment, Anna Martinez's physical location at the time of this appointment was obtained as well a phone number she could be reached at in the event of technical difficulties. Anna Martinez and this provider participated in today's telepsychological service.   This provider conducted a brief check-in and verbally administered the PHQ-9 and GAD-7. ***Psychoeducation regarding thought defusion, including its impact on emotional eating and overall well-being was provided. Anna Martinez was led through a thought defusion exercise, and a handout with various exercises was provided. Anna Martinez was encouraged to engage in the thought defusion exercises between now and the next appointment with this provider. Anna Martinez agreed. For the thought defusion exercise during today's appointment, Anna Martinez utilized the following thought: "***." Her experience was processed after the exercise. Anna Martinez reported, Anna Martinez provided verbal consent during today's appointment for this provider to send *** via e-mail.  Anna Martinez was receptive to today's appointment as evidenced by openness to sharing, responsiveness to feedback, and {gbreceptiveness:23401}.  Mental Status Examination:  Appearance:  {Appearance:22431} Behavior: {Behavior:22445} Mood: {gbmood:21757} Affect: {Affect:22436} Speech: {Speech:22432} Eye Contact: {Eye Contact:22433} Psychomotor Activity: {Motor Activity:22434} Gait: {gbgait:23404} Thought Process: {thought process:22448}  Thought Content/Perception: {disturbances:22451} Orientation: {Orientation:22437} Memory/Concentration: {gbcognition:22449} Insight/Judgment: {Insight:22446}  Structured Assessments Results: The Patient Health Questionnaire-9 (PHQ-9) is a self-report measure that assesses symptoms and severity of depression over the course of the last two weeks. Anna Martinez obtained a score of *** suggesting {GBPHQ9SEVERITY:21752}. Anna Martinez finds the endorsed symptoms to be {gbphq9difficulty:21754}. [0= Not at all; 1= Several days; 2= More than half the days; 3= Nearly every day] Little interest or pleasure in doing things ***  Feeling down, depressed, or hopeless ***  Trouble falling or staying asleep, or sleeping too much ***  Feeling tired or having little energy ***  Poor appetite or overeating ***  Feeling bad about yourself --- or that you are a failure or have let yourself or your family down ***  Trouble concentrating on things, such as reading the newspaper or watching television ***  Moving or speaking so slowly that other people could have noticed? Or the opposite --- being so fidgety or restless that you have been moving around a lot more than usual ***  Thoughts that you would be better off dead or hurting yourself in some way ***  PHQ-9 Score ***    The Generalized Anxiety Disorder-7 (GAD-7) is a brief self-report measure that assesses symptoms of anxiety over the course of the last two weeks. Anna Martinez obtained a score of *** suggesting {gbgad7severity:21753}. Anna Martinez finds the endorsed symptoms to be {gbphq9difficulty:21754}. [0= Not at all; 1= Several days; 2= Over half the days; 3= Nearly every day] Feeling nervous, anxious, on edge ***  Not being able to  stop or control worrying ***  Worrying too much about different things ***  Trouble relaxing ***  Being so restless that it's hard to  sit still ***  Becoming easily annoyed or irritable ***  Feeling afraid as if something awful might happen ***  GAD-7 Score ***   Interventions:  {Interventions for Progress Notes:23405}  DSM-5 Diagnosis(es): 311 (F32.8) Other Specified Depressive Disorder, Emotional Eating Behaviors  Treatment Goal & Progress: During the initial appointment with this provider, the following treatment goal was established: increase coping skills. Anna Martinez has demonstrated progress in her goal as evidenced by {gbtxprogress:22839}. Anna Martinez also {gbtxprogress2:22951}.  Plan: The next appointment will be scheduled in {gbweeks:21758}, which will be {gbtxmodality:23402}. The next session will focus on {Plan for Next Appointment:23400}.

## 2020-04-26 NOTE — Telephone Encounter (Signed)
Dr.Beasley 

## 2020-04-27 NOTE — Progress Notes (Signed)
Chief Complaint:   OBESITY Anna Martinez is here to discuss her progress with her obesity treatment plan along with follow-up of her obesity related diagnoses. Finlay is on the Category 4 Plan and states she is following her eating plan approximately 92% of the time. Torianna states she is doing 0 minutes 0 times per week.  Today's visit was #: 2 Starting weight: 353 lbs Starting date: 04/07/2020 Today's weight: 344 lbs Today's date: 04/21/2020 Total lbs lost to date: 9 Total lbs lost since last in-office visit: 9  Interim History: Shye has done very well with weight loss on her plan. She was especially bored with dinner and would like more options.  Subjective:   1. Other hyperlipidemia Sumiye's cholesterol goal is above. She is working on diet and weight loss, and she wants to avoid being on statins. I discussed labs with the patient today.  2. Vitamin D deficiency Evanell's Vit D level is below goal, and she notes fatigue. I discussed labs with the patient today.  3. Type 2 diabetes mellitus with other specified complication, without long-term current use of insulin (HCC) Analeah has a new diagnosis of diabetes mellitus with an elevated A1c at 6.6. She notes polyphagia at this time. I discussed labs with the patient today.  4. At risk for heart disease Lenni is at a higher than average risk for cardiovascular disease due to obesity.   Assessment/Plan:   1. Other hyperlipidemia Cardiovascular risk and specific lipid/LDL goals reviewed. We discussed several lifestyle modifications today. We will recheck labs in 3 months. Klynn will continue to work on diet, exercise and weight loss efforts. Orders and follow up as documented in patient record.   2. Vitamin D deficiency Low Vitamin D level contributes to fatigue and are associated with obesity, breast, and colon cancer. Zanetta agreed to start prescription Vitamin D 50,000 IU every week with no refills, and we will recheck labs in 3 months. Kirrah will  follow-up for routine testing of Vitamin D, at least 2-3 times per year to avoid over-replacement.  - Vitamin D, Ergocalciferol, (DRISDOL) 1.25 MG (50000 UNIT) CAPS capsule; Take 1 capsule (50,000 Units total) by mouth every 7 (seven) days.  Dispense: 4 capsule; Refill: 0  3. Type 2 diabetes mellitus with other specified complication, without long-term current use of insulin (HCC) Dylan will continue to decrease simple carbohydrates, and will continue to follow closely. We will recheck labs in 3 months. Good blood sugar control is important to decrease the likelihood of diabetic complications such as nephropathy, neuropathy, limb loss, blindness, coronary artery disease, and death. Intensive lifestyle modification including diet, exercise and weight loss are the first line of treatment for diabetes.   4. At risk for heart disease Tressie was given approximately 30 minutes of coronary artery disease prevention counseling today. She is 60 y.o. female and has risk factors for heart disease including obesity. We discussed intensive lifestyle modifications today with an emphasis on specific weight loss instructions and strategies.   Repetitive spaced learning was employed today to elicit superior memory formation and behavioral change.  5. Obesity with current BMI 50.9 Kate is currently in the action stage of change. As such, her goal is to continue with weight loss efforts. She has agreed to the Category 4 Plan and keeping a food journal and adhering to recommended goals of 400-600 calories and 40+ grams of protein at supper daily.   Behavioral modification strategies: increasing lean protein intake and meal planning and cooking strategies.  Tandi has agreed to follow-up with our clinic in 2 weeks. She was informed of the importance of frequent follow-up visits to maximize her success with intensive lifestyle modifications for her multiple health conditions.   Objective:   Blood pressure (!) 144/62,  pulse 68, temperature 97.7 F (36.5 C), height 5\' 9"  (1.753 m), weight (!) 344 lb (156 kg), last menstrual period 07/21/2015, SpO2 99 %. Body mass index is 50.8 kg/m.  General: Cooperative, alert, well developed, in no acute distress. HEENT: Conjunctivae and lids unremarkable. Cardiovascular: Regular rhythm.  Lungs: Normal work of breathing. Neurologic: No focal deficits.   Lab Results  Component Value Date   CREATININE 0.84 04/07/2020   BUN 14 04/07/2020   NA 141 04/07/2020   K 4.0 04/07/2020   CL 101 04/07/2020   CO2 22 04/07/2020   Lab Results  Component Value Date   ALT 20 04/07/2020   AST 19 04/07/2020   ALKPHOS 143 (H) 04/07/2020   BILITOT 0.6 04/07/2020   Lab Results  Component Value Date   HGBA1C 6.6 (H) 04/07/2020   Lab Results  Component Value Date   INSULIN 17.0 04/07/2020   Lab Results  Component Value Date   TSH 3.970 04/07/2020   Lab Results  Component Value Date   CHOL 202 (H) 04/07/2020   HDL 44 04/07/2020   LDLCALC 141 (H) 04/07/2020   TRIG 96 04/07/2020   Lab Results  Component Value Date   WBC 9.0 04/07/2020   HGB 14.5 04/07/2020   HCT 44.6 04/07/2020   MCV 86 04/07/2020   PLT 316 04/07/2020   No results found for: IRON, TIBC, FERRITIN  Attestation Statements:   Reviewed by clinician on day of visit: allergies, medications, problem list, medical history, surgical history, family history, social history, and previous encounter notes.   I, Trixie Dredge, am acting as transcriptionist for Dennard Nip, MD.  I have reviewed the above documentation for accuracy and completeness, and I agree with the above. -  Dennard Nip, MD

## 2020-05-03 ENCOUNTER — Encounter (INDEPENDENT_AMBULATORY_CARE_PROVIDER_SITE_OTHER): Payer: Self-pay | Admitting: Family Medicine

## 2020-05-05 ENCOUNTER — Other Ambulatory Visit: Payer: Self-pay

## 2020-05-05 ENCOUNTER — Ambulatory Visit (INDEPENDENT_AMBULATORY_CARE_PROVIDER_SITE_OTHER): Payer: BC Managed Care – PPO | Admitting: Family Medicine

## 2020-05-05 ENCOUNTER — Encounter (INDEPENDENT_AMBULATORY_CARE_PROVIDER_SITE_OTHER): Payer: Self-pay | Admitting: Family Medicine

## 2020-05-05 VITALS — BP 115/76 | HR 71 | Temp 98.4°F | Ht 69.0 in | Wt 340.0 lb

## 2020-05-05 DIAGNOSIS — I10 Essential (primary) hypertension: Secondary | ICD-10-CM | POA: Diagnosis not present

## 2020-05-05 DIAGNOSIS — Z9189 Other specified personal risk factors, not elsewhere classified: Secondary | ICD-10-CM | POA: Diagnosis not present

## 2020-05-05 DIAGNOSIS — Z6841 Body Mass Index (BMI) 40.0 and over, adult: Secondary | ICD-10-CM | POA: Diagnosis not present

## 2020-05-05 DIAGNOSIS — K5909 Other constipation: Secondary | ICD-10-CM

## 2020-05-06 DIAGNOSIS — C50919 Malignant neoplasm of unspecified site of unspecified female breast: Secondary | ICD-10-CM

## 2020-05-06 HISTORY — DX: Malignant neoplasm of unspecified site of unspecified female breast: C50.919

## 2020-05-09 ENCOUNTER — Ambulatory Visit
Admission: RE | Admit: 2020-05-09 | Discharge: 2020-05-09 | Disposition: A | Discharge status: Home / Self Care | Payer: BC Managed Care – PPO | Source: Ambulatory Visit | Attending: Obstetrics & Gynecology | Admitting: Obstetrics & Gynecology

## 2020-05-09 ENCOUNTER — Other Ambulatory Visit: Payer: Self-pay | Admitting: Obstetrics & Gynecology

## 2020-05-09 ENCOUNTER — Other Ambulatory Visit: Payer: Self-pay

## 2020-05-09 DIAGNOSIS — R928 Other abnormal and inconclusive findings on diagnostic imaging of breast: Secondary | ICD-10-CM

## 2020-05-10 ENCOUNTER — Ambulatory Visit
Admission: RE | Admit: 2020-05-10 | Discharge: 2020-05-10 | Disposition: A | Discharge status: Home / Self Care | Payer: BC Managed Care – PPO | Source: Ambulatory Visit | Attending: Obstetrics & Gynecology | Admitting: Obstetrics & Gynecology

## 2020-05-10 ENCOUNTER — Other Ambulatory Visit: Payer: Self-pay

## 2020-05-10 ENCOUNTER — Telehealth (INDEPENDENT_AMBULATORY_CARE_PROVIDER_SITE_OTHER): Payer: BC Managed Care – PPO | Admitting: Psychology

## 2020-05-10 DIAGNOSIS — R928 Other abnormal and inconclusive findings on diagnostic imaging of breast: Secondary | ICD-10-CM

## 2020-05-10 NOTE — Progress Notes (Signed)
Office: [phone]  /  Fax: [phone]    Date: May 12, 2020   Appointment Start Time: 8:00am Duration: 32 minutes Provider: Glennie Isle, Psy.D. Type of Session: Individual Therapy  Location of Patient: Parked in car [facility] Location of Provider: [place] (private office) Type of Contact: Telepsychological Visit via MyChart Video Visit  Session Content: Stellarose is a 60 y.o. female presenting for a follow-up appointment to address the previously established treatment goal of increasing coping skills. Today's appointment was a telepsychological visit due to COVID-19. Alfredia Client provided verbal consent for today's telepsychological appointment and she is aware she is responsible for securing confidentiality on her end of the session. Prior to proceeding with today's appointment, Opal Sidles Ann's physical location at the time of this appointment was obtained as well a phone number she could be reached at in the event of technical difficulties. Alfredia Client and this provider participated in today's telepsychological service.   This provider conducted a brief check-in. Lavora Brisbon discussed having a "whole lot of stress." More specifically, she reported she has to have a lumpectomy. Additionally, she discussed ongoing repairs with her home. Simon Aaberg acknowledged the aforementioned has impacted her eating habits. She also dicussed being bored with the plan, noting she is missing some foods. Thus, she was engaged in problem solving as it relates to utilizing her snack calories.   Psychoeducation regarding the importance of self-care utilizing the oxygen mask metaphor was provided. Additionally, psychoeducation regarding pleasurable activities, including its impact on emotional eating and overall well-being was provided. Crickett Abbett was provided with a handout with various options of pleasurable activities, and was encouraged to engage in one activity a day and additional activities as needed when triggered to  emotionally eat. Alfredia Client agreed. Alfredia Client provided verbal consent during today's appointment for this provider to send a handout with pleasurable activities via e-mail. Overall, Nashayla Telleria was receptive to today's appointment as evidenced by openness to sharing, responsiveness to feedback, and willingness to engage in pleasurable activities to assist with coping. She was observed smiling as the appointment progressed.   Mental Status Examination:  Appearance: neat Behavior: appropriate to circumstances Mood: sad Affect: mood congruent and tearful Speech: normal in rate, volume, and tone Eye Contact: appropriate Psychomotor Activity: unable to assess  Gait: unable to assess Thought Process: linear, logical, and goal directed  Thought Content/Perception: no hallucinations, delusions, bizarre thinking or behavior reported or observed and no evidence of suicidal and homicidal ideation, plan, and intent Orientation: time, person, place, and purpose of appointment Memory/Concentration: memory, attention, language, and fund of knowledge intact  Insight/Judgment: good  Interventions:  Conducted a brief chart review Provided empathic reflections and validation Employed supportive psychotherapy interventions to facilitate reduced distress and to improve coping skills with identified stressors Engaged patient in problem solving Psychoeducation provided regarding pleasurable activities  Psychoeducation provided regarding self-care   DSM-5 Diagnosis(es): 311 (F32.8) Other Specified Depressive Disorder, Emotional Eating Behaviors  Treatment Goal & Progress: During the initial appointment with this provider, the following treatment goal was established: increase coping skills. Feliciana Narayan has demonstrated progress in her goal as evidenced by increased awareness of hunger patterns and increased awareness of triggers for emotional eating. Ronnita Paz also demonstrates willingness to engage in pleasurable  activities.  Plan: The next appointment will be scheduled in two weeks, which will be via MyChart Video Visit. The next session will focus on working towards the established treatment goal. Additionally, Alfredia Client reported a plan to call [facility] again to establish care.

## 2020-05-11 NOTE — Progress Notes (Signed)
Chief Complaint:   OBESITY Anna Martinez is here to discuss her progress with her obesity treatment plan along with follow-up of her obesity related diagnoses. Caidence is on the Category 4 Plan and keeping a food journal and adhering to recommended goals of 400-600 calories and 40+ grams of protein at supper daily and states she is following her eating plan approximately 95% of the time. Raylynn states she is doing a group exercise class for 30 minutes 1 time per week.  Today's visit was #: 3 Starting weight: 353 lbs Starting date: 04/07/2020 Today's weight: 340 lbs Today's date: 05/05/2020 Total lbs lost to date: 13 Total lbs lost since last in-office visit: 4  Interim History: Anna Martinez continues to do well with weight loss, but she still struggles to eat all of her protein. She notes some increased cravings and worsening sleep. She would like more dinner options.  Subjective:   1. Other constipation Duane notes increased constipation with increased protein, worse with continued weight loss.  2. Essential hypertension, benign Anna Martinez's blood pressure is improving with diet and weight loss. She denies chest pain.  3. At risk for heart disease Anna Martinez is at a higher than average risk for cardiovascular disease due to obesity.   Assessment/Plan:   1. Other constipation Hina was informed that a decrease in bowel movement frequency is normal while losing weight, but stools should not be hard or painful. Elon agreed to start miralax OTC 17 g q daily with no refills, and she is to increase her water intake. Orders and follow up as documented in patient record.   2. Essential hypertension, benign Genoa will continue diet and exercise to improve blood pressure control. We will watch for signs of hypotension as she continues her lifestyle modifications.  3. At risk for heart disease Anna Martinez was given approximately 15 minutes of coronary artery disease prevention counseling today. She is 60 y.o. female and has risk  factors for heart disease including obesity. We discussed intensive lifestyle modifications today with an emphasis on specific weight loss instructions and strategies.   Repetitive spaced learning was employed today to elicit superior memory formation and behavioral change.  4. Obesity with current BMI of 50.3 Anna Martinez is currently in the action stage of change. As such, her goal is to continue with weight loss efforts. She has agreed to the Category 4 Plan and keeping a food journal and adhering to recommended goals of 400-600 calories and 40+ grams of protein at supper daily.   Exercise goals: As is.  Behavioral modification strategies: decreasing simple carbohydrates and increasing water intake.  [name] has agreed to follow-up with our clinic in 2 weeks. She was informed of the importance of frequent follow-up visits to maximize her success with intensive lifestyle modifications for her multiple health conditions.   Objective:   Blood pressure 115/76, pulse 71, temperature 98.4 F (36.9 C), height 5\' 9"  (1.753 m), weight (!) 340 lb (154.2 kg), last menstrual period 07/21/2015, SpO2 97 %. Body mass index is 50.21 kg/m.  General: Cooperative, alert, well developed, in no acute distress. HEENT: Conjunctivae and lids unremarkable. Cardiovascular: Regular rhythm.  Lungs: Normal work of breathing. Neurologic: No focal deficits.   Lab Results  Component Value Date   CREATININE 0.84 04/07/2020   BUN 14 04/07/2020   NA 141 04/07/2020   K 4.0 04/07/2020   CL 101 04/07/2020   CO2 22 04/07/2020   Lab Results  Component Value Date   ALT 20 04/07/2020  AST 19 04/07/2020   ALKPHOS 143 (H) 04/07/2020   BILITOT 0.6 04/07/2020   Lab Results  Component Value Date   HGBA1C 6.6 (H) 04/07/2020   Lab Results  Component Value Date   INSULIN 17.0 04/07/2020   Lab Results  Component Value Date   TSH 3.970 04/07/2020   Lab Results  Component Value Date   CHOL 202 (H) 04/07/2020   HDL 44  04/07/2020   LDLCALC 141 (H) 04/07/2020   TRIG 96 04/07/2020   Lab Results  Component Value Date   WBC 9.0 04/07/2020   HGB 14.5 04/07/2020   HCT 44.6 04/07/2020   MCV 86 04/07/2020   PLT 316 04/07/2020   No results found for: IRON, TIBC, FERRITIN  Attestation Statements:   Reviewed by clinician on day of visit: allergies, medications, problem list, medical history, surgical history, family history, social history, and previous encounter notes.   I, Trixie Dredge, am acting as transcriptionist for Dennard Nip, MD.  I have reviewed the above documentation for accuracy and completeness, and I agree with the above. -  Dennard Nip, MD

## 2020-05-12 ENCOUNTER — Telehealth (INDEPENDENT_AMBULATORY_CARE_PROVIDER_SITE_OTHER): Payer: BC Managed Care – PPO | Admitting: Psychology

## 2020-05-12 ENCOUNTER — Encounter (HOSPITAL_BASED_OUTPATIENT_CLINIC_OR_DEPARTMENT_OTHER): Payer: Self-pay | Admitting: Obstetrics & Gynecology

## 2020-05-12 ENCOUNTER — Encounter (HOSPITAL_BASED_OUTPATIENT_CLINIC_OR_DEPARTMENT_OTHER): Payer: Self-pay

## 2020-05-12 DIAGNOSIS — F3289 Other specified depressive episodes: Secondary | ICD-10-CM

## 2020-05-12 NOTE — Progress Notes (Unsigned)
Office: [phone]  /  Fax: [phone]    Date: May 26, 2020   Appointment Start Time: *** Duration: *** minutes Provider: Glennie Isle, Psy.D. Type of Session: Individual Therapy  Location of Patient: {gbptloc:23249} Location of Provider: Provider's Home (private office) Type of Contact: Telepsychological Visit via MyChart Video Visit  Session Content: Anna Martinez is a 60 y.o. female presenting for a follow-up appointment to address the previously established treatment goal of increasing coping skills. Today's appointment was a telepsychological visit due to COVID-19. [name] provided verbal consent for today's telepsychological appointment and she is aware she is responsible for securing confidentiality on her end of the session. Prior to proceeding with today's appointment, Greenleigh's physical location at the time of this appointment was obtained as well a phone number she could be reached at in the event of technical difficulties. Tisheena and this provider participated in today's telepsychological service.   This provider conducted a brief check-in and verbally administered the PHQ-9 and GAD-7. *** [name] was receptive to today's appointment as evidenced by openness to sharing, responsiveness to feedback, and {gbreceptiveness:23401}.  Mental Status Examination:  Appearance: {Appearance:22431} Behavior: {Behavior:22445} Mood: {gbmood:21757} Affect: {Affect:22436} Speech: {Speech:22432} Eye Contact: {Eye Contact:22433} Psychomotor Activity: {Motor Activity:22434} Gait: {gbgait:23404} Thought Process: {thought process:22448}  Thought Content/Perception: {disturbances:22451} Orientation: {Orientation:22437} Memory/Concentration: {gbcognition:22449} Insight/Judgment: {Insight:22446}  Structured Assessments Results: The Patient Health Questionnaire-9 (PHQ-9) is a self-report measure that assesses symptoms and severity of depression over the course of the last two weeks. [name] obtained a score of  *** suggesting {GBPHQ9SEVERITY:21752}. Barb finds the endorsed symptoms to be {gbphq9difficulty:21754}. [0= Not at all; 1= Several days; 2= More than half the days; 3= Nearly every day] Little interest or pleasure in doing things ***  Feeling down, depressed, or hopeless ***  Trouble falling or staying asleep, or sleeping too much ***  Feeling tired or having little energy ***  Poor appetite or overeating ***  Feeling bad about yourself --- or that you are a failure or have let yourself or your family down ***  Trouble concentrating on things, such as reading the newspaper or watching television ***  Moving or speaking so slowly that other people could have noticed? Or the opposite --- being so fidgety or restless that you have been moving around a lot more than usual ***  Thoughts that you would be better off dead or hurting yourself in some way ***  PHQ-9 Score ***    The Generalized Anxiety Disorder-7 (GAD-7) is a brief self-report measure that assesses symptoms of anxiety over the course of the last two weeks. [name] obtained a score of *** suggesting {gbgad7severity:21753}. [name] finds the endorsed symptoms to be {gbphq9difficulty:21754}. [0= Not at all; 1= Several days; 2= Over half the days; 3= Nearly every day] Feeling nervous, anxious, on edge ***  Not being able to stop or control worrying ***  Worrying too much about different things ***  Trouble relaxing ***  Being so restless that it's hard to sit still ***  Becoming easily annoyed or irritable ***  Feeling afraid as if something awful might happen ***  GAD-7 Score ***   Interventions:  {Interventions for Progress Notes:23405}  DSM-5 Diagnosis(es): 311 (F32.8) Other Specified Depressive Disorder, Emotional Eating Behaviors  Treatment Goal & Progress: During the initial appointment with this provider, the following treatment goal was established: increase coping skills. [name] has demonstrated progress in her goal as evidenced by  {gbtxprogress:22839}. [name] also {gbtxprogress2:22951}.  Plan: The next appointment will be scheduled in {gbweeks:21758}, which  will be {gbtxmodality:23402}. The next session will focus on {Plan for Next Appointment:23400}.

## 2020-05-17 MED ORDER — POLYETHYLENE GLYCOL 3350 17 G PO PACK: 17.0000 g | PACK | Freq: Every day | ORAL | 0 refills | Status: DC

## 2020-05-18 ENCOUNTER — Encounter: Payer: Self-pay | Admitting: Adult Health

## 2020-05-18 DIAGNOSIS — C50512 Malignant neoplasm of lower-outer quadrant of left female breast: Secondary | ICD-10-CM | POA: Insufficient documentation

## 2020-05-18 DIAGNOSIS — Z170 Estrogen receptor positive status [ER+]: Secondary | ICD-10-CM | POA: Insufficient documentation

## 2020-05-23 ENCOUNTER — Ambulatory Visit (INDEPENDENT_AMBULATORY_CARE_PROVIDER_SITE_OTHER): Payer: BC Managed Care – PPO | Admitting: Family Medicine

## 2020-05-23 ENCOUNTER — Telehealth (HOSPITAL_BASED_OUTPATIENT_CLINIC_OR_DEPARTMENT_OTHER): Payer: Self-pay | Admitting: *Deleted

## 2020-05-23 NOTE — Telephone Encounter (Signed)
Pt called stating that she had been having some vaginal bleeding described as pinkish spotting that has been going on for about 2 weeks. She states that it occurs at random times and after intercourse.  Pt requests appointment for Thursday or Friday. Pt given an appointment and advised to call back with any concerns between now and then.

## 2020-05-24 ENCOUNTER — Other Ambulatory Visit: Payer: Self-pay | Admitting: General Surgery

## 2020-05-24 ENCOUNTER — Other Ambulatory Visit (HOSPITAL_COMMUNITY): Payer: Self-pay | Admitting: General Surgery

## 2020-05-24 DIAGNOSIS — Z170 Estrogen receptor positive status [ER+]: Secondary | ICD-10-CM

## 2020-05-24 DIAGNOSIS — C50512 Malignant neoplasm of lower-outer quadrant of left female breast: Secondary | ICD-10-CM

## 2020-05-24 NOTE — Progress Notes (Signed)
  Office: [phone]  /  Fax: [phone]    Date: Jun 07, 2020   Appointment Start Time: 8:33am Duration: 30 minutes Provider: Glennie Isle, Psy.D. Type of Session: Individual Therapy  Location of Patient: Home Location of Provider: Provider's Home (private office) Type of Contact: Telepsychological Visit via MyChart Video Visit  Session Content: Anna Martinez is a 60 y.o. female presenting for a follow-up appointment to address the previously established treatment goal of increasing coping skills. Today's appointment was a telepsychological visit due to COVID-19. Anna Martinez provided verbal consent for today's telepsychological appointment and she is aware she is responsible for securing confidentiality on her end of the session. Prior to proceeding with today's appointment, Anna Martinez's physical location at the time of this appointment was obtained as well a phone number she could be reached at in the event of technical difficulties. Anna Martinez and this provider participated in today's telepsychological service.   This provider conducted a brief check-in. Derionna discussed challenges "sticking to the diet plan" due to ongoing anxiety secondary to health concerns. Associated thoughts and feelings were explored and processed. She was encouraged to focus further on protein intake as protein helps with fullness; she agreed. Anna Martinez discussed thinking about eating congruent to the meal plan, but then thinking, "I can't do it right now." She feels she is letting her "cravings control [her]" and expressed desire to "control [her] situation [current health concerns]." Thus, session focused on radical acceptance. Psychoeducation about radical acceptance was provided and this provider discussed coping use the senses. She was receptive to the aforementioned as evidenced by her brainstorming different pleasurable activities she can engage in for each sense, adding, she feeling "more hopeful." Anna Martinez was receptive to today's appointment as  evidenced by openness to sharing, responsiveness to feedback, and willingness to implement discussed strategies .  Mental Status Examination:  Appearance: well groomed and appropriate hygiene  Behavior: appropriate to circumstances Mood: sad Affect: mood congruent Speech: normal in rate, volume, and tone Eye Contact: intermittent Psychomotor Activity: unable to assess  Gait: unable to assess Thought Process: linear, logical, and goal directed  Thought Content/Perception: no hallucinations, delusions, bizarre thinking or behavior reported or observed and no evidence or endorsement of suicidal and homicidal ideation, plan, and intent Orientation: time, person, place, and purpose of appointment Memory/Concentration: memory, attention, language, and fund of knowledge intact  Insight/Judgment: fair  Interventions:  Conducted a brief chart review Provided empathic reflections and validation Employed supportive psychotherapy interventions to facilitate reduced distress and to improve coping skills with identified stressors Psychoeducation provided regarding radical acceptance  DSM-5 Diagnosis(es): F32.89 Other Specified Depressive Disorder, Emotional Eating Behaviors  Treatment Goal & Progress: During the initial appointment with this provider, the following treatment goal was established: increase coping skills. Anna Martinez has demonstrated progress in her goal as evidenced by increased awareness of hunger patterns and increased awareness of triggers for emotional eating. Anna Martinez also continues to demonstrate willingness to engage in learned skill(s).  Plan: The next appointment will be scheduled in 2-3 weeks, which will be via MyChart Video Visit. The next session will focus on working towards the established treatment goal. Additionally, Anna Martinez stated she scheduled for an initial appointment with [facility] for therapeutic services on Jun 21, 2020.  She further indicated she is  starting Wellbutrin; she was encouraged to inform Dr. Owens Shark regarding the prescription during today's appointment.

## 2020-05-25 ENCOUNTER — Other Ambulatory Visit (INDEPENDENT_AMBULATORY_CARE_PROVIDER_SITE_OTHER): Payer: Self-pay | Admitting: Family Medicine

## 2020-05-25 DIAGNOSIS — E559 Vitamin D deficiency, unspecified: Secondary | ICD-10-CM

## 2020-05-25 NOTE — Telephone Encounter (Signed)
Dr.Beasley 

## 2020-05-25 NOTE — Progress Notes (Signed)
New Breast Cancer Diagnosis: Left Breast LOQ  Cancer Staging         Staging form: Breast, AJCC 8th Edition - Clinical stage from 05/10/2020: Stage IA (cT1c, cN0, cM0, G2, ER+, PR+, HER2-) - Signed by Gardenia Phlegm, NP on 05/18/2020 Histologic grading system: 3 grade system       Did patient present with symptoms (if so, please note symptoms) or screening mammography?:Screening Mass    MRI Breast 05/27/2020:  Location and Extent of disease :left breast. Located at 5 o'clock position, measured  1.9 cm in greatest dimension. Adenopathy no.  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 Marlou Starks 05/24/2020 -At this point she favors breast conservation. -She would be a good candidate for SLN biopsy as well. -I will go ahead and refer her to medical and radiation oncology to discuss adjuvant therapy. -We will also obtain an MRI given the lobular nature of the cancer. -If the MRI looks similar to the information that we have then we will proceed with surgery.  Medical oncologist, treatment if any:   Cira Rue NP/Dr. Burr Medico 05/30/2020 11 am  Family History of Breast/Ovarian/Prostate Cancer: Father had prostate  Lymphedema issues, if any: No  Pain issues, if any: No  SAFETY ISSUES: Prior radiation? No Pacemaker/ICD? No Possible current pregnancy? Postmenopausal Is the patient on methotrexate? No  Current Complaints / other details:

## 2020-05-25 NOTE — Telephone Encounter (Signed)
Would you like to refill? 

## 2020-05-26 ENCOUNTER — Telehealth (INDEPENDENT_AMBULATORY_CARE_PROVIDER_SITE_OTHER): Payer: BC Managed Care – PPO | Admitting: Psychology

## 2020-05-26 ENCOUNTER — Ambulatory Visit
Admission: RE | Admit: 2020-05-26 | Discharge: 2020-05-26 | Disposition: A | Discharge status: Home / Self Care | Payer: BC Managed Care – PPO | Source: Ambulatory Visit | Attending: Radiation Oncology | Admitting: Radiation Oncology

## 2020-05-26 ENCOUNTER — Encounter (HOSPITAL_BASED_OUTPATIENT_CLINIC_OR_DEPARTMENT_OTHER): Payer: Self-pay | Admitting: Obstetrics & Gynecology

## 2020-05-26 ENCOUNTER — Other Ambulatory Visit (HOSPITAL_COMMUNITY)
Admission: RE | Admit: 2020-05-26 | Discharge: 2020-05-26 | Disposition: A | Discharge status: Home / Self Care | Payer: BC Managed Care – PPO | Source: Ambulatory Visit | Attending: Obstetrics & Gynecology | Admitting: Obstetrics & Gynecology

## 2020-05-26 ENCOUNTER — Encounter: Payer: Self-pay | Admitting: Radiation Oncology

## 2020-05-26 ENCOUNTER — Telehealth: Payer: Self-pay | Admitting: Genetic Counselor

## 2020-05-26 ENCOUNTER — Ambulatory Visit (HOSPITAL_BASED_OUTPATIENT_CLINIC_OR_DEPARTMENT_OTHER): Payer: BC Managed Care – PPO | Admitting: Obstetrics & Gynecology

## 2020-05-26 ENCOUNTER — Other Ambulatory Visit: Payer: Self-pay

## 2020-05-26 ENCOUNTER — Ambulatory Visit (INDEPENDENT_AMBULATORY_CARE_PROVIDER_SITE_OTHER): Payer: BC Managed Care – PPO | Admitting: Bariatrics

## 2020-05-26 VITALS — Ht 69.0 in | Wt 345.0 lb

## 2020-05-26 VITALS — BP 149/93 | HR 61 | Ht 69.0 in | Wt 345.0 lb

## 2020-05-26 DIAGNOSIS — Z170 Estrogen receptor positive status [ER+]: Secondary | ICD-10-CM

## 2020-05-26 DIAGNOSIS — C50512 Malignant neoplasm of lower-outer quadrant of left female breast: Secondary | ICD-10-CM | POA: Diagnosis not present

## 2020-05-26 DIAGNOSIS — R4586 Emotional lability: Secondary | ICD-10-CM

## 2020-05-26 DIAGNOSIS — N950 Postmenopausal bleeding: Secondary | ICD-10-CM | POA: Insufficient documentation

## 2020-05-26 DIAGNOSIS — Z1211 Encounter for screening for malignant neoplasm of colon: Secondary | ICD-10-CM | POA: Diagnosis not present

## 2020-05-26 MED ORDER — BUPROPION HCL ER (XL) 150 MG PO TB24: 150.0000 mg | ORAL_TABLET | Freq: Every day | ORAL | 2 refills | Status: DC

## 2020-05-26 NOTE — Addendum Note (Signed)
Encounter addended by: Hayden Pedro, PA-C on: 05/26/2020 10:01 AM  Actions taken: Order list changed, Diagnosis association updated, Clinical Note Signed

## 2020-05-26 NOTE — Progress Notes (Signed)
GYNECOLOGY  VISIT  CC:   Vaginal bleeding  HPI: 60 y.o. G1P1 Significant Other White or Caucasian female here for complaint of vaginal bleeding that she has noted twice with intercourse.  It is more mucous tinged with blood and not active bleeding.  Denies cramping or pelvic pain.    Pt has been diagnosed with breast cancer after recent MRI.  She has seen Dr. Marlou Starks.  Breast MRI is scheduled for tomorrow.  If all looks good, will have lumpectomy and sentinel node biopsy in about a month.  Currently, the plan will be for 4 weeks of radiation after the lumpectomy.  Currently, she does not think she will need chemotherapy.    Genetic testing has been recommended.  She and I discussed this today.  I feel she should try and have this done prior to her surgery.  Does not have appt yet.  I will reach out to Roma Kayser for her.    Lastly, pt is having some issues with all of the things that are going on right now.  Has been on Wellbutrin in the past and feels this would be helpful right now.  Dosage, side effects discussed.   Lastly, pt ready for referral for colonoscopy.  Had been referred in the past but delayed doing it.  Now need new referral.  GYNECOLOGIC HISTORY: Patient's last menstrual period was 07/21/2015. Contraception: PMP Menopausal hormone therapy: none  Patient Active Problem List   Diagnosis Date Noted  . Malignant neoplasm of lower-outer quadrant of left breast of female, estrogen receptor positive ([place]) 05/18/2020  . Hyperlipidemia 03/24/2020  . Mixed hyperlipidemia 03/24/2020  . Moderate recurrent major depression ([place]) 03/24/2020  . Snoring 03/24/2020  . Vitamin D deficiency 03/24/2020  . Lichen sclerosus et atrophicus 05/11/2019  . Sleep apnea   . Essential hypertension, benign 11/29/2015  . Morbid obesity ([place]) 08/21/2014    Past Medical History:  Diagnosis Date  . 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 Breast cancer ([place])  05/2020  . Depression   . Diabetes ([place]) 2022  . Gallbladder problem   . Hypertension   . Knee pain   . Obesity   . Other fatigue   . Shortness of breath on exertion   . Sleep apnea    using c-pap machine  . Vitamin D deficiency     Past Surgical History:  Procedure Laterality Date  . laparoscopy cholectomy  2001    MEDS:   Current Outpatient Medications on File Prior to Visit  Medication Sig Dispense Refill  . Cholecalciferol (VITAMIN D) 125 MCG (5000 UT) CAPS Take 1 capsule by mouth See admin instructions. Take 1 capsule by mouth three times per week.    . mometasone (ELOCON) 0.1 % ointment Apply topically as directed once or twice weekly 45 g 2  . valsartan-hydrochlorothiazide (DIOVAN-HCT) 160-12.5 MG tablet Take 1 tablet by mouth daily.    . Vitamin D, Ergocalciferol, (DRISDOL) 1.25 MG (50000 UNIT) CAPS capsule TAKE 1 CAPSULE BY MOUTH EVERY 7 DAYS 4 capsule 0  . polyethylene glycol (MIRALAX / GLYCOLAX) 17 g packet Take 17 g by mouth daily. (Patient not taking: Reported on 05/26/2020) 90 each 0   No current facility-administered medications on file prior to visit.    ALLERGIES: Bactrim [sulfamethoxazole-trimethoprim] and Penicillins  Family History  Problem Relation Age of Onset  . Hypertension Mother   . Hyperlipidemia Mother   . Heart disease Mother   .  Obesity Mother   . Hypertension Father   . Obesity Father   . Prostate cancer Father        prostate  . Renal cancer Father   . Pancreatic cancer Paternal Grandmother   . Pancreatic cancer Paternal Aunt   . Pancreatic cancer Maternal Grandmother     SH:  Single, non smoker  Review of Systems  Constitutional: Negative.   Gastrointestinal: Negative.   Genitourinary: Positive for vaginal bleeding.  Psychiatric/Behavioral: Positive for dysphoric mood.    PHYSICAL EXAMINATION:    BP (!) 149/93   Pulse 61   Ht 5\' 9"  (1.753 m)   Wt (!) 345 lb (156.5 kg)   LMP 07/21/2015   BMI 50.95 kg/m     General  appearance: alert, cooperative and appears stated age Abdomen: soft, non-tender; bowel sounds normal; no masses,  no organomegaly Lymph:  no inguinal LAD noted  Pelvic: External genitalia:  no lesions              Urethra:  normal appearing urethra with no masses, tenderness or lesions              Bartholins and Skenes: normal                 Vagina: normal appearing vagina with normal color and discharge, no lesions              Cervix: no lesions and bloody mucus at cervical os              Bimanual Exam:  Uterus:  normal size, contour, position, consistency, mobility, non-tender              Adnexa: no mass, fullness, tenderness   Endometrial biopsy recommended.  Discussed with patient.  Verbal and written consent obtained.   Procedure:  Speculum placed.  Cervix visualized and cleansed with betadine prep.  A single toothed tenaculum was applied to the anterior lip of the cervix.  Endometrial pipelle was advanced through the cervix into the endometrial cavity without difficulty.  Pipelle passed to 9cm.  Suction applied and pipelle removed with good tissue sample obtained.  Tenculum removed.  No bleeding noted.  Patient tolerated procedure well.  Chaperone, Acquanetta Chain, RN, was present for exam.  Assessment/Plan: 1. Postmenopausal bleeding - Surgical pathology( [place]/ [facility]) - possible causes discussed including uterine cancer, endometrial hyperplasia and endometrial polyps.  Will likely need some additional treatment with any of these.  Pt is comfortable with phone call regarding pathology and does not need return OV just to discuss pathology.  2. Colon cancer screening - Ambulatory referral to Gastroenterology  3. Mood changes - buPROPion (WELLBUTRIN XL) 150 MG 24 hr tablet; Take 1 tablet (150 mg total) by mouth daily.  Dispense: 30 tablet; Refill: 2 - pt to give me update in 2-3 weeks.  Will decide then if needs to be increased.  4.  Breast cancer - message sent to  Roma Kayser to see if can get genetic testing scheduled quickly for this pt.

## 2020-05-26 NOTE — Progress Notes (Addendum)
Radiation Oncology         [phone] ________________________________  Name: Anna Martinez        MRN: [id number]  Date of Service: 05/26/2020 DOB: November 14, 1960  QP:RFFM, Arbie Cookey, MD  Jovita Kussmaul, MD     REFERRING PHYSICIAN: Autumn Messing III, MD   DIAGNOSIS: The encounter diagnosis was Malignant neoplasm of lower-outer quadrant of left breast of female, estrogen receptor positive ([place]).   HISTORY OF PRESENT ILLNESS: Anna Martinez is a 60 y.o. female seen at the request of Dr. Marlou Starks for newly diagnosed left breast cancer.  The patient was found to have a screening detected abnormality in the left breast in the 5:00 location.  Further diagnostic imaging measured the lesion at 1.9 cm in her axilla was negative for adenopathy within the left underarm.  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  Her tumor was ER/PR positive HER2 was negative and Ki-67 was 10%.  Recommendations in breast oncology conference were for breast conservation, Oncotype Dx scoring to determine the role for systemic treatment adjuvant radiotherapy and antiestrogen medication.  She is scheduled tomorrow for an MRI given the lobular phenotype.    PREVIOUS RADIATION THERAPY: No   PAST MEDICAL HISTORY:  Past Medical History:  Diagnosis Date  . 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 Depression   . Gallbladder problem   . Hypertension   . Knee pain   . Obesity   . Other fatigue   . Shortness of breath on exertion   . Sleep apnea    using c-pap machine  . Vitamin D deficiency          PAST SURGICAL HISTORY: Past Surgical History:  Procedure Laterality Date  . laparoscopy cholectomy  2001     FAMILY HISTORY:  Family History  Problem Relation Age of Onset  . Hypertension Mother   . Hyperlipidemia Mother   . Heart disease Mother   . Obesity Mother   . Hypertension Father   . Cancer Father        prostate  .  Kidney disease Father   . Obesity Father      SOCIAL HISTORY:  reports that she has never smoked. She has never used smokeless tobacco. She reports that she does not drink alcohol and does not use drugs. The patient lives in [place]. She is accompanied by her friend Legrand Como. 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   ALLERGIES: Bactrim [sulfamethoxazole-trimethoprim] and Penicillins   MEDICATIONS:  Current Outpatient Medications  Medication Sig Dispense Refill  . Cholecalciferol (VITAMIN D) 125 MCG (5000 UT) CAPS Take 1 capsule by mouth See admin instructions. Take 1 capsule by mouth three times per week.    . mometasone (ELOCON) 0.1 % ointment Apply topically as directed once or twice weekly 45 g 2  . polyethylene glycol (MIRALAX / GLYCOLAX) 17 g packet Take 17 g by mouth daily. 90 each 0  . valsartan-hydrochlorothiazide (DIOVAN-HCT) 160-12.5 MG tablet Take 1 tablet by mouth daily.    . Vitamin D, Ergocalciferol, (DRISDOL) 1.25 MG (50000 UNIT) CAPS capsule Take 1 capsule (50,000 Units total) by mouth every 7 (seven) days. 4 capsule 0   No current facility-administered medications for this encounter.     REVIEW OF SYSTEMS: On review of systems, the patient reports that she had some bruising after  surgery but is doing well now. No complaints of pain are noted.    PHYSICAL EXAM:  Wt Readings from Last 3 Encounters:  05/05/20 (!) 340 lb (154.2 kg)  04/21/20 (!) 344 lb (156 kg)  04/07/20 (!) 353 lb (160.1 kg)   In general this is a well appearing she in no acute distress. She's alert and oriented x4 and appropriate throughout the examination. Cardiopulmonary assessment is negative for acute distress and she exhibits normal effort.    ECOG = 0  0 - Asymptomatic (Fully active, able to carry on all predisease activities without restriction)  1 - Symptomatic but completely ambulatory (Restricted in physically strenuous  activity but ambulatory and able to carry out work of a light or sedentary nature. For example, light housework, office work)  2 - Symptomatic, <50% in bed during the day (Ambulatory and capable of all self care but unable to carry out any work activities. Up and about more than 50% of waking hours)  3 - Symptomatic, >50% in bed, but not bedbound (Capable of only limited self-care, confined to bed or chair 50% or more of waking hours)  4 - Bedbound (Completely disabled. Cannot carry on any self-care. Totally confined to bed or chair)  5 - Death   Eustace Pen MM, Creech RH, Tormey DC, et al. [phone]). "Toxicity and response criteria of the [facility]". [place] Oncol. 5 (6): 649-55    LABORATORY DATA:  Lab Results  Component Value Date   WBC 9.0 04/07/2020   HGB 14.5 04/07/2020   HCT 44.6 04/07/2020   MCV 86 04/07/2020   PLT 316 04/07/2020   Lab Results  Component Value Date   NA 141 04/07/2020   K 4.0 04/07/2020   CL 101 04/07/2020   CO2 22 04/07/2020   Lab Results  Component Value Date   ALT 20 04/07/2020   AST 19 04/07/2020   ALKPHOS 143 (H) 04/07/2020   BILITOT 0.6 04/07/2020      RADIOGRAPHY: US BREAST LTD UNI LEFT INC AXILLA  Result Date: 05/09/2020 CLINICAL DATA:  Recall from 2D screening mammography, possible mass associated with architectural distortion involving the LOWER LEFT breast at middle depth. EXAM: DIGITAL DIAGNOSTIC UNILATERAL LEFT MAMMOGRAM WITH TOMOSYNTHESIS AND CAD; ULTRASOUND LEFT BREAST LIMITED TECHNIQUE: Left digital diagnostic mammography and breast tomosynthesis was performed. The images were evaluated with computer-aided detection.; Targeted ultrasound examination of the left breast was performed COMPARISON:  Previous exam(s). ACR Breast Density Category b: There are scattered areas of fibroglandular density. FINDINGS: Full field tomosynthesis and synthesized CC and MLO views were obtained. Tomosynthesis images confirm an isodense  mass with interspersed fat associated with architectural distortion in the LOWER breast at anterior to middle depth. There are no associated suspicious calcifications. Targeted ultrasound is performed, showing a vague heterogeneous though predominantly hypoechoic mass at the 5 o'clock position approximately 3 cm from nipple, associated with architectural distortion, measuring approximately 1.9 x 1.2 x 1.3 cm, demonstrating posterior acoustic shadowing and demonstrating peripheral power Doppler flow, corresponding to the screening mammographic. Sonographic evaluation of the LEFT axilla demonstrates no pathologic lymphadenopathy. IMPRESSION: 1. Suspicious 1.9 cm mass with associated architectural distortion involving the LOWER OUTER QUADRANT of the LEFT breast at the 5 o'clock position approximately 3 cm from nipple. 2. No pathologic LEFT axillary lymphadenopathy. RECOMMENDATION: Ultrasound-guided core needle biopsy of the suspicious LEFT breast mass. The ultrasound core needle biopsy procedure was discussed with patient and her questions were answered. She wishes proceed, and the biopsy  has been scheduled for tomorrow, 05/10/2020 at 7:30 a.m. I have discussed the findings and recommendations with the patient. BI-RADS CATEGORY  4: Suspicious. Electronically Signed   By: Evangeline Dakin M.D.   On: 05/09/2020 13:39   MM DIAG BREAST TOMO UNI LEFT  Result Date: 05/09/2020 CLINICAL DATA:  Recall from 2D screening mammography, possible mass associated with architectural distortion involving the LOWER LEFT breast at middle depth. EXAM: DIGITAL DIAGNOSTIC UNILATERAL LEFT MAMMOGRAM WITH TOMOSYNTHESIS AND CAD; ULTRASOUND LEFT BREAST LIMITED TECHNIQUE: Left digital diagnostic mammography and breast tomosynthesis was performed. The images were evaluated with computer-aided detection.; Targeted ultrasound examination of the left breast was performed COMPARISON:  Previous exam(s). ACR Breast Density Category b: There are  scattered areas of fibroglandular density. FINDINGS: Full field tomosynthesis and synthesized CC and MLO views were obtained. Tomosynthesis images confirm an isodense mass with interspersed fat associated with architectural distortion in the LOWER breast at anterior to middle depth. There are no associated suspicious calcifications. Targeted ultrasound is performed, showing a vague heterogeneous though predominantly hypoechoic mass at the 5 o'clock position approximately 3 cm from nipple, associated with architectural distortion, measuring approximately 1.9 x 1.2 x 1.3 cm, demonstrating posterior acoustic shadowing and demonstrating peripheral power Doppler flow, corresponding to the screening mammographic. Sonographic evaluation of the LEFT axilla demonstrates no pathologic lymphadenopathy. IMPRESSION: 1. Suspicious 1.9 cm mass with associated architectural distortion involving the LOWER OUTER QUADRANT of the LEFT breast at the 5 o'clock position approximately 3 cm from nipple. 2. No pathologic LEFT axillary lymphadenopathy. RECOMMENDATION: Ultrasound-guided core needle biopsy of the suspicious LEFT breast mass. The ultrasound core needle biopsy procedure was discussed with patient and her questions were answered. She wishes proceed, and the biopsy has been scheduled for tomorrow, 05/10/2020 at 7:30 a.m. I have discussed the findings and recommendations with the patient. BI-RADS CATEGORY  4: Suspicious. Electronically Signed   By: Evangeline Dakin M.D.   On: 05/09/2020 13:39   MM CLIP PLACEMENT LEFT  Result Date: 05/10/2020 CLINICAL DATA:  Status post ultrasound-guided biopsy of a mass with distortion in the LEFT breast at the 5 o'clock axis. EXAM: DIAGNOSTIC LEFT MAMMOGRAM POST ULTRASOUND BIOPSY COMPARISON:  Previous exam(s). FINDINGS: Mammographic images were obtained following ultrasound guided biopsy of the LEFT breast mass/distortion at the 5 o'clock axis. The biopsy marking clip is in expected position at  the site of biopsy. IMPRESSION: Appropriate positioning of the ribbon shaped biopsy marking clip at the site of biopsy in the lower outer quadrant of the LEFT breast corresponding to the targeted mass with distortion at the 5 o'clock axis. Final Assessment: Post Procedure Mammograms for Marker Placement Electronically Signed   By: Franki Cabot M.D.   On: 05/10/2020 08:55   [place] LT BREAST BX W LOC DEV 1ST LESION IMG BX SPEC US GUIDE  Addendum Date: 05/11/2020   ADDENDUM REPORT: 05/11/2020 13:53 ADDENDUM: Pathology revealed GRADE II INVASIVE MAMMARY CARCINOMA, MAMMARY CARCINOMA IN-SITU of the LEFT breast, 5 o'clock . This was found to be concordant by Dr. Franki Cabot. Pathology results were discussed with the patient by telephone. The patient reported doing well after the biopsy with tenderness at the site. Post biopsy instructions and care were reviewed and questions were answered. The patient was encouraged to call [facility] for any additional concerns. Surgical consultation has been arranged with Dr. Autumn Messing at [facility] on May 24, 2020. Pathology results reported by Stacie Acres RN on 05/11/2020. Electronically Signed   By:  Franki Cabot M.D.   On: 05/11/2020 13:53   Result Date: 05/11/2020 CLINICAL DATA:  Patient with a LEFT breast mass/distortion at the 5 o'clock axis presents today for ultrasound-guided core biopsy. EXAM: ULTRASOUND GUIDED LEFT BREAST CORE NEEDLE BIOPSY COMPARISON:  Previous exam(s). PROCEDURE: I met with the patient and we discussed the procedure of ultrasound-guided biopsy, including benefits and alternatives. We discussed the high likelihood of a successful procedure. We discussed the risks of the procedure, including infection, bleeding, tissue injury, clip migration, and inadequate sampling. Informed written consent was given. The usual time-out protocol was performed immediately prior to the procedure. Lesion quadrant: Lower outer quadrant  Using sterile technique and 1% Lidocaine as local anesthetic, under direct ultrasound visualization, a 12 gauge spring-loaded device was used to perform biopsy of the LEFT breast mass with distortion at the 5 o'clock axis using a lateral approach. At the conclusion of the procedure ribbon shaped tissue marker clip was deployed into the biopsy cavity. Follow up 2 view mammogram was performed and dictated separately. IMPRESSION: Ultrasound guided biopsy of the LEFT breast mass with distortion at the 5 o'clock axis. No apparent complications. Electronically Signed: By: Franki Cabot M.D. On: 05/10/2020 08:28       IMPRESSION/PLAN: 1. Stage IA, [id number] grade 2, ER/PR positive invasive lobular carcinoma of the left breast. Dr. Lisbeth Renshaw discusses the pathology findings and reviews the nature of early left breast disease. The consensus from the breast conference includes MRI which is scheduled for tomorrow.  Breast conservation with lumpectomy with  sentinel node biopsy is anticipated. Dr Burr Medico plans for  Oncotype Dx score to determine a role for systemic therapy. Provided that chemotherapy is not indicated, the patient's course would then be followed by external radiotherapy to the breast  to reduce risks of local recurrence followed by antiestrogen therapy. We discussed the risks, benefits, short, and long term effects of radiotherapy, as well as the curative intent, and the patient is interested in proceeding. Dr. Lisbeth Renshaw discusses the delivery and logistics of radiotherapy and anticipates a course of  4 weeks of radiotherapy with deep inspiration breath hold technique. We will see her back a few weeks after surgery to discuss the simulation process and anticipate we starting radiotherapy about 4-6 weeks after surgery.  2. Possible genetic predisposition to malignancy. The patient is a candidate for genetic testing given her personal and family history. She was offered referral and will be set up for a visit at a  convenient time.   This encounter was provided by telemedicine platform MyChart.  The patient has provided two factor identification and has given verbal consent for this type of encounter and has been advised to only accept a meeting of this type in a secure network environment. The time spent during this encounter was 60 minutes including preparation, discussion, and coordination of the patient's care. The attendants for this meeting include Blenda Nicely, RN, Dr. Lisbeth Renshaw, Hayden Pedro  and Catheryn Bacon and her friend Legrand Como.  During the encounter,  Blenda Nicely, RN, Dr. Lisbeth Renshaw, and Hayden Pedro were located at [facility] Radiation Oncology Department.  Anna Martinez was located at home with her friend Legrand Como.      Carola Rhine, [facility]   **Disclaimer: This note was dictated with voice recognition software. Similar sounding words can inadvertently be transcribed and this note may contain transcription errors which may not have been corrected upon publication of note.**

## 2020-05-26 NOTE — Patient Instructions (Signed)
[place], [place]: [phone]

## 2020-05-26 NOTE — Telephone Encounter (Signed)
Scheduled appt per 4/21 sch msg. Pt aware.  

## 2020-05-26 NOTE — Telephone Encounter (Signed)
Ok x 1

## 2020-05-27 ENCOUNTER — Ambulatory Visit (HOSPITAL_COMMUNITY)
Admission: RE | Admit: 2020-05-27 | Discharge: 2020-05-27 | Disposition: A | Discharge status: Home / Self Care | Payer: BC Managed Care – PPO | Source: Ambulatory Visit | Attending: General Surgery | Admitting: General Surgery

## 2020-05-27 ENCOUNTER — Encounter (HOSPITAL_COMMUNITY): Payer: Self-pay

## 2020-05-27 ENCOUNTER — Other Ambulatory Visit (HOSPITAL_BASED_OUTPATIENT_CLINIC_OR_DEPARTMENT_OTHER): Payer: Self-pay | Admitting: Obstetrics & Gynecology

## 2020-05-27 ENCOUNTER — Encounter: Payer: Self-pay | Admitting: General Practice

## 2020-05-27 DIAGNOSIS — C50512 Malignant neoplasm of lower-outer quadrant of left female breast: Secondary | ICD-10-CM

## 2020-05-27 LAB — SURGICAL PATHOLOGY

## 2020-05-27 NOTE — Progress Notes (Signed)
[place] Psychosocial Distress Screening Clinical Social Work  Clinical Social Work was referred by distress screening protocol.  The patient scored a 9 on the Psychosocial Distress Thermometer which indicates severe distress. Clinical Social Worker contacted patient by phone to assess for distress and other psychosocial needs. "Doing well today, not at concerned or worried now."  Has felt reassured by contact w various treatment providers.  Aware of upcoming appointments and surgery.  Is retired, is no concerned about work.  Wants in person group and/or counseling - will send her information on [facility].  She was advised to call if she would like brief in person counseling, followed by referral to outside counselor.  She was referred for an Hotel manager.    ONCBCN DISTRESS SCREENING 05/26/2020  Screening Type Initial Screening  Distress experienced in past week (1-10) 9  Emotional problem type Nervousness/Anxiety;Adjusting to illness  Information Concerns Type Lack of info about treatment;Lack of info about diagnosis  Other Contact via phone    Clinical Social Worker follow up needed: No.  If yes, follow up plan:  Beverely Pace, [place], LCSW Clinical Social Worker Phone:  [phone]

## 2020-05-27 NOTE — Addendum Note (Signed)
Addended by: Megan Salon on: 05/27/2020 01:52 PM   Modules accepted: Orders

## 2020-05-30 ENCOUNTER — Other Ambulatory Visit: Payer: Self-pay | Admitting: General Surgery

## 2020-05-30 ENCOUNTER — Other Ambulatory Visit: Payer: Self-pay | Admitting: Genetic Counselor

## 2020-05-30 ENCOUNTER — Encounter: Payer: Self-pay | Admitting: Nurse Practitioner

## 2020-05-30 ENCOUNTER — Inpatient Hospital Stay: Payer: BC Managed Care – PPO

## 2020-05-30 ENCOUNTER — Inpatient Hospital Stay (HOSPITAL_BASED_OUTPATIENT_CLINIC_OR_DEPARTMENT_OTHER): Payer: BC Managed Care – PPO | Admitting: Genetic Counselor

## 2020-05-30 ENCOUNTER — Inpatient Hospital Stay: Payer: BC Managed Care – PPO | Attending: Nurse Practitioner | Admitting: Nurse Practitioner

## 2020-05-30 ENCOUNTER — Other Ambulatory Visit: Payer: Self-pay

## 2020-05-30 ENCOUNTER — Encounter: Payer: Self-pay | Admitting: Genetic Counselor

## 2020-05-30 VITALS — BP 161/85 | HR 70 | Temp 97.5°F | Resp 20 | Ht 69.0 in | Wt 346.7 lb

## 2020-05-30 DIAGNOSIS — N939 Abnormal uterine and vaginal bleeding, unspecified: Secondary | ICD-10-CM

## 2020-05-30 DIAGNOSIS — C50512 Malignant neoplasm of lower-outer quadrant of left female breast: Secondary | ICD-10-CM

## 2020-05-30 DIAGNOSIS — E119 Type 2 diabetes mellitus without complications: Secondary | ICD-10-CM | POA: Diagnosis not present

## 2020-05-30 DIAGNOSIS — Z808 Family history of malignant neoplasm of other organs or systems: Secondary | ICD-10-CM

## 2020-05-30 DIAGNOSIS — Z85828 Personal history of other malignant neoplasm of skin: Secondary | ICD-10-CM | POA: Diagnosis not present

## 2020-05-30 DIAGNOSIS — Z170 Estrogen receptor positive status [ER+]: Secondary | ICD-10-CM

## 2020-05-30 DIAGNOSIS — I10 Essential (primary) hypertension: Secondary | ICD-10-CM | POA: Diagnosis not present

## 2020-05-30 DIAGNOSIS — Z8042 Family history of malignant neoplasm of prostate: Secondary | ICD-10-CM

## 2020-05-30 DIAGNOSIS — Z800 Family history of malignant neoplasm of digestive organs: Secondary | ICD-10-CM

## 2020-05-30 DIAGNOSIS — Z8051 Family history of malignant neoplasm of kidney: Secondary | ICD-10-CM

## 2020-05-30 DIAGNOSIS — Z803 Family history of malignant neoplasm of breast: Secondary | ICD-10-CM

## 2020-05-30 HISTORY — DX: Family history of malignant neoplasm of prostate: Z80.42

## 2020-05-30 HISTORY — DX: Family history of malignant neoplasm of other organs or systems: Z80.8

## 2020-05-30 HISTORY — DX: Family history of malignant neoplasm of digestive organs: Z80.0

## 2020-05-30 HISTORY — DX: Family history of malignant neoplasm of breast: Z80.3

## 2020-05-30 HISTORY — DX: Family history of malignant neoplasm of kidney: Z80.51

## 2020-05-30 LAB — GENETIC SCREENING ORDER

## 2020-05-30 NOTE — Progress Notes (Addendum)
[place]  [phone] [phone] Note   Patient Care Team: Maurice Small, MD as PCP - General (Family Medicine) 05/30/2020  CHIEF COMPLAINTS/PURPOSE OF CONSULTATION:  Left breast cancer, referred by Dr. Autumn Messing  Oncology History  Malignant neoplasm of lower-outer quadrant of left breast of female, estrogen receptor positive ([place] [facility])  05/09/2020 Breast US   Targeted ultrasound is performed, showing a vague heterogeneous though predominantly hypoechoic mass at the 5 o'clock position approximately 3 cm from nipple, associated with architectural distortion, measuring approximately 1.9 x 1.2 x 1.3 cm, demonstrating posterior acoustic shadowing and demonstrating peripheral power Doppler flow, corresponding to the screening mammographic.   Sonographic evaluation of the LEFT axilla demonstrates no pathologic lymphadenopathy.   IMPRESSION: 1. Suspicious 1.9 cm mass with associated architectural distortion involving the LOWER OUTER QUADRANT of the LEFT breast at the 5 o'clock position approximately 3 cm from nipple. 2. No pathologic LEFT axillary lymphadenopathy.   05/10/2020 Cancer Staging   Staging form: Breast, AJCC 8th Edition - Clinical stage from 05/10/2020: Stage IA (cT1c, cN0, cM0, G2, ER+, PR+, HER2-) - Signed by Gardenia Phlegm, NP on 05/18/2020 Histologic grading system: 3 grade system   05/10/2020 Initial Biopsy   Diagnosis Breast, left, needle core biopsy, 5 o'clock - INVASIVE MAMMARY CARCINOMA - MAMMARY CARCINOMA IN-SITU - SEE COMMENT Microscopic Comment The biopsy material shows an infiltrative proliferation of cells arranged linearly and in small clusters. Based on the biopsy, the carcinoma appears Nottingham grade 2 of 3 and measures 1.5 cm in greatest linear extent. The tumor cells are NEGATIVE for Her2 (1+).  Estrogen Receptor: 80%, POSITIVE, STRONG-MODERATE STAINING INTENSITY Progesterone Receptor: 90%,  POSITIVE, STRONG STAINING INTENSITY Proliferation Marker Ki67: 10% E-cadherin is NEGATIVE supporting lobular origin.   05/18/2020 Initial Diagnosis   Malignant neoplasm of lower-outer quadrant of left breast of female, estrogen receptor positive ([facility])     HISTORY OF PRESENTING ILLNESS:  Anna Martinez 60 y.o. female with DM, HTN, HL, lichen sclerosus, depression, and vitamin D deficiency is here because of newly diagnosed left breast cancer.  She underwent digital screening mammogram on 04/12/2020 showing breast density category B and a spiculated mass in the left breast. Previous mammogram was 2017, delayed due to COVID-19 pandemic. She did not palpate the mass herself. Diagnostic left mammo/ultrasound revealed a mass in the 5 o'clock position 3 cmfn measuring 1.9 x 1.2 x 1.3 cm without axillary adenopathy.  Biopsy on 05/10/2020 Path revealed invasive mammary carcinoma with mammary carcinoma in situ, E-cadherin negative supporting a lobular histology.  Prognostic indicators showed ER 80% positive with strong staining, PR 90% positive with strong staining, HER2 negative (1+), with a Ki-67 of 10%, and Nottingham grade 2 of 3.  She was seen by [facility] surgeon Dr. Marlou Starks who felt breast conserving lumpectomy and sentinel lymph node biopsy were reasonable.  She was also seen by radiation oncology Dr. Lisbeth Renshaw and Worthy Flank, PA who will recommend adjuvant radiation when appropriate.  She was referred to [place] for discussion of medical therapy and surveillance follow-up.  Socially, she lives with her boyfriend and a cat.  She is a retired Metallurgist.  She is independent with all ADLs.  She denies tobacco, recreational drug, or alcohol use.  She is undergoing GYN work-up for abnormal vaginal bleeding x3 weeks managed by Dr. Edwinna Areola.  A recent endometrial biopsy was negative for malignancy.  She has never had a colonoscopy.  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  Her family history is  significant for father with history of prostate, kidney, skin, and head/neck (palate) cancer; maternal uncle had melanoma; paternal aunt had pancreas, and that person's daughter had breast cancer in her 66s now cancer free.  She is scheduled for genetic testing later today.  GYN HISTORY  Menarchal: 11 LMP: age [age] HRT: 2 years after menopause, estrace G1P1, healthy daughter  Anna Martinez presents today with her sister.  She has had abnormal vaginal bleeding for about 3 weeks, ultrasound scheduled later this week.  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  Denies fatigue, unintentional weight loss, abdominal pain, change in bowel habits, bleeding, fever, chills, cough, chest pain, dyspnea.  ROS otherwise negative.  MEDICAL HISTORY:  Past Medical History:  Diagnosis Date  . 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 Breast cancer ([place]) 05/2020  . Depression   . Diabetes ([place]) 2022  . Gallbladder problem   . Hypertension   . Knee pain   . Obesity   . Other fatigue   . Shortness of breath on exertion   . Sleep apnea    using c-pap machine  . Vitamin D deficiency     SURGICAL HISTORY: Past Surgical History:  Procedure Laterality Date  . laparoscopy cholectomy  2001    SOCIAL HISTORY: Social History   Socioeconomic History  . Marital status: Significant Other    Spouse name: Legrand Como  . Number of children: Not on file  . Years of education: Not on file  . Highest education level: Not on file  Occupational History  . 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 Smoking status: Never Smoker  . Smokeless tobacco: Never Used  Vaping Use  . Vaping Use: Never used  Substance and Sexual Activity  . Alcohol use: No  . Drug use: No  . Sexual activity: Yes    Partners: Male    Birth control/protection: Post-menopausal  Other Topics Concern  . Not on file  Social History Narrative   . Not on file   Social Determinants of Health   Financial Resource Strain: Not on file  Food Insecurity: Not on file  Transportation Needs: No Transportation Needs  . Lack of Transportation (Medical): No  . Lack of Transportation (Non-Medical): No  Physical Activity: Not on file  Stress: Not on file  Social Connections: Not on file  Intimate Partner Violence: Not At Risk  . Fear of Current or Ex-Partner: No  . Emotionally Abused: No  . Physically Abused: No  . Sexually Abused: No    FAMILY HISTORY: Family History  Problem Relation Age of Onset  . Hypertension Mother   . Hyperlipidemia Mother   . Heart disease Mother   . Obesity Mother   . Hypertension Father   . Obesity Father   . Prostate cancer Father        prostate  . Renal cancer Father   . Pancreatic cancer Paternal Grandmother   . Pancreatic cancer Paternal Aunt   . Pancreatic cancer Maternal Grandmother     ALLERGIES:  is allergic to bactrim [sulfamethoxazole-trimethoprim] and penicillins.  MEDICATIONS:  Current Outpatient Medications  Medication Sig Dispense Refill  . buPROPion (WELLBUTRIN XL) 150 MG 24 hr tablet Take 1 tablet (150 mg total) by mouth daily. 30 tablet 2  . Cholecalciferol (VITAMIN D) 125 MCG (5000 UT) CAPS Take 1 capsule by  mouth See admin instructions. Take 1 capsule by mouth three times per week.    . mometasone (ELOCON) 0.1 % ointment Apply topically as directed once or twice weekly 45 g 2  . polyethylene glycol (MIRALAX / GLYCOLAX) 17 g packet Take 17 g by mouth daily. (Patient not taking: Reported on 05/26/2020) 90 each 0  . valsartan-hydrochlorothiazide (DIOVAN-HCT) 160-12.5 MG tablet Take 1 tablet by mouth daily.    . Vitamin D, Ergocalciferol, (DRISDOL) 1.25 MG (50000 UNIT) CAPS capsule TAKE 1 CAPSULE BY MOUTH EVERY 7 DAYS 4 capsule 0   No current facility-administered medications for this visit.    REVIEW OF SYSTEMS:   Constitutional: Denies fevers, chills or abnormal night  sweats Eyes: Denies blurriness of vision, double vision or watery eyes Ears, nose, mouth, throat, and face: Denies mucositis or sore throat Respiratory: Denies cough, dyspnea or wheezes Cardiovascular: Denies palpitation, chest discomfort or lower extremity swelling Gastrointestinal:  Denies nausea, heartburn or change in bowel habits GYN: (+) abnormal vaginal bleeding x3 weeks Skin: Denies abnormal skin rashes (+) squamous cell removed from right arm Lymphatics: Denies new lymphadenopathy or easy bruising Neurological:Denies numbness, tingling or new weaknesses Behavioral/Psych: Mood is stable, no new changes (+) depression All other systems were reviewed with the patient and are negative.  PHYSICAL EXAMINATION: ECOG PERFORMANCE STATUS: 0 - Asymptomatic  Vitals:   05/30/20 1052  BP: (!) 161/85  Pulse: 70  Resp: 20  Temp: (!) 97.5 F (36.4 C)  SpO2: 99%   Filed Weights   05/30/20 1052  Weight: (!) 346 lb 11.2 oz (157.3 kg)    GENERAL:alert, no distress and comfortable SKIN: skin color, texture, turgor are normal, no rashes or significant lesions EYES: sclera clear NECK: Without mass LYMPH:  no palpable cervical or supraclavicular lymphadenopathy  LUNGS: clear to auscultation with normal breathing effort HEART: regular rate & rhythm, no lower extremity edema ABDOMEN:abdomen soft, non-tender and normal bowel sounds.  No hepatomegaly or palpable mass Musculoskeletal:no cyanosis of digits and no clubbing  PSYCH: alert & oriented x 3 with fluent speech NEURO: no focal motor/sensory deficits Breast: Pendulous breasts are symmetrical without nipple discharge or inversion.  S/p left lower outer quadrant biopsy with very slight bruising overlying soft tissue density without palpable mass in either breast or axilla that I could appreciate  LABORATORY DATA:  I have reviewed the data as listed CBC Latest Ref Rng & Units 04/07/2020  WBC 3.4 - 10.8 x10E3/uL 9.0  Hemoglobin 11.1 - 15.9  g/dL 14.5  Hematocrit 34.0 - 46.6 % 44.6  Platelets 150 - 450 x10E3/uL 316   CMP Latest Ref Rng & Units 04/07/2020  Glucose 65 - 99 mg/dL 116(H)  BUN 8 - 27 mg/dL 14  Creatinine 0.57 - 1.00 mg/dL 0.84  Sodium 134 - 144 mmol/L 141  Potassium 3.5 - 5.2 mmol/L 4.0  Chloride 96 - 106 mmol/L 101  CO2 20 - 29 mmol/L 22  Calcium 8.7 - 10.3 mg/dL 9.5  Total Protein 6.0 - 8.5 g/dL 7.0  Total Bilirubin 0.0 - 1.2 mg/dL 0.6  Alkaline Phos 44 - 121 IU/L 143(H)  AST 0 - 40 IU/L 19  ALT 0 - 32 IU/L 20    RADIOGRAPHIC STUDIES: I have personally reviewed the radiological images as listed and agreed with the findings in the report. US BREAST LTD UNI LEFT INC AXILLA  Result Date: 05/09/2020 CLINICAL DATA:  Recall from 2D screening mammography, possible mass associated with architectural distortion involving the LOWER LEFT breast at middle  depth. EXAM: DIGITAL DIAGNOSTIC UNILATERAL LEFT MAMMOGRAM WITH TOMOSYNTHESIS AND CAD; ULTRASOUND LEFT BREAST LIMITED TECHNIQUE: Left digital diagnostic mammography and breast tomosynthesis was performed. The images were evaluated with computer-aided detection.; Targeted ultrasound examination of the left breast was performed COMPARISON:  Previous exam(s). ACR Breast Density Category b: There are scattered areas of fibroglandular density. FINDINGS: Full field tomosynthesis and synthesized CC and MLO views were obtained. Tomosynthesis images confirm an isodense mass with interspersed fat associated with architectural distortion in the LOWER breast at anterior to middle depth. There are no associated suspicious calcifications. Targeted ultrasound is performed, showing a vague heterogeneous though predominantly hypoechoic mass at the 5 o'clock position approximately 3 cm from nipple, associated with architectural distortion, measuring approximately 1.9 x 1.2 x 1.3 cm, demonstrating posterior acoustic shadowing and demonstrating peripheral power Doppler flow, corresponding to the  screening mammographic. Sonographic evaluation of the LEFT axilla demonstrates no pathologic lymphadenopathy. IMPRESSION: 1. Suspicious 1.9 cm mass with associated architectural distortion involving the LOWER OUTER QUADRANT of the LEFT breast at the 5 o'clock position approximately 3 cm from nipple. 2. No pathologic LEFT axillary lymphadenopathy. RECOMMENDATION: Ultrasound-guided core needle biopsy of the suspicious LEFT breast mass. The ultrasound core needle biopsy procedure was discussed with patient and her questions were answered. She wishes proceed, and the biopsy has been scheduled for tomorrow, 05/10/2020 at 7:30 a.m. I have discussed the findings and recommendations with the patient. BI-RADS CATEGORY  4: Suspicious. Electronically Signed   By: Evangeline Dakin M.D.   On: 05/09/2020 13:39   MM DIAG BREAST TOMO UNI LEFT  Result Date: 05/09/2020 CLINICAL DATA:  Recall from 2D screening mammography, possible mass associated with architectural distortion involving the LOWER LEFT breast at middle depth. EXAM: DIGITAL DIAGNOSTIC UNILATERAL LEFT MAMMOGRAM WITH TOMOSYNTHESIS AND CAD; ULTRASOUND LEFT BREAST LIMITED TECHNIQUE: Left digital diagnostic mammography and breast tomosynthesis was performed. The images were evaluated with computer-aided detection.; Targeted ultrasound examination of the left breast was performed COMPARISON:  Previous exam(s). ACR Breast Density Category b: There are scattered areas of fibroglandular density. FINDINGS: Full field tomosynthesis and synthesized CC and MLO views were obtained. Tomosynthesis images confirm an isodense mass with interspersed fat associated with architectural distortion in the LOWER breast at anterior to middle depth. There are no associated suspicious calcifications. Targeted ultrasound is performed, showing a vague heterogeneous though predominantly hypoechoic mass at the 5 o'clock position approximately 3 cm from nipple, associated with architectural  distortion, measuring approximately 1.9 x 1.2 x 1.3 cm, demonstrating posterior acoustic shadowing and demonstrating peripheral power Doppler flow, corresponding to the screening mammographic. Sonographic evaluation of the LEFT axilla demonstrates no pathologic lymphadenopathy. IMPRESSION: 1. Suspicious 1.9 cm mass with associated architectural distortion involving the LOWER OUTER QUADRANT of the LEFT breast at the 5 o'clock position approximately 3 cm from nipple. 2. No pathologic LEFT axillary lymphadenopathy. RECOMMENDATION: Ultrasound-guided core needle biopsy of the suspicious LEFT breast mass. The ultrasound core needle biopsy procedure was discussed with patient and her questions were answered. She wishes proceed, and the biopsy has been scheduled for tomorrow, 05/10/2020 at 7:30 a.m. I have discussed the findings and recommendations with the patient. BI-RADS CATEGORY  4: Suspicious. Electronically Signed   By: Evangeline Dakin M.D.   On: 05/09/2020 13:39   MM CLIP PLACEMENT LEFT  Result Date: 05/10/2020 CLINICAL DATA:  Status post ultrasound-guided biopsy of a mass with distortion in the LEFT breast at the 5 o'clock axis. EXAM: DIAGNOSTIC LEFT MAMMOGRAM POST ULTRASOUND BIOPSY COMPARISON:  Previous exam(s). FINDINGS:  Mammographic images were obtained following ultrasound guided biopsy of the LEFT breast mass/distortion at the 5 o'clock axis. The biopsy marking clip is in expected position at the site of biopsy. IMPRESSION: Appropriate positioning of the ribbon shaped biopsy marking clip at the site of biopsy in the lower outer quadrant of the LEFT breast corresponding to the targeted mass with distortion at the 5 o'clock axis. Final Assessment: Post Procedure Mammograms for Marker Placement Electronically Signed   By: Franki Cabot M.D.   On: 05/10/2020 08:55   [place] LT BREAST BX W LOC DEV 1ST LESION IMG BX SPEC US GUIDE  Addendum Date: 05/11/2020   ADDENDUM REPORT: 05/11/2020 13:53 ADDENDUM: Pathology  revealed GRADE II INVASIVE MAMMARY CARCINOMA, MAMMARY CARCINOMA IN-SITU of the LEFT breast, 5 o'clock . This was found to be concordant by Dr. Franki Cabot. Pathology results were discussed with the patient by telephone. The patient reported doing well after the biopsy with tenderness at the site. Post biopsy instructions and care were reviewed and questions were answered. The patient was encouraged to call [facility] for any additional concerns. Surgical consultation has been arranged with Dr. Autumn Messing at [facility] on May 24, 2020. Pathology results reported by Stacie Acres RN on 05/11/2020. Electronically Signed   By: Franki Cabot M.D.   On: 05/11/2020 13:53   Result Date: 05/11/2020 CLINICAL DATA:  Patient with a LEFT breast mass/distortion at the 5 o'clock axis presents today for ultrasound-guided core biopsy. EXAM: ULTRASOUND GUIDED LEFT BREAST CORE NEEDLE BIOPSY COMPARISON:  Previous exam(s). PROCEDURE: I met with the patient and we discussed the procedure of ultrasound-guided biopsy, including benefits and alternatives. We discussed the high likelihood of a successful procedure. We discussed the risks of the procedure, including infection, bleeding, tissue injury, clip migration, and inadequate sampling. Informed written consent was given. The usual time-out protocol was performed immediately prior to the procedure. Lesion quadrant: Lower outer quadrant Using sterile technique and 1% Lidocaine as local anesthetic, under direct ultrasound visualization, a 12 gauge spring-loaded device was used to perform biopsy of the LEFT breast mass with distortion at the 5 o'clock axis using a lateral approach. At the conclusion of the procedure ribbon shaped tissue marker clip was deployed into the biopsy cavity. Follow up 2 view mammogram was performed and dictated separately. IMPRESSION: Ultrasound guided biopsy of the LEFT breast mass with distortion at the 5 o'clock axis.  No apparent complications. Electronically Signed: By: Franki Cabot M.D. On: 05/10/2020 08:28    ASSESSMENT & PLAN: 60 yo postmenopausal female   1. Malignant neoplasm in left lower outer breast - invasive lobular carcinoma, grade II, ER/PR positive HER2 negative, Ki67 10%, stage I -We discussed her imaging findings and the biopsy results in great detail. -Given the early stage disease, she is a candidate for breast conserving lumpectomy and SLN biopsy. She is agreeable with that and has seen Dr. Marlou Starks. Breast MRI is pending, then a surgical date will be set.   -Given her 1.5 cm grade II tumor, we recommend a Oncotype Dx test on the surgical sample to stratify her recurrence risk and we'll make a decision about adjuvant chemotherapy based on the Oncotype result. Written material of this test was given to her. She is young but she has multiple co-morbidities, chemotherapy would be challenging. We also discussed lobular histology is less sensitive to chemo in general. She wants to know her risk, if oncotype is high, we'll discuss chemo. -If her surgical  sentinel lymph node node positive, we recommend mammaprint for further risk stratification and guide adjuvant chemotherapy. -Given the strong ER and PR expression in her postmenopausal status, we recommend adjuvant endocrine therapy with aromatase inhibitor for a total of 5-10 years to reduce the risk of cancer recurrence. Potential benefits and side effects were discussed with patient, she is reluctant due to side effect profile but open to it.   -She was seen by radiation oncologist Dr. Lisbeth Renshaw on 05/26/20 today, she is agreeable to adjuvant radiation.  -We also discussed the breast cancer surveillance after her surgery. She will continue annual screening mammogram, self exam, and a routine office visit with lab and exam with [place].  2. Abnormal vaginal bleeding -onset 3 weeks ago, mostly post-coital from ob/gyn notes; work up is in progress -endometrial  biopsy is negative for malignancy -[place] this week, per Dr. Sabra Heck  3. Genetics -patient has personal history of sq.cell skin and now breast cancer. Her father had multiple cancers (prostate, kidney, skin, and palate), and she has family history of pancreas cancer and breast cancer in 61 year old cousin. She meets criteria -testing scheduled today  4. HTN, HL, DM, arthritis  -per PCP Dr. 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   5. Health maintenance  -she is UTD on cervical cancer screening, but never had colonoscopy. I plan to refer her after breast cancer treatment  -she is under care of Dr. Jearld Lesch for medical weight management  PLAN: -Work up reviewed -Breast MRI 4/26 -Genetic testing today -Proceed with surgery per Dr. Marlou Starks, with oncotype (or mammaprint if LN+) -F/up end of RT, unless oncotype is high risk then will see her back to discuss adjuvant chemo -Continue ob/gyn work up per Dr. Sabra Heck   Orders Placed This Encounter  Procedures  . CBC with Differential (Cancer Center Only)    Standing Status:   Standing    Number of Occurrences:   20    Standing Expiration Date:   05/30/2021  . CMP ([facility] only)    Standing Status:   Standing    Number of Occurrences:   20    Standing Expiration Date:   05/30/2021    All questions were answered. The patient knows to call the clinic with any problems, questions or concerns.     Alla Feeling, NP 05/30/2020   Addendum  I have seen the patient, examined her. I agree with the assessment and and plan and have edited the notes.   Anna Martinez is a 59 year old female with past medical history of obesity, hypertension, diabetes, hyperlipidemia, presented with screening discovered left breast lobular carcinoma, stage I, ER/PR positive, HER2 negative.  She is scheduled for breast MRI tomorrow, and the plan to proceed with surgery with Dr. Marlou Starks.  I discussed the risk of recurrence after  completed surgical resection, and recommend Oncotype DX for risk stratification.  I also recommend adjuvant antiestrogen therapy.  Patient is concerned about the potential side effect from antiestrogen therapy, but is willing to try.  She would also benefit from adjuvant radiation if she has lumpectomy and sentinel lymph node biopsy.  All questions were answered, I plan to see her back after radiation, or sooner if needed.  Truitt Merle  05/30/2020

## 2020-05-30 NOTE — Progress Notes (Signed)
REFERRING PROVIDER: Hayden Pedro, PA-C [place],  [place] [address]  PRIMARY PROVIDER:  Maurice Small, MD  PRIMARY REASON FOR VISIT:  1. Malignant neoplasm of lower-outer quadrant of left breast of female, estrogen receptor positive ([place])   2. Family history of breast cancer   3. Family history of prostate cancer   4. Family history of pancreatic cancer   5. Family history of skin cancer   6. Family history of renal cancer      HISTORY OF PRESENT ILLNESS:   Ms. Krone, a 60 y.o. female, was seen for a [facility] cancer genetics consultation at the request of Shona Simpson, PA-C due to a personal and family history of cancer.  Ms. Pong presents to clinic today to discuss the possibility of a hereditary predisposition to cancer, to discuss genetic testing, and to further clarify her future cancer risks, as well as potential cancer risks for family members.   In 2022, at the age of 33, Ms. 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 The treatment plan is pending.  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   CANCER HISTORY:  Oncology History  Malignant neoplasm of lower-outer quadrant of left breast of female, estrogen receptor positive ([place])  05/09/2020 Breast US   Targeted ultrasound is performed, showing a vague heterogeneous though predominantly hypoechoic mass at the 5 o'clock position approximately 3 cm from nipple, associated with architectural distortion, measuring approximately 1.9 x 1.2 x 1.3 cm, demonstrating posterior acoustic shadowing and demonstrating peripheral power Doppler flow, corresponding to the screening mammographic.   Sonographic evaluation of the LEFT axilla demonstrates no pathologic lymphadenopathy.   IMPRESSION: 1. Suspicious 1.9 cm mass with associated architectural distortion involving the LOWER OUTER QUADRANT of the LEFT breast at the 5 o'clock position approximately  3 cm from nipple. 2. No pathologic LEFT axillary lymphadenopathy.   05/10/2020 Cancer Staging   Staging form: Breast, AJCC 8th Edition - Clinical stage from 05/10/2020: Stage IA (cT1c, cN0, cM0, G2, ER+, PR+, HER2-) - Signed by Gardenia Phlegm, NP on 05/18/2020 Histologic grading system: 3 grade system   05/10/2020 Initial Biopsy   Diagnosis Breast, left, needle core biopsy, 5 o'clock - INVASIVE MAMMARY CARCINOMA - MAMMARY CARCINOMA IN-SITU - SEE COMMENT Microscopic Comment The biopsy material shows an infiltrative proliferation of cells arranged linearly and in small clusters. Based on the biopsy, the carcinoma appears Nottingham grade 2 of 3 and measures 1.5 cm in greatest linear extent. The tumor cells are NEGATIVE for Her2 (1+).  Estrogen Receptor: 80%, POSITIVE, STRONG-MODERATE STAINING INTENSITY Progesterone Receptor: 90%, POSITIVE, STRONG STAINING INTENSITY Proliferation Marker Ki67: 10% E-cadherin is NEGATIVE supporting lobular origin.   05/18/2020 Initial Diagnosis   Malignant neoplasm of lower-outer quadrant of left breast of female, estrogen receptor positive ([place])    RISK FACTORS:  Menarche was at age 12.  First live birth at age 39.  OCP use for approximately 10 years.  Ovaries intact: yes.  Hysterectomy: no.  Menopausal status: postmenopausal.  Colonoscopy: no; not examined. Mammogram within the last year: yes. Number of breast biopsies: 1. Up to date with pelvic exams: yes.   Past Medical History:  Diagnosis Date  . 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 Breast cancer ([place]) 05/2020  . Depression   . Diabetes ([place]) 2022  . Family history of breast cancer 05/30/2020  . Family history of pancreatic  cancer 05/30/2020  . Family history of prostate cancer 05/30/2020  . Family history of renal cancer 05/30/2020  . Family history of skin cancer 05/30/2020  . Gallbladder problem   . Hypertension   . Knee pain   . Obesity    . Other fatigue   . Shortness of breath on exertion   . Sleep apnea    using c-pap machine  . Vitamin D deficiency     Past Surgical History:  Procedure Laterality Date  . laparoscopy cholectomy  2001    Social History   Socioeconomic History  . Marital status: Significant Other    Spouse name: Legrand Como  . Number of children: Not on file  . Years of education: Not on file  . Highest education level: Not on file  Occupational History  . 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 Smoking status: Never Smoker  . Smokeless tobacco: Never Used  Vaping Use  . Vaping Use: Never used  Substance and Sexual Activity  . Alcohol use: No  . Drug use: No  . Sexual activity: Yes    Partners: Male    Birth control/protection: Post-menopausal  Other Topics Concern  . Not on file  Social History Narrative  . Not on file   Social Determinants of Health   Financial Resource Strain: Not on file  Food Insecurity: Not on file  Transportation Needs: No Transportation Needs  . Lack of Transportation (Medical): No  . Lack of Transportation (Non-Medical): No  Physical Activity: Not on file  Stress: Not on file  Social Connections: Not on file     FAMILY HISTORY:  We obtained a detailed, 4-generation family history.  Significant diagnoses are listed below: Family History  Problem Relation Age of Onset  . Skin cancer Mother   . Prostate cancer Father        dx 79s  . Renal cancer Father        dx 71s  . Cancer Father        mouth; dx after 78  . Skin cancer Father        dx after 36  . Cancer Paternal Grandmother        unknown type; ? pancreatic; dx after 51  . Pancreatic cancer Paternal Aunt        dx 63s  . Skin cancer Maternal Grandmother        face  . Melanoma Maternal Uncle        mets; dx 82s  . Cancer Paternal Uncle        unknown type; dx unknown age  . Breast cancer Cousin        dx 64s; paternal cousin    Ms.  Therriault is unaware of previous family history of genetic testing for hereditary cancer risks. Patient's maternal ancestors are of [place] descent, and paternal ancestors are of [place] descent. There is no reported Ashkenazi Jewish ancestry. There is no known consanguinity.  GENETIC COUNSELING ASSESSMENT: Ms. Agrusa is a 60 y.o. female with a personal and family history of cancer which is somewhat suggestive of a hereditary cancer syndrome and predisposition to cancer given the presence of related cancers in her paternal family. We, therefore, discussed and recommended the following at today's visit.   DISCUSSION: We discussed that 5 - 10% of cancer is hereditary, with most cases of hereditary breast cancer associated with mutations in BRCA1/2.  There are other genes that can be associated with hereditary  breast and pancreatic cancer syndromes.  These include but are not limited to CHEK2, ATM, and PALB2. Type of cancer risk and level of risk are gene-specific. We discussed that testing is beneficial for several reasons including knowing how to follow individuals after completing their treatment, identifying whether potential treatment options would be beneficial, and understanding if other family members could be at risk for cancer and allowing them to undergo genetic testing.   We reviewed the characteristics, features and inheritance patterns of hereditary cancer syndromes. We also discussed genetic testing, including the appropriate family members to test, the process of testing, insurance coverage and turn-around-time for results. We discussed the implications of a negative, positive and/or variant of uncertain significant result. In order to get genetic test results in a timely manner so that Ms. Feggins can use these genetic test results for surgical decisions, we recommended Ms. Bouton pursue genetic testing for the [facility] BRCAPlus Panel.  The BRCAplus panel offered by [employer] and includes sequencing  and deletion/duplication analysis for the following 8 genes: ATM, BRCA1, BRCA2, CDH1, CHEK2, PALB2, PTEN, and TP53.  Once complete, we recommend Ms. Meiklejohn pursue reflex genetic testing to a more comprehensive gene panel, which includes genes assocaited with pancreatic cancer, skin cancer, prostate cancer, and renal cancer.   The CancerNext-Expanded gene panel offered by [facility] and includes sequencing, rearrangement, and RNA analysis for the following 77 genes: AIP, ALK, APC, ATM, AXIN2, BAP1, BARD1, BLM, BMPR1A, BRCA1, BRCA2, BRIP1, CDC73, CDH1, CDK4, CDKN1B, CDKN2A, CHEK2, CTNNA1, DICER1, FANCC, FH, FLCN, GALNT12, KIF1B, LZTR1, MAX, MEN1, MET, MLH1, MSH2, MSH3, MSH6, MUTYH, NBN, NF1, NF2, NTHL1, PALB2, PHOX2B, PMS2, POT1, PRKAR1A, PTCH1, PTEN, RAD51C, RAD51D, RB1, RECQL, RET, SDHA, SDHAF2, SDHB, SDHC, SDHD, SMAD4, SMARCA4, SMARCB1, SMARCE1, STK11, SUFU, TMEM127, TP53, TSC1, TSC2, VHL and XRCC2 (sequencing and deletion/duplication); EGFR, EGLN1, HOXB13, KIT, MITF, PDGFRA, POLD1, and POLE (sequencing only); EPCAM and GREM1 (deletion/duplication only).   Based on Ms. Buenaventura personal and family history of cancer, she meets medical criteria for genetic testing. Despite that she meets criteria, she may still have an out of pocket cost. We discussed that if her out of pocket cost for testing is over $100, the laboratory should contact her to discuss self-pay prices, patient pay assistance programs, if applicable, and other billing options.   PLAN: After considering the risks, benefits, and limitations, Ms. Gimpel provided informed consent to pursue genetic testing and the blood sample was sent to [facility] for analysis of the [facility] +RNAinsight Panel. Results should be available within approximately 1-2 weeks' time, at which point they will be disclosed by telephone to Ms. Leatherwood, as will any additional recommendations warranted by these results. Ms. Stanczak will receive a  summary of her genetic counseling visit and a copy of her results once available. This information will also be available in Epic.   Lastly, we encouraged Ms. Banas to remain in contact with cancer genetics annually so that we can continuously update the family history and inform her of any changes in cancer genetics and testing that may be of benefit for this family.   Ms. Bryngelson questions were answered to her satisfaction today. Our contact information was provided should additional questions or concerns arise. Thank you for the referral and allowing [place] to share in the care of your patient.   Delpha Perko M. Joette Catching, [place], [facility] Genetic Counselor [email or web address] (P) [phone]  The patient was seen for a total of 40 minutes in face-to-face genetic counseling.  Drs. Magrinat,  Lindi Adie and/or Burr Medico were available to discuss this case as needed.  _______________________________________________________________________ For Office Staff:  Number of people involved in session: 1 Was an Intern/ student involved with case: no

## 2020-05-31 ENCOUNTER — Ambulatory Visit
Admission: RE | Admit: 2020-05-31 | Discharge: 2020-05-31 | Disposition: A | Discharge status: Home / Self Care | Payer: BC Managed Care – PPO | Source: Ambulatory Visit | Attending: General Surgery | Admitting: General Surgery

## 2020-05-31 ENCOUNTER — Telehealth: Payer: Self-pay | Admitting: *Deleted

## 2020-05-31 ENCOUNTER — Encounter: Payer: Self-pay | Admitting: *Deleted

## 2020-05-31 DIAGNOSIS — C50512 Malignant neoplasm of lower-outer quadrant of left female breast: Secondary | ICD-10-CM

## 2020-05-31 MED ORDER — GADOBUTROL 1 MMOL/ML IV SOLN
10.0000 mL | Freq: Once | INTRAVENOUS | Status: AC | PRN
  Administered 2020-05-31: 10 mL via INTRAVENOUS

## 2020-05-31 NOTE — Telephone Encounter (Signed)
Spoke with patient to follow up from new patient appt and assess navigation needs. Gave contact information and encouraged her to call with any questions or concerns. Patient verbalized understanding.

## 2020-06-01 ENCOUNTER — Other Ambulatory Visit: Payer: Self-pay

## 2020-06-01 ENCOUNTER — Ambulatory Visit (INDEPENDENT_AMBULATORY_CARE_PROVIDER_SITE_OTHER): Payer: BC Managed Care – PPO | Admitting: Obstetrics & Gynecology

## 2020-06-01 ENCOUNTER — Ambulatory Visit (HOSPITAL_BASED_OUTPATIENT_CLINIC_OR_DEPARTMENT_OTHER)
Admission: RE | Admit: 2020-06-01 | Discharge: 2020-06-01 | Disposition: A | Discharge status: Home / Self Care | Payer: BC Managed Care – PPO | Source: Ambulatory Visit | Attending: Obstetrics & Gynecology | Admitting: Obstetrics & Gynecology

## 2020-06-01 ENCOUNTER — Encounter (HOSPITAL_BASED_OUTPATIENT_CLINIC_OR_DEPARTMENT_OTHER): Payer: Self-pay | Admitting: Obstetrics & Gynecology

## 2020-06-01 VITALS — BP 178/90 | HR 58 | Wt 345.6 lb

## 2020-06-01 DIAGNOSIS — N950 Postmenopausal bleeding: Secondary | ICD-10-CM | POA: Insufficient documentation

## 2020-06-01 DIAGNOSIS — R9389 Abnormal findings on diagnostic imaging of other specified body structures: Secondary | ICD-10-CM

## 2020-06-01 DIAGNOSIS — Z170 Estrogen receptor positive status [ER+]: Secondary | ICD-10-CM | POA: Diagnosis not present

## 2020-06-01 DIAGNOSIS — C50512 Malignant neoplasm of lower-outer quadrant of left female breast: Secondary | ICD-10-CM

## 2020-06-01 NOTE — Progress Notes (Signed)
GYNECOLOGY  VISIT  CC:   Review ultrasound findings  HPI: 60 y.o. G1P1 Significant Other White or Caucasian female here for additional evaluation of postmenopausal bleeding.  Endometrial biopsy was negative for abnormal cells.  Ultrasound performed today in radiology.  Ultrasound findings noted: Uterus measurements: 8.5 x 3.8 x 4.9 cm = volume: 84.6 mL. No fibroids or other mass visualized. Endometrium Thickness: 10.8 mm.  No focal abnormality visualized.  Reviewed with pt.  This is likely a polyp that is present given abnormal thickening and bleeding with negative biopsy.  Pt and I discussed removal with hysteroscopy.  Procedure discussed with patient.  Recovery and pain management discussed.  Risks discussed including but not limited to bleeding, rare risk of transfusion, infection, 1% risk of uterine perforation with risks of fluid deficit causing cardiac arrythmia, cerebral swelling and/or need to stop procedure early.  Fluid emboli and rare risk of death discussed.  DVT/PE, rare risk of risk of bowel/bladder/ureteral/vascular injury.  Patient aware if pathology abnormal she may need additional treatment.    As pt has upcoming lumpectomy, will need to review with Dr. Marlou Starks if this can be combined together.  If so, will wait for additional testing that is being done to determine full treatment course for new breast cancer diagnosis.  If not, will proceed with getting scheduled.  GYNECOLOGIC HISTORY: Patient's last menstrual period was 07/21/2015. Contraception: PMP Menopausal hormone therapy: none  Patient Active Problem List   Diagnosis Date Noted  . Family history of breast cancer 05/30/2020  . Family history of prostate cancer 05/30/2020  . Family history of skin cancer 05/30/2020  . Family history of pancreatic cancer 05/30/2020  . Family history of renal cancer 05/30/2020  . Malignant neoplasm of lower-outer quadrant of left breast of female, estrogen receptor positive ([place])  05/18/2020  . Hyperlipidemia 03/24/2020  . Mixed hyperlipidemia 03/24/2020  . 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 Snoring 03/24/2020  . Vitamin D deficiency 03/24/2020  . Lichen sclerosus et atrophicus 05/11/2019  . Sleep apnea   . Essential hypertension, benign 11/29/2015  . Morbid obesity ([place]) 08/21/2014    Past Medical History:  Diagnosis Date  . 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 Breast cancer ([place]) 05/2020  . Depression   . Diabetes ([place]) 2022  . Family history of breast cancer 05/30/2020  . Family history of pancreatic cancer 05/30/2020  . Family history of prostate cancer 05/30/2020  . Family history of renal cancer 05/30/2020  . Family history of skin cancer 05/30/2020  . Gallbladder problem   . Hypertension   . Knee pain   . Obesity   . Other fatigue   . Shortness of breath on exertion   . Sleep apnea    using c-pap machine  . Vitamin D deficiency     Past Surgical History:  Procedure Laterality Date  . laparoscopy cholectomy  2001    MEDS:   Current Outpatient Medications on File Prior to Visit  Medication Sig Dispense Refill  . buPROPion (WELLBUTRIN XL) 150 MG 24 hr tablet Take 1 tablet (150 mg total) by mouth daily. 30 tablet 2  . Cholecalciferol (VITAMIN D) 125 MCG (5000 UT) CAPS Take 1 capsule by mouth See admin instructions. Take 1 capsule by mouth three times per week.    . mometasone (ELOCON) 0.1 % ointment Apply topically as directed once or twice weekly 45 g 2  . polyethylene glycol (MIRALAX / GLYCOLAX)  17 g packet Take 17 g by mouth daily. (Patient not taking: Reported on 05/26/2020) 90 each 0  . valsartan-hydrochlorothiazide (DIOVAN-HCT) 160-12.5 MG tablet Take 1 tablet by mouth daily.    . Vitamin D, Ergocalciferol, (DRISDOL) 1.25 MG (50000 UNIT) CAPS capsule TAKE 1 CAPSULE BY MOUTH EVERY 7 DAYS 4 capsule 0   No current facility-administered medications on file prior to visit.     ALLERGIES: Bactrim [sulfamethoxazole-trimethoprim] and Penicillins  Family History  Problem Relation Age of Onset  . Hypertension Mother   . Hyperlipidemia Mother   . Heart disease Mother   . Obesity Mother   . Skin cancer Mother   . Hypertension Father   . Obesity Father   . Prostate cancer Father        dx 30s  . Renal cancer Father        dx 63s  . Cancer Father        mouth; dx after 75  . Skin cancer Father        dx after 55  . Cancer Paternal Grandmother        unknown type; ? pancreatic; dx after 85  . Pancreatic cancer Paternal Aunt        dx 75s  . Skin cancer Maternal Grandmother        face  . Melanoma Maternal Uncle        mets; dx 67s  . Cancer Paternal Uncle        unknown type; dx unknown age  . Breast cancer Cousin        dx 15s; paternal cousin    SH:  Single, non smoker  Review of Systems  Genitourinary: Positive for vaginal bleeding.    PHYSICAL EXAMINATION:    LMP 07/21/2015     Physical Exam Constitutional:      Appearance: Normal appearance.  Cardiovascular:     Rate and Rhythm: Normal rate and regular rhythm.     Heart sounds: Normal heart sounds.  Pulmonary:     Effort: Pulmonary effort is normal.     Breath sounds: Normal breath sounds.  Skin:    General: Skin is warm.  Neurological:     General: No focal deficit present.     Mental Status: She is alert.  Psychiatric:        Mood and Affect: Mood normal.    Assessment/Plan: 1. Postmenopausal bleeding - will proceed with scheduling hysteroscopy with probable polyp resection, dilation and curettage  2. Thickened endometrium  3. Malignant neoplasm of lower-outer quadrant of left breast of female, estrogen receptor positive ([place])

## 2020-06-02 ENCOUNTER — Encounter: Payer: Self-pay | Admitting: *Deleted

## 2020-06-06 ENCOUNTER — Telehealth: Payer: Self-pay | Admitting: Genetic Counselor

## 2020-06-06 ENCOUNTER — Encounter: Payer: Self-pay | Admitting: Genetic Counselor

## 2020-06-06 DIAGNOSIS — Z1379 Encounter for other screening for genetic and chromosomal anomalies: Secondary | ICD-10-CM | POA: Insufficient documentation

## 2020-06-06 NOTE — Telephone Encounter (Signed)
Revealed negative genetic testing.  Discussed that we do not know why she has breast cancer or why there is cancer in the family. It could be familial, due to a different gene that we are not testing, or maybe our current technology may not be able to pick something up.  It will be important for her to keep in contact with genetics to keep up with whether additional testing may be needed.  Results of pan-cancer panel are pending.

## 2020-06-07 ENCOUNTER — Other Ambulatory Visit: Payer: Self-pay | Admitting: General Surgery

## 2020-06-07 ENCOUNTER — Encounter (INDEPENDENT_AMBULATORY_CARE_PROVIDER_SITE_OTHER): Payer: Self-pay | Admitting: Bariatrics

## 2020-06-07 ENCOUNTER — Encounter (HOSPITAL_BASED_OUTPATIENT_CLINIC_OR_DEPARTMENT_OTHER): Payer: Self-pay

## 2020-06-07 ENCOUNTER — Other Ambulatory Visit: Payer: Self-pay

## 2020-06-07 ENCOUNTER — Ambulatory Visit (INDEPENDENT_AMBULATORY_CARE_PROVIDER_SITE_OTHER): Payer: BC Managed Care – PPO | Admitting: Bariatrics

## 2020-06-07 ENCOUNTER — Telehealth (INDEPENDENT_AMBULATORY_CARE_PROVIDER_SITE_OTHER): Payer: BC Managed Care – PPO | Admitting: Psychology

## 2020-06-07 ENCOUNTER — Encounter: Payer: Self-pay | Admitting: *Deleted

## 2020-06-07 ENCOUNTER — Ambulatory Visit: Payer: Self-pay | Admitting: General Surgery

## 2020-06-07 VITALS — BP 148/86 | HR 68 | Temp 98.3°F | Ht 69.0 in | Wt 340.0 lb

## 2020-06-07 DIAGNOSIS — I10 Essential (primary) hypertension: Secondary | ICD-10-CM | POA: Diagnosis not present

## 2020-06-07 DIAGNOSIS — F5089 Other specified eating disorder: Secondary | ICD-10-CM

## 2020-06-07 DIAGNOSIS — F3289 Other specified depressive episodes: Secondary | ICD-10-CM

## 2020-06-07 DIAGNOSIS — E559 Vitamin D deficiency, unspecified: Secondary | ICD-10-CM

## 2020-06-07 DIAGNOSIS — Z6841 Body Mass Index (BMI) 40.0 and over, adult: Secondary | ICD-10-CM

## 2020-06-07 DIAGNOSIS — C50512 Malignant neoplasm of lower-outer quadrant of left female breast: Secondary | ICD-10-CM

## 2020-06-08 ENCOUNTER — Other Ambulatory Visit (INDEPENDENT_AMBULATORY_CARE_PROVIDER_SITE_OTHER): Payer: Self-pay | Admitting: Family Medicine

## 2020-06-08 ENCOUNTER — Ambulatory Visit: Payer: BC Managed Care – PPO | Attending: General Surgery

## 2020-06-08 DIAGNOSIS — C50912 Malignant neoplasm of unspecified site of left female breast: Secondary | ICD-10-CM | POA: Diagnosis present

## 2020-06-08 DIAGNOSIS — Z170 Estrogen receptor positive status [ER+]: Secondary | ICD-10-CM | POA: Insufficient documentation

## 2020-06-08 DIAGNOSIS — E559 Vitamin D deficiency, unspecified: Secondary | ICD-10-CM

## 2020-06-08 DIAGNOSIS — R293 Abnormal posture: Secondary | ICD-10-CM

## 2020-06-08 NOTE — Therapy (Signed)
[place], [place], [address] Phone: [phone]   Fax:  [phone]  Physical Therapy Evaluation  Patient Details  Name: Anna Martinez MRN: [id number] Date of Birth: 1960-05-12 Referring Provider (PT): Dr. Marlou Starks   Encounter Date: 06/08/2020   PT End of Session - 06/08/20 1002    Visit Number 1    Number of Visits 2    Date for PT Re-Evaluation 08/03/20    PT Start Time 0903    PT Stop Time 0955    PT Time Calculation (min) 52 min    Activity Tolerance Patient tolerated treatment well    Behavior During Therapy [facility] for tasks assessed/performed           Past Medical History:  Diagnosis Date  . 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 Breast cancer ([place]) 05/2020  . Depression   . Diabetes ([place]) 2022  . Family history of breast cancer 05/30/2020  . Family history of pancreatic cancer 05/30/2020  . Family history of prostate cancer 05/30/2020  . Family history of renal cancer 05/30/2020  . Family history of skin cancer 05/30/2020  . Gallbladder problem   . Hypertension   . Knee pain   . Obesity   . Other fatigue   . Shortness of breath on exertion   . Sleep apnea    using c-pap machine  . Vitamin D deficiency     Past Surgical History:  Procedure Laterality Date  . laparoscopy cholectomy  2001    There were no vitals filed for this visit.    Subjective Assessment - 06/08/20 0908    Subjective Pt here for preo-op baselines    Pertinent History Pt was diagnosed in March with a routine mammogram.  She is scheduled for surgery on 06/29/20 for lumpectomy with SLNB for Invasive Lobular Carcinoma.  She anticipates radiation but no chemotherapy    Patient Stated Goals Pre-surgical screen    Currently in Pain? No/denies    Pain Score 0-No pain              [facility] PT Assessment - 06/08/20 0001      Assessment   Medical Diagnosis Left breast CA     Referring Provider (PT) Dr. Marlou Starks    Onset Date/Surgical Date 06/29/20    Hand Dominance Right    Prior Therapy no      Precautions   Precaution Comments will be lymphedema risk      Restrictions   Weight Bearing Restrictions No      Balance Screen   Has the patient fallen in the past 6 months No    Has the patient had a decrease in activity level because of a fear of falling?  No    Is the patient reluctant to leave their home because of a fear of falling?  No      Home Ecologist residence    Living Arrangements Other (Comment)   friend   Available Help at Discharge Other (Comment)      Prior Function   Level of Independence Independent    Vocation Retired    Leisure pottery, walking, swimming      Posture/Postural Control   Posture/Postural Control Postural limitations    Postural Limitations Rounded Shoulders;Forward head      AROM   Right Shoulder Extension 56 Degrees    Right Shoulder Flexion 168 Degrees  Right Shoulder ABduction 175 Degrees    Right Shoulder Internal Rotation 63 Degrees    Right Shoulder External Rotation 100 Degrees    Left Shoulder Extension 50 Degrees    Left Shoulder Flexion 166 Degrees   achiness since biopsy   Left Shoulder ABduction 175 Degrees   achiness since biopsy   Left Shoulder Internal Rotation 60 Degrees    Left Shoulder External Rotation 105 Degrees             LYMPHEDEMA/ONCOLOGY QUESTIONNAIRE - 06/08/20 0001      Type   Cancer Type Invasive Lobular Carcinoma in Situ      Surgeries   Lumpectomy Date 06/29/20    Sentinel Lymph Node Biopsy Date 06/29/20      Treatment   Active Chemotherapy Treatment No    Past Chemotherapy Treatment No    Active Radiation Treatment --   will have   Past Radiation Treatment No    Current Hormone Treatment No    Past Hormone Therapy No      Right Upper Extremity Lymphedema   10 cm Proximal to Olecranon Process 40.7 cm    Olecranon Process 32.5 cm     10 cm Proximal to Ulnar Styloid Process 27.3 cm    Just Proximal to Ulnar Styloid Process 20.3 cm    At Base of 2nd Digit 6.5 cm      Left Upper Extremity Lymphedema   10 cm Proximal to Olecranon Process 43.5 cm    Olecranon Process 23.5 cm    10 cm Proximal to Ulnar Styloid Process 29 cm    Just Proximal to Ulnar Styloid Process 20.7 cm    At Base of 2nd Digit 6.4 cm           L-DEX FLOWSHEETS - 06/08/20 0900      L-DEX LYMPHEDEMA SCREENING   Measurement Type Unilateral    L-DEX MEASUREMENT EXTREMITY Upper Extremity    POSITION  Standing    DOMINANT SIDE Right    At Risk Side Left    BASELINE SCORE (UNILATERAL) 3.4                Quick Dash - 06/08/20 0001    Open a tight or new jar No difficulty    Do heavy household chores (wash walls, wash floors) No difficulty    Carry a shopping bag or briefcase No difficulty    Wash your back No difficulty    Use a knife to cut food No difficulty    Recreational activities in which you take some force or impact through your arm, shoulder, or hand (golf, hammering, tennis) No difficulty    During the past week, to what extent has your arm, shoulder or hand problem interfered with your normal social activities with family, friends, neighbors, or groups? Not at all    During the past week, to what extent has your arm, shoulder or hand problem limited your work or other regular daily activities Not at all    Arm, shoulder, or hand pain. Mild    Tingling (pins and needles) in your arm, shoulder, or hand None    Difficulty Sleeping No difficulty    DASH Score 2.27 %            Objective measurements completed on examination: See above findings.               PT Education - 06/08/20 1001    Education Details Pt was educated in 4 post  op exercises, lymphedema and skin care, ABC class, and every 3 month SOZO screens    Person(s) Educated Patient    Methods Explanation;Handout    Comprehension Verbalized  understanding;Returned demonstration                Breast Clinic Goals - 06/08/20 1216      Patient will be able to verbalize understanding of pertinent lymphedema risk reduction practices relevant to her diagnosis specifically related to skin care.   Time 1    Period Days    Status New    Target Date 08/03/20      Patient will be able to return demonstrate and/or verbalize understanding of the post-op home exercise program related to regaining shoulder range of motion.   Time 1    Period Days    Status New    Target Date 08/03/20      Patient will be able to verbalize understanding of the importance of attending the postoperative After Breast Cancer Class for further lymphedema risk reduction education and therapeutic exercise.   Time 1    Period Days    Status New    Target Date 08/03/20                 Plan - 06/08/20 1003    Clinical Impression Statement Pt was seen for Pre-op screen.  She was educated in 4 post op exercises to begin the week after surgery and we discussed but did not schedule the ABC class. She was scheduled for her 3 month SOZO screen.  We also discussed skin care and lymphedema,    Personal Factors and Comorbidities Comorbidity 2    Comorbidities breast Cancer, obesity    Stability/Clinical Decision Making Stable/Uncomplicated    Clinical Decision Making Low    Rehab Potential Excellent    PT Frequency 1x / week    PT Duration 8 weeks    PT Treatment/Interventions Therapeutic exercise;Patient/family education    PT Next Visit Plan reassess post surgical, schedule ABC class    PT Home Exercise Plan 4 post op exercises    Consulted and Agree with Plan of Care Patient          Patient will follow up at outpatient cancer rehab 3-4 weeks following surgery.  If the patient requires physical therapy at that time, a specific plan will be dictated and sent to the referring physician for approval. The patient was educated today on appropriate  basic range of motion exercises to begin post operatively and the importance of attending the After Breast Cancer class following surgery.  Patient was educated today on lymphedema risk reduction practices as it pertains to recommendations that will benefit the patient immediately following surgery.  She verbalized good understanding.   The patient was assessed using the L-Dex machine today to produce a lymphedema index baseline score. The patient will be reassessed on a regular basis (typically every 3 months) to obtain new L-Dex scores. If the score is > 6.5 points away from his/her baseline score indicating onset of subclinical lymphedema, it will be recommended to wear a compression garment for 4 weeks, 12 hours per day and then be reassessed. If the score continues to be > 6.5 points from baseline at reassessment, we will initiate lymphedema treatment. Assessing in this manner has a 95% rate of preventing clinically significant lymphedema.  Patient will benefit from skilled therapeutic intervention in order to improve the following deficits and impairments:  Postural dysfunction,Decreased knowledge of precautions  Visit  Diagnosis: Abnormal posture  Malignant neoplasm of left breast in female, estrogen receptor positive, unspecified site of breast [facility])     Problem List Patient Active Problem List   Diagnosis Date Noted  . Genetic testing 06/06/2020  . Family history of breast cancer 05/30/2020  . Family history of prostate cancer 05/30/2020  . Family history of skin cancer 05/30/2020  . Family history of pancreatic cancer 05/30/2020  . Family history of renal cancer 05/30/2020  . Malignant neoplasm of lower-outer quadrant of left breast of female, estrogen receptor positive ([place]) 05/18/2020  . Hyperlipidemia 03/24/2020  . Mixed hyperlipidemia 03/24/2020  . Moderate recurrent major depression ([place]) 03/24/2020  . Snoring 03/24/2020  . Vitamin D deficiency 03/24/2020  . Lichen sclerosus  et atrophicus 05/11/2019  . Sleep apnea   . Essential hypertension, benign 11/29/2015  . Morbid obesity ([place]) 08/21/2014    Claris Pong 06/08/2020, 12:19 PM  [place], [place], [address] Phone: [phone]   Fax:  [phone]  Name: RACQUELLE HYSER MRN: [id number] Date of Birth: [birth year] Cheral Almas, PT 06/08/20 12:23 PM

## 2020-06-08 NOTE — Telephone Encounter (Signed)
Dr.Brown 

## 2020-06-08 NOTE — Progress Notes (Signed)
Chief Complaint:   OBESITY Anna Martinez is here to discuss her progress with her obesity treatment plan along with follow-up of her obesity related diagnoses. Anna Martinez is on the Category 4 Plan and states she is following her eating plan approximately 50-60% of the time. Anna Martinez states she is not currently exercising.  Today's visit was #: 4 Starting weight: 353 lbs Starting date: 04/07/2020 Today's weight: 340 lbs Today's date: 06/07/2020 Total lbs lost to date: 13 Total lbs lost since last in-office visit: 0  Interim History: Anna Martinez's weight remains the same. She was recently diagnosed with breast cancer and is scheduled for breast surgery (lumpectomy).  Subjective:   1. Vitamin D deficiency Anna Martinez's Vitamin D level was 34.2 on 04/07/2020. She is currently taking prescription vitamin D 50,000 IU each week. She denies nausea, vomiting or muscle weakness.  2. Essential hypertension, benign Anna Martinez is taking Diovan/HCT.  BP Readings from Last 3 Encounters:  06/07/20 (!) 148/86  06/01/20 (!) 178/90  05/30/20 (!) 161/85    3. Other disorder of eating Anna Martinez has a prescription for Wellbutrin, per Dr. Sabra Heck and talking to Dr. Mallie Mussel.  Assessment/Plan:   1. Vitamin D deficiency Low Vitamin D level contributes to fatigue and are associated with obesity, breast, and colon cancer. She agrees to continue to take prescription Vitamin D @50 ,000 IU every week and will follow-up for routine testing of Vitamin D, at least 2-3 times per year to avoid over-replacement.  2. Essential hypertension, benign Anna Martinez is working on healthy weight loss and exercise to improve blood pressure control. We will watch for signs of hypotension as she continues her lifestyle modifications. Continue current anti-hypertensive regimen.  3. Other disorder of eating Will begin Wellbutrin. Continue to talk with Dr. Mallie Mussel.  4. Obesity with current BMI of 50.3  Anna Martinez is currently in the action stage of change. As such, her goal is to  continue with weight loss efforts. She has agreed to the Category 4 Plan.   Meal plan Intentional eating  Exercise goals: Pt is to consider increasing exercise, walking, and small weights.  Behavioral modification strategies: increasing lean protein intake, decreasing simple carbohydrates, increasing vegetables, increasing water intake, decreasing eating out, no skipping meals, meal planning and cooking strategies, keeping healthy foods in the home and planning for success.  Anna Martinez has agreed to follow-up with our clinic in 3 weeks. She was informed of the importance of frequent follow-up visits to maximize her success with intensive lifestyle modifications for her multiple health conditions.   Objective:   Blood pressure (!) 148/86, pulse 68, temperature 98.3 F (36.8 C), height 5\' 9"  (1.753 m), weight (!) 340 lb (154.2 kg), last menstrual period 07/21/2015, SpO2 97 %. Body mass index is 50.21 kg/m.  General: Cooperative, alert, well developed, in no acute distress. HEENT: Conjunctivae and lids unremarkable. Cardiovascular: Regular rhythm.  Lungs: Normal work of breathing. Neurologic: No focal deficits.   Lab Results  Component Value Date   CREATININE 0.84 04/07/2020   BUN 14 04/07/2020   NA 141 04/07/2020   K 4.0 04/07/2020   CL 101 04/07/2020   CO2 22 04/07/2020   Lab Results  Component Value Date   ALT 20 04/07/2020   AST 19 04/07/2020   ALKPHOS 143 (H) 04/07/2020   BILITOT 0.6 04/07/2020   Lab Results  Component Value Date   HGBA1C 6.6 (H) 04/07/2020   Lab Results  Component Value Date   INSULIN 17.0 04/07/2020   Lab Results  Component Value Date  TSH 3.970 04/07/2020   Lab Results  Component Value Date   CHOL 202 (H) 04/07/2020   HDL 44 04/07/2020   LDLCALC 141 (H) 04/07/2020   TRIG 96 04/07/2020   Lab Results  Component Value Date   WBC 9.0 04/07/2020   HGB 14.5 04/07/2020   HCT 44.6 04/07/2020   MCV 86 04/07/2020   PLT 316 04/07/2020   No  results found for: IRON, TIBC, FERRITIN   Attestation Statements:   Reviewed by clinician on day of visit: allergies, medications, problem list, medical history, surgical history, family history, social history, and previous encounter notes.  Time spent on visit including pre-visit chart review and post-visit care and charting was 20 minutes.   Coral Ceo, am acting as Location manager for [employer], DO.  I have reviewed the above documentation for accuracy and completeness, and I agree with the above. Jearld Lesch, DO

## 2020-06-08 NOTE — Patient Instructions (Signed)

## 2020-06-09 NOTE — Telephone Encounter (Signed)
Have you sent this surgery request for this appointment? Please advise.

## 2020-06-10 NOTE — Telephone Encounter (Signed)
Patient has called again this morning wanting to know if BOTH surgeries are going to be done on the same day. Please advise.  Per Dr. Sabra Heck, she is waiting to hear back from Dr. Marlou Starks. If she does not hear from him by next week she will proceed with scheduling surgery. Patient has been made aware of these recommendations and will wait to hear from our office. tbw

## 2020-06-13 ENCOUNTER — Encounter (INDEPENDENT_AMBULATORY_CARE_PROVIDER_SITE_OTHER): Payer: Self-pay | Admitting: Bariatrics

## 2020-06-13 NOTE — Progress Notes (Signed)
  Office: [phone]  /  Fax: [phone]    Date: Jun 27, 2020   Appointment Start Time: 8:00am Duration: 24 minutes Provider: Glennie Isle, Psy.D. Type of Session: Individual Therapy  Location of Patient: Parked in car (safe location) Location of Provider: Provider's Home (private office) Type of Contact: Telepsychological Visit via MyChart Video Visit  Session Content: Anna Martinez is a 60 y.o. female presenting for a follow-up appointment to address the previously established treatment goal of increasing coping skills. Today's appointment was a telepsychological visit due to COVID-19. Anna Martinez provided verbal consent for today's telepsychological appointment and she is aware she is responsible for securing confidentiality on her end of the session. Prior to proceeding with today's appointment, Anna Martinez's physical location at the time of this appointment was obtained as well a phone number she could be reached at in the event of technical difficulties. Anna Martinez and this provider participated in today's telepsychological service.   This provider conducted a brief check-in. Anna Martinez stated she initiated therapeutic services with [facility] to address ongoing stress and grief. She shared about upcoming medical procedures related to breast cancer. Notably, she shared, "I feel like I've been pretty good [as it relates to her eating habits]." Anna Martinez acknowledged some deviations as she found the meal plan to be "restrictive." Nonetheless, she reported a reduction in emotional eating, adding she is making better choices and engaging in portion control. She discussed a plan to increase protein intake. She was engaged in problem solving to help achieve the aforementioned (e.g., doubling recipes). Moreover, Anna Martinez stated she "miss[es] sugar really bad." She discussed craving specific sweets. Strategies were discussed to assist with coping. Anna Martinez was receptive. Overall, Anna Martinez was receptive to today's appointment  as evidenced by openness to sharing, responsiveness to feedback, and willingness to implement discussed strategies .  Mental Status Examination:  Appearance: well groomed and appropriate hygiene  Behavior: appropriate to circumstances Mood: euthymic Affect: mood congruent Speech: normal in rate, volume, and tone Eye Contact: appropriate Psychomotor Activity: unable to assess  Gait: unable to assess Thought Process: linear, logical, and goal directed  Thought Content/Perception: no hallucinations, delusions, bizarre thinking or behavior reported or observed and no evidence or endorsement of suicidal and homicidal ideation, plan, and intent Orientation: time, person, place, and purpose of appointment Memory/Concentration: memory, attention, language, and fund of knowledge intact  Insight/Judgment: good  Interventions:  Conducted a brief chart review Provided empathic reflections and validation Employed supportive psychotherapy interventions to facilitate reduced distress and to improve coping skills with identified stressors Employed insight oriented and cognitive psychotherapy interventions to identify and modify anxiety/mood producing thoughts, beliefs, and negative self-appraisals contributing to distress and emotional eating Engaged patient in problem solving  DSM-5 Diagnosis(es): F32.89 Other Specified Depressive Disorder, Emotional Eating Behaviors  Treatment Goal & Progress: During the initial appointment with this provider, the following treatment goal was established: increase coping skills. Anna Martinez has demonstrated progress in her goal as evidenced by increased awareness of hunger patterns, increased awareness of triggers for emotional eating and reduction in emotional eating. Anna Martinez also continues to demonstrate willingness to engage in learned skill(s).  Plan: Anna Martinez declined future appointments with this provider, as she established care with a new provider. Her next appointment with  her primary therapist is on July 20, 2020. She acknowledged understanding that she may request a follow-up appointment with this provider in the future as long as she is still established with the clinic. No further follow-up planned by this provider.

## 2020-06-16 ENCOUNTER — Encounter (HOSPITAL_BASED_OUTPATIENT_CLINIC_OR_DEPARTMENT_OTHER): Payer: Self-pay | Admitting: *Deleted

## 2020-06-16 ENCOUNTER — Telehealth: Payer: Self-pay | Admitting: *Deleted

## 2020-06-16 NOTE — Telephone Encounter (Signed)
Call to patient. Voice mail has number confirmation. Left message that our procedure has been added to currently scheduled one. Request call back to [phone] to review.

## 2020-06-16 NOTE — Telephone Encounter (Signed)
Call from patient. Advised we were able to review surgery plan with Dr Marlou Starks and add this case to already scheduled lumpectomy on 06-29-20. Advised will receive call from hospital regarding pre-surgical testing. Letter and hospital information will be sent by mail.  Encounter closed.

## 2020-06-17 ENCOUNTER — Other Ambulatory Visit (HOSPITAL_BASED_OUTPATIENT_CLINIC_OR_DEPARTMENT_OTHER): Payer: Self-pay | Admitting: Obstetrics & Gynecology

## 2020-06-17 ENCOUNTER — Telehealth: Payer: Self-pay | Admitting: Genetic Counselor

## 2020-06-17 NOTE — Telephone Encounter (Signed)
Revealed negative genetic testing of CancerNext-Expanded +RNAinsight Panel.  Discussed that we do not know why she has breast cancer or why there is cancer in the family. It could be sporadic/familial, due to a different gene that we are not testing, or maybe our current technology may not be able to pick something up.  It will be important for her to keep in contact with genetics to keep up with whether additional testing may be needed.

## 2020-06-20 ENCOUNTER — Encounter: Payer: Self-pay | Admitting: *Deleted

## 2020-06-20 ENCOUNTER — Ambulatory Visit: Payer: Self-pay | Admitting: Genetic Counselor

## 2020-06-20 DIAGNOSIS — Z803 Family history of malignant neoplasm of breast: Secondary | ICD-10-CM

## 2020-06-20 DIAGNOSIS — C50512 Malignant neoplasm of lower-outer quadrant of left female breast: Secondary | ICD-10-CM

## 2020-06-20 DIAGNOSIS — Z8042 Family history of malignant neoplasm of prostate: Secondary | ICD-10-CM

## 2020-06-20 DIAGNOSIS — Z808 Family history of malignant neoplasm of other organs or systems: Secondary | ICD-10-CM

## 2020-06-20 DIAGNOSIS — Z800 Family history of malignant neoplasm of digestive organs: Secondary | ICD-10-CM

## 2020-06-20 DIAGNOSIS — Z1379 Encounter for other screening for genetic and chromosomal anomalies: Secondary | ICD-10-CM

## 2020-06-20 DIAGNOSIS — Z8051 Family history of malignant neoplasm of kidney: Secondary | ICD-10-CM

## 2020-06-20 NOTE — Progress Notes (Signed)
HPI:  Ms. Reach was previously seen in the [facility] due to a personal and family history of cancer and concerns regarding a hereditary predisposition to cancer. Please refer to our prior cancer genetics clinic note for more information regarding our discussion, assessment and recommendations, at the time. Ms. Jemison recent genetic test results were disclosed to her, as were recommendations warranted by these results. These results and recommendations are discussed in more detail below.  CANCER HISTORY:  Oncology History  Malignant neoplasm of lower-outer quadrant of left breast of female, estrogen receptor positive ([place])  05/09/2020 Breast US   Targeted ultrasound is performed, showing a vague heterogeneous though predominantly hypoechoic mass at the 5 o'clock position approximately 3 cm from nipple, associated with architectural distortion, measuring approximately 1.9 x 1.2 x 1.3 cm, demonstrating posterior acoustic shadowing and demonstrating peripheral power Doppler flow, corresponding to the screening mammographic.   Sonographic evaluation of the LEFT axilla demonstrates no pathologic lymphadenopathy.   IMPRESSION: 1. Suspicious 1.9 cm mass with associated architectural distortion involving the LOWER OUTER QUADRANT of the LEFT breast at the 5 o'clock position approximately 3 cm from nipple. 2. No pathologic LEFT axillary lymphadenopathy.   05/10/2020 Cancer Staging   Staging form: Breast, AJCC 8th Edition - Clinical stage from 05/10/2020: Stage IA (cT1c, cN0, cM0, G2, ER+, PR+, HER2-) - Signed by Gardenia Phlegm, NP on 05/18/2020 Histologic grading system: 3 grade system   05/10/2020 Initial Biopsy   Diagnosis Breast, left, needle core biopsy, 5 o'clock - INVASIVE MAMMARY CARCINOMA - MAMMARY CARCINOMA IN-SITU - SEE COMMENT Microscopic Comment The biopsy material shows an infiltrative proliferation of cells arranged linearly and in small clusters.  Based on the biopsy, the carcinoma appears Nottingham grade 2 of 3 and measures 1.5 cm in greatest linear extent. The tumor cells are NEGATIVE for Her2 (1+).  Estrogen Receptor: 80%, POSITIVE, STRONG-MODERATE STAINING INTENSITY Progesterone Receptor: 90%, POSITIVE, STRONG STAINING INTENSITY Proliferation Marker Ki67: 10% E-cadherin is NEGATIVE supporting lobular origin.   05/18/2020 Initial Diagnosis   Malignant neoplasm of lower-outer quadrant of left breast of female, estrogen receptor positive ([place])   06/06/2020 Genetic Testing   No pathogenic variants detected in [facility] CancerNext-Expanded +RNAinsight Panel.  The report date is Jun 13, 2020.  The CancerNext-Expanded gene panel offered by [facility] and includes sequencing, rearrangement, and RNA analysis for the following 77 genes: AIP, ALK, APC, ATM, AXIN2, BAP1, BARD1, BLM, BMPR1A, BRCA1, BRCA2, BRIP1, CDC73, CDH1, CDK4, CDKN1B, CDKN2A, CHEK2, CTNNA1, DICER1, FANCC, FH, FLCN, GALNT12, KIF1B, LZTR1, MAX, MEN1, MET, MLH1, MSH2, MSH3, MSH6, MUTYH, NBN, NF1, NF2, NTHL1, PALB2, PHOX2B, PMS2, POT1, PRKAR1A, PTCH1, PTEN, RAD51C, RAD51D, RB1, RECQL, RET, SDHA, SDHAF2, SDHB, SDHC, SDHD, SMAD4, SMARCA4, SMARCB1, SMARCE1, STK11, SUFU, TMEM127, TP53, TSC1, TSC2, VHL and XRCC2 (sequencing and deletion/duplication); EGFR, EGLN1, HOXB13, KIT, MITF, PDGFRA, POLD1, and POLE (sequencing only); EPCAM and GREM1 (deletion/duplication only).      FAMILY HISTORY:  We obtained a detailed, 4-generation family history.  Significant diagnoses are listed below: Family History  Problem Relation Age of Onset  . Prostate cancer Father        dx 65s  . Renal cancer Father        dx 30s  . Cancer Father        mouth; dx after 6  . Skin cancer Father        dx after 9  . Cancer Paternal Grandmother        unknown type; ? pancreatic; dx  after 50  . Pancreatic cancer Paternal Aunt        dx 27s  . Skin cancer Maternal Grandmother        face  . Melanoma  Maternal Uncle        mets; dx 37s  . Cancer Paternal Uncle        unknown type; dx unknown age  . Breast cancer Cousin        dx 11s; paternal cousin    Ms. Colledge is unaware of previous family history of genetic testing for hereditary cancer risks. Patient's maternal ancestors are of [place] descent, and paternal ancestors are of [place] descent. There is no reported Ashkenazi Jewish ancestry. There is no known consanguinity.  GENETIC TEST RESULTS: Genetic testing reported out on Jun 13, 2020.  The CancerNext-Expanded +RNAinsight Panel found no pathogenic mutations. The CancerNext-Expanded gene panel offered by [facility] and includes sequencing, rearrangement, and RNA analysis for the following 77 genes: AIP, ALK, APC, ATM, AXIN2, BAP1, BARD1, BLM, BMPR1A, BRCA1, BRCA2, BRIP1, CDC73, CDH1, CDK4, CDKN1B, CDKN2A, CHEK2, CTNNA1, DICER1, FANCC, FH, FLCN, GALNT12, KIF1B, LZTR1, MAX, MEN1, MET, MLH1, MSH2, MSH3, MSH6, MUTYH, NBN, NF1, NF2, NTHL1, PALB2, PHOX2B, PMS2, POT1, PRKAR1A, PTCH1, PTEN, RAD51C, RAD51D, RB1, RECQL, RET, SDHA, SDHAF2, SDHB, SDHC, SDHD, SMAD4, SMARCA4, SMARCB1, SMARCE1, STK11, SUFU, TMEM127, TP53, TSC1, TSC2, VHL and XRCC2 (sequencing and deletion/duplication); EGFR, EGLN1, HOXB13, KIT, MITF, PDGFRA, POLD1, and POLE (sequencing only); EPCAM and GREM1 (deletion/duplication only).   The test report has been scanned into EPIC and is located under the Molecular Pathology section of the Results Review tab.  A portion of the result report is included below for reference.     We discussed with Ms. Claycomb that because current genetic testing is not perfect, it is possible there may be a gene mutation in one of these genes that current testing cannot detect, but that chance is small.  We also discussed, that there could be another gene that has not yet been discovered, or that we have not yet tested, that is responsible for the cancer diagnoses in the family. It is also possible there is a  hereditary cause for the cancer in the family that Ms. Farney did not inherit and therefore was not identified in her testing.  Therefore, it is important to remain in touch with cancer genetics in the future so that we can continue to offer Ms. Rowles the most up to date genetic testing.   ADDITIONAL GENETIC TESTING: We discussed with Ms. Dor that her genetic testing was fairly extensive.  If there are genes identified to increase cancer risk that can be analyzed in the future, we would be happy to discuss and coordinate this testing at that time.    CANCER SCREENING RECOMMENDATIONS: Ms. Bares test result is considered negative (normal).  This means that we have not identified a hereditary cause for her personal history of cancer at this time. Most cancers happen by chance and this negative test suggests that her cancer may fall into this category.    While reassuring, this does not definitively rule out a hereditary predisposition to cancer. It is still possible that there could be genetic mutations that are undetectable by current technology. There could be genetic mutations in genes that have not been tested or identified to increase cancer risk.  Therefore, it is recommended she continue to follow the cancer management and screening guidelines provided by her oncology and primary healthcare provider.   An individual's cancer risk  and medical management are not determined by genetic test results alone. Overall cancer risk assessment incorporates additional factors, including personal medical history, family history, and any available genetic information that may result in a personalized plan for cancer prevention and surveillance  RECOMMENDATIONS FOR FAMILY MEMBERS:  Individuals in this family might be at some increased risk of developing cancer, over the general population risk, simply due to the family history of cancer.  We recommended women in this family have a yearly mammogram beginning at  age 52, or 66 years younger than the earliest onset of cancer, an annual clinical breast exam, and perform monthly breast self-exams. Women in this family should also have a gynecological exam as recommended by their primary provider. Family members should be referred for colonoscopy starting at age 51.  It is also possible there is a hereditary cause for the cancer in Ms. Mussell family that she did not inherit and therefore was not identified in her.  Based on Ms. Howald family history, we recommended her paternal cousin, who was diagnosed with breast cancer in her 30s, have genetic counseling and testing. Ms. Schweizer will let us know if we can be of any assistance in coordinating genetic counseling and/or testing for this family member.   FOLLOW-UP: Lastly, we discussed with Ms. Sloma that cancer genetics is a rapidly advancing field and it is possible that new genetic tests will be appropriate for her and/or her family members in the future. We encouraged her to remain in contact with cancer genetics on an annual basis so we can update her personal and family histories and let her know of advances in cancer genetics that may benefit this family.   Our contact number was provided. Ms. Kercheval questions were answered to her satisfaction, and she knows she is welcome to call us at anytime with additional questions or concerns.     Homer Miller M. Joette Catching, [place], [facility] Genetic Counselor [email or web address] [phone]

## 2020-06-21 ENCOUNTER — Ambulatory Visit (INDEPENDENT_AMBULATORY_CARE_PROVIDER_SITE_OTHER): Payer: BC Managed Care – PPO | Admitting: Adult Health

## 2020-06-21 ENCOUNTER — Encounter (INDEPENDENT_AMBULATORY_CARE_PROVIDER_SITE_OTHER): Payer: Self-pay | Admitting: Adult Health

## 2020-06-21 ENCOUNTER — Other Ambulatory Visit: Payer: Self-pay

## 2020-06-21 VITALS — BP 154/82 | HR 65 | Temp 98.0°F | Ht 69.0 in | Wt 336.0 lb

## 2020-06-21 DIAGNOSIS — E1169 Type 2 diabetes mellitus with other specified complication: Secondary | ICD-10-CM | POA: Diagnosis not present

## 2020-06-21 DIAGNOSIS — E559 Vitamin D deficiency, unspecified: Secondary | ICD-10-CM

## 2020-06-21 DIAGNOSIS — Z9189 Other specified personal risk factors, not elsewhere classified: Secondary | ICD-10-CM

## 2020-06-21 DIAGNOSIS — Z6841 Body Mass Index (BMI) 40.0 and over, adult: Secondary | ICD-10-CM | POA: Diagnosis not present

## 2020-06-21 MED ORDER — VITAMIN D (ERGOCALCIFEROL) 1.25 MG (50000 UNIT) PO CAPS: 1.0000 | ORAL_CAPSULE | ORAL | 0 refills | Status: DC

## 2020-06-22 DIAGNOSIS — E119 Type 2 diabetes mellitus without complications: Secondary | ICD-10-CM | POA: Insufficient documentation

## 2020-06-22 NOTE — Progress Notes (Signed)
Chief Complaint:   OBESITY Anna Martinez is here to discuss her progress with her obesity treatment plan along with follow-up of her obesity related diagnoses. Anna Martinez is on the Category 4 Plan and states she is following her eating plan approximately 85% of the time. Anna Martinez states she is walking 20 minutes 2 times per week.  Today's visit was #: 5 Starting weight: 353 lbs Starting date: 04/07/2020 Today's weight: 336 lbs Today's date: 06/21/2020 Total lbs lost to date: 17 Total lbs lost since last in-office visit: 4  Interim History: Recently dx'd with malignant neoplasm of lower-outer L breast, estrogen receptive + Anna Martinez is scheduled for radioactive seed and lumpectomy next week- nervous at both procedures.  She has a large, strong support system to assist her during recovery and cancer treatment.  Subjective:   1. Vitamin D deficiency Anna Martinez's Vitamin D level was 34.2 on 04/07/2020, which is below goal of 50. She is currently taking prescription vitamin D 50,000 IU each week. She denies nausea, vomiting or muscle weakness. -She is also on OTC Vit D3 5,000 IU QD.  2. Type 2 diabetes mellitus with other specified complication, without long-term current use of insulin (HCC) 04/07/3020 A1c 6.6, BG 116, and insulin level 17.0- all elevated. Anna Martinez is using lifestyle modifications to normalize levels.  Lab Results  Component Value Date   HGBA1C 6.6 (H) 04/07/2020   Lab Results  Component Value Date   LDLCALC 141 (H) 04/07/2020   CREATININE 0.84 04/07/2020   Lab Results  Component Value Date   INSULIN 17.0 04/07/2020   3. At risk for hyperglycemia Anna Martinez is at risk for hyperglycemia due to type 2 diabetes and not being on any BG lowering agents.  Assessment/Plan:   1. Vitamin D deficiency Low Vitamin D level contributes to fatigue and are associated with obesity, breast, and colon cancer. She agrees to continue to take prescription Vitamin D @50 ,000 IU every week and OTC Vit D3 5K IU QD will  follow-up for routine testing of Vitamin D, at least 2-3 times per year to avoid over-replacement. - Vitamin D, Ergocalciferol, (DRISDOL) 1.25 MG (50000 UNIT) CAPS capsule; Take 1 capsule (50,000 Units total) by mouth every 7 (seven) days.  Dispense: 4 capsule; Refill: 0  2. Type 2 diabetes mellitus with other specified complication, without long-term current use of insulin (HCC) Good blood sugar control is important to decrease the likelihood of diabetic complications such as nephropathy, neuropathy, limb loss, blindness, coronary artery disease, and death. Intensive lifestyle modification including diet, exercise and weight loss are the first line of treatment for diabetes.  -Check labs in 3 months.  3. At risk for hyperglycemia Anna Martinez was given approximately 15 minutes of counseling today regarding prevention of hyperglycemia. She was advised of hyperglycemia causes and the fact hyperglycemia is often asymptomatic. Anna Martinez was instructed to avoid skipping meals, eat regular protein rich meals and schedule low calorie but protein rich snacks as needed.   Repetitive spaced learning was employed today to elicit superior memory formation and behavioral change  4. Obesity with current BMI of 49.6  Anna Martinez is currently in the action stage of change. As such, her goal is to continue with weight loss efforts. She has agreed to the Category 4 Plan.   Exercise goals: As is  Behavioral modification strategies: increasing lean protein intake, decreasing simple carbohydrates, increasing water intake, meal planning and cooking strategies and planning for success.  Anna Martinez has agreed to follow-up with our clinic in 4 weeks. She  was informed of the importance of frequent follow-up visits to maximize her success with intensive lifestyle modifications for her multiple health conditions.   Objective:   Blood pressure (!) 154/82, pulse 65, temperature 98 F (36.7 C), height 5\' 9"  (1.753 m), weight (!) 336 lb (152.4  kg), last menstrual period 07/21/2015, SpO2 96 %. Body mass index is 49.62 kg/m.  General: Cooperative, alert, well developed, in no acute distress. HEENT: Conjunctivae and lids unremarkable. Cardiovascular: Regular rhythm.  Lungs: Normal work of breathing. Neurologic: No focal deficits.   Lab Results  Component Value Date   CREATININE 0.84 04/07/2020   BUN 14 04/07/2020   NA 141 04/07/2020   K 4.0 04/07/2020   CL 101 04/07/2020   CO2 22 04/07/2020   Lab Results  Component Value Date   ALT 20 04/07/2020   AST 19 04/07/2020   ALKPHOS 143 (H) 04/07/2020   BILITOT 0.6 04/07/2020   Lab Results  Component Value Date   HGBA1C 6.6 (H) 04/07/2020   Lab Results  Component Value Date   INSULIN 17.0 04/07/2020   Lab Results  Component Value Date   TSH 3.970 04/07/2020   Lab Results  Component Value Date   CHOL 202 (H) 04/07/2020   HDL 44 04/07/2020   LDLCALC 141 (H) 04/07/2020   TRIG 96 04/07/2020   Lab Results  Component Value Date   WBC 9.0 04/07/2020   HGB 14.5 04/07/2020   HCT 44.6 04/07/2020   MCV 86 04/07/2020   PLT 316 04/07/2020   No results found for: IRON, TIBC, FERRITIN   Attestation Statements:   Reviewed by clinician on day of visit: allergies, medications, problem list, medical history, surgical history, family history, social history, and previous encounter notes.  Coral Ceo, CMA, am acting as transcriptionist for Mina Marble, NP.  I have reviewed the above documentation for accuracy and completeness, and I agree with the above. -  Hamda Klutts d. Eriyonna Matsushita, NP-C

## 2020-06-22 NOTE — Pre-Procedure Instructions (Signed)
Surgical Instructions:    Your procedure is scheduled on Wednesday, May 25th.  Report to [facility] Main Entrance "A" at 06:30 A.M., then check in with the Admitting office.  Call this number if you have any questions prior to, or have any problems the morning of surgery:  [phone]    Remember:  Do not eat after midnight the night before your surgery.  You may drink clear liquids until 05:30 AM the morning of your surgery.   Clear liquids allowed are: Water, Non-Citrus Juices (without pulp), Carbonated Beverages, Clear Tea, Black Coffee Only, and Gatorade.    Take these medicines the morning of surgery with A SIP OF WATER: buPROPion (WELLBUTRIN XL)   As of today, STOP taking any Aspirin (unless otherwise instructed by your surgeon) Aleve, Naproxen, Ibuprofen, Motrin, Advil, Goody's, BC's, all herbal medications, fish oil, and all vitamins.            HOW TO MANAGE YOUR DIABETES BEFORE AND AFTER SURGERY  Why is it important to control my blood sugar before and after surgery? . Improving blood sugar levels before and after surgery helps healing and can limit problems. . A way of improving blood sugar control is eating a healthy diet by: o  Eating less sugar and carbohydrates o  Increasing activity/exercise o  Talking with your doctor about reaching your blood sugar goals . High blood sugars (greater than 180 mg/dL) can raise your risk of infections and slow your recovery, so you will need to focus on controlling your diabetes during the weeks before surgery. . Make sure that the doctor who takes care of your diabetes knows about your planned surgery including the date and location.  How do I manage my blood sugar before surgery? . Check your blood sugar at least 4 times a day, starting 2 days before surgery, to make sure that the level is not too high or low. . Check your blood sugar the morning of your surgery when you wake up and every 2 hours until you get to the Short Stay  unit. o If your blood sugar is less than 70 mg/dL, you will need to treat for low blood sugar: - Do not take insulin. - Treat a low blood sugar (less than 70 mg/dL) with  cup of clear juice (cranberry or apple), 4 glucose tablets, OR glucose gel. - Recheck blood sugar in 15 minutes after treatment (to make sure it is greater than 70 mg/dL). If your blood sugar is not greater than 70 mg/dL on recheck, call [phone] for further instructions. . Report your blood sugar to the short stay nurse when you get to Short Stay.  . If you are admitted to the hospital after surgery: o Your blood sugar will be checked by the staff and you will probably be given insulin after surgery (instead of oral diabetes medicines) to make sure you have good blood sugar levels. o The goal for blood sugar control after surgery is 80-180 mg/dL.     Special instructions:   [facility]- Preparing For Surgery  Before surgery, you can play an important role. Because skin is not sterile, your skin needs to be as free of germs as possible. You can reduce the number of germs on your skin by washing with CHG (chlorahexidine gluconate) Soap before surgery.  CHG is an antiseptic cleaner which kills germs and bonds with the skin to continue killing germs even after washing.    Oral Hygiene is also important to reduce your  risk of infection.  Remember - BRUSH YOUR TEETH THE MORNING OF SURGERY WITH YOUR REGULAR TOOTHPASTE  Please do not use if you have an allergy to CHG or antibacterial soaps. If your skin becomes reddened/irritated stop using the CHG.  Do not shave (including legs and underarms) for at least 48 hours prior to first CHG shower. It is OK to shave your face.  Please follow these instructions carefully.   1. Shower the NIGHT BEFORE SURGERY and the MORNING OF SURGERY  2. If you chose to wash your hair, wash your hair first as usual with your normal shampoo.  3. After you shampoo, rinse your hair and body  thoroughly to remove the shampoo.  4. Wash Face and genitals (private parts) with your normal soap.   5. Use CHG Soap as you would any other liquid soap. You can apply CHG directly to the skin and wash gently with a scrungie or a clean washcloth.   6. Apply the CHG Soap to your body ONLY FROM THE NECK DOWN.  Do not use on open wounds or open sores. Avoid contact with your eyes, ears, mouth and genitals (private parts). Wash Face and genitals (private parts)  with your normal soap.   7. Wash thoroughly, paying special attention to the area where your surgery will be performed.  8. Thoroughly rinse your body with warm water from the neck down.  9. DO NOT shower/wash with your normal soap after using and rinsing off the CHG Soap.  10. Pat yourself dry with a CLEAN TOWEL.  11. Wear CLEAN PAJAMAS to bed the night before surgery.  12. Place CLEAN SHEETS on your bed the night before your surgery.  13. DO NOT SLEEP WITH PETS.   Day of Surgery: SHOWER with CHG soap. Brush your teeth WITH YOUR REGULAR TOOTHPASTE. Wear Clean/Comfortable clothing the morning of surgery. Do not apply any deodorants/lotions.   Do not wear jewelry, make up, or nail polish. Do not shave 48 hours prior to surgery.  Do not bring valuables to the hospital. [facility] is not responsible for any belongings or valuables.  Do NOT Smoke (Tobacco/Vaping) or drink Alcohol 24 hours prior to your procedure.   If you use a CPAP at night, you may bring all equipment for your overnight stay.   Contacts, glasses, or dentures may not be worn into surgery, please bring cases for these belongings.   For patients admitted to the hospital, discharge time will be determined by your treatment team.  Patients discharged the day of surgery will not be allowed to drive home, and someone needs to stay with them for 24 hours.    Please read over the following fact sheets that you were given.

## 2020-06-23 ENCOUNTER — Encounter (HOSPITAL_COMMUNITY): Payer: Self-pay

## 2020-06-23 ENCOUNTER — Encounter (HOSPITAL_COMMUNITY)
Admission: RE | Admit: 2020-06-23 | Discharge: 2020-06-23 | Disposition: A | Discharge status: Home / Self Care | Payer: BC Managed Care – PPO | Source: Ambulatory Visit | Attending: General Surgery | Admitting: General Surgery

## 2020-06-23 ENCOUNTER — Other Ambulatory Visit: Payer: Self-pay

## 2020-06-23 DIAGNOSIS — Z79899 Other long term (current) drug therapy: Secondary | ICD-10-CM | POA: Insufficient documentation

## 2020-06-23 DIAGNOSIS — C50912 Malignant neoplasm of unspecified site of left female breast: Secondary | ICD-10-CM | POA: Insufficient documentation

## 2020-06-23 DIAGNOSIS — Z01818 Encounter for other preprocedural examination: Secondary | ICD-10-CM | POA: Diagnosis present

## 2020-06-23 DIAGNOSIS — E119 Type 2 diabetes mellitus without complications: Secondary | ICD-10-CM | POA: Insufficient documentation

## 2020-06-23 DIAGNOSIS — I10 Essential (primary) hypertension: Secondary | ICD-10-CM | POA: Diagnosis not present

## 2020-06-23 DIAGNOSIS — Z6841 Body Mass Index (BMI) 40.0 and over, adult: Secondary | ICD-10-CM | POA: Insufficient documentation

## 2020-06-23 DIAGNOSIS — G4733 Obstructive sleep apnea (adult) (pediatric): Secondary | ICD-10-CM | POA: Insufficient documentation

## 2020-06-23 HISTORY — DX: Cardiac murmur, unspecified: R01.1

## 2020-06-23 LAB — BASIC METABOLIC PANEL
Anion gap: 8 (ref 5–15)
BUN: 19 mg/dL (ref 6–20)
CO2: 29 mmol/L (ref 22–32)
Calcium: 9 mg/dL (ref 8.9–10.3)
Chloride: 102 mmol/L (ref 98–111)
Creatinine, Ser: 0.83 mg/dL (ref 0.44–1.00)
GFR, Estimated: >60 mL/min (ref >60)
Glucose, Bld: 127 mg/dL — ABNORMAL HIGH (ref 70–99)
Potassium: 3.5 mmol/L (ref 3.5–5.1)
Sodium: 139 mmol/L (ref 135–145)

## 2020-06-23 LAB — HEMOGLOBIN A1C
Hgb A1c MFr Bld: 6.4 % — ABNORMAL HIGH (ref 4.8–5.6)
Mean Plasma Glucose: 136.98 mg/dL

## 2020-06-23 LAB — CBC
HCT: 43 % (ref 36.0–46.0)
Hemoglobin: 13.9 g/dL (ref 12.0–15.0)
MCH: 28.1 pg (ref 26.0–34.0)
MCHC: 32.3 g/dL (ref 30.0–36.0)
MCV: 86.9 fL (ref 80.0–100.0)
Platelets: 295 10*3/uL (ref 150–400)
RBC: 4.95 MIL/uL (ref 3.87–5.11)
RDW: 13.3 % (ref 11.5–15.5)
WBC: 8.8 10*3/uL (ref 4.0–10.5)
nRBC: 0 % (ref 0.0–0.2)

## 2020-06-23 LAB — GLUCOSE, CAPILLARY: Glucose-Capillary: 165 mg/dL — ABNORMAL HIGH (ref 70–99)

## 2020-06-23 NOTE — Progress Notes (Signed)
Anesthesia PAT Evaluation:  Case: [id number] Date/Time: 06/29/20 0815   Procedures:      LEFT BREAST LUMPECTOMY WITH RADIOACTIVE SEED AND SENTINEL LYMPH NODE BIOPSY (Left Breast)     DILATATION AND CURETTAGE /HYSTEROSCOPY WITH MYOSURE (N/A )   Anesthesia type: General   Pre-op diagnosis: LEFT BREAST CANCER, POSTMENOPAUSAL BLEEDING   Location: [facility] ROOM 02 / [facility]   Surgeons: Jovita Kussmaul, MD; Megan Salon, MD      DISCUSSION: Patient is a 60 year old female scheduled for the above procedures.  History includes never smoker, HTN, morbid obesity, OSA (CPAP), exertional dyspnea (chronic, stable), DM2, murmur (remotely told she had one, no issues), breast cancer (left biopsy 05/10/20: invasive mammary carcinoma, mammary carcinoma in situ), cholecystectomy 01/23/02. No evidence of malignancy on 05/26/20 endometrial biopsy. BMI 50.09.   I evaluated patient at PAT due to history of murmur. She reported being told by a provider in the distant past that she had a murmur. She later had a cardiology evaluation by Dr. Royal Hawthorn in 2003 for chest pain and said he never mentioned a murmur and does not think she ever had an echo. She did end up having a normal stress test then. No chest pain, SOB at rest, syncope. She reports chronic DOE for years which she feels is stable. She is still above to walk 20 minutes 2x/week and can clean her house (sweep, vacuum) without CV symptoms and without having to take frequent breaks. She was also in what sounds like a senior group fitness class until a few months ago and was not having any CV symptoms with those activities which she says were strenuous enough to get her HR up. She denied known CAD history. She does get occasional fluttering in her chest that may last just a few seconds, but no other associated symptoms like dizziness, syncope, diaphoresis. Since 04/07/20, she has been able to lose 17 lbs through exercise and lifestyle modification under the direction of the  [facility]. She uses CPAP at night.   I did not hear a murmur on exam. EKG showed SB at 57 bpm, no significant ST/T wave changes.   COVID-19 testing is scheduled for 06/27/20. RSL scheduled for 06/28/20 at 1:00 PM. Anesthesia team to evaluate on the day of surgery.     VS: BP (!) 134/97   Pulse 71   Temp 37 C (Oral)   Resp 20   Ht 5\' 9"  (1.753 m)   Wt (!) 153.9 kg   LMP 07/21/2015   SpO2 96%   BMI 50.09 kg/m  Provider wore N95 and universal face mask. Patient wore face mask. Heart RRR, no murmur noted. Lungs clear. No carotid bruits noted.    PROVIDERSMaurice Small, MD is PCP She is also followed at the [facility]. She is not followed by cardiology, but had a remote evaluate by now retired cardiologist Dr. Chase Picket. Records not available other than the 09/08/01 stress test which was normal.      LABS: Labs reviewed: Acceptable for surgery.  (all labs ordered are listed, but only abnormal results are displayed)  Labs Reviewed  GLUCOSE, CAPILLARY - Abnormal; Notable for the following components:      Result Value   Glucose-Capillary 165 (*)    All other components within normal limits  HEMOGLOBIN A1C - Abnormal; Notable for the following components:   Hgb A1c MFr Bld 6.4 (*)    All other  components within normal limits  BASIC METABOLIC PANEL - Abnormal; Notable for the following components:   Glucose, Bld 127 (*)    All other components within normal limits  CBC    EKG: EKG repeated at PAT since baseline wanderer/motion on 04/07/20 tracing, worse in limb leads, particularly I, II, aVR.   EKG 06/23/20: Sinus bradycardia at 57 bpm Cannot rule out Anterior infarct , age undetermined Abnormal ECG   CV: Denied prior echo, cath.  2 Day Stress Cardiolite 09/09/01:  IMPRESSION  NORMAL EXAM. RESTING LEFT VENTRICULAR EJECTION FRACTION 64%.   Past Medical History:  Diagnosis Date  . 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 Breast cancer ([place]) 05/2020  . Depression   . Diabetes ([place]) 2022  . Family history of breast cancer 05/30/2020  . Family history of pancreatic cancer 05/30/2020  . Family history of prostate cancer 05/30/2020  . Family history of renal cancer 05/30/2020  . Family history of skin cancer 05/30/2020  . Gallbladder problem   . Heart murmur    cardiologist a long time ago ECHO not completed  . Hypertension   . Knee pain   . Obesity   . Other fatigue   . Shortness of breath on exertion   . Sleep apnea    using c-pap machine  . Vitamin D deficiency     Past Surgical History:  Procedure Laterality Date  . laparoscopy cholectomy  2001    MEDICATIONS: . buPROPion (WELLBUTRIN XL) 150 MG 24 hr tablet  . Cholecalciferol (VITAMIN D) 125 MCG (5000 UT) CAPS  . mometasone (ELOCON) 0.1 % ointment  . polyethylene glycol (MIRALAX / GLYCOLAX) 17 g packet  . valsartan-hydrochlorothiazide (DIOVAN-HCT) 160-12.5 MG tablet  . Vitamin D, Ergocalciferol, (DRISDOL) 1.25 MG (50000 UNIT) CAPS capsule   No current facility-administered medications for this encounter.    Myra Gianotti, PA-C Surgical Short Stay/Anesthesiology [facility] Phone [phone] [facility] Phone [phone] 06/23/2020 5:36 PM

## 2020-06-23 NOTE — Progress Notes (Signed)
PCP - Maurice Small Cardiologist - Dr Rex Kras a long time heart murmur not mentioned at that visit   Chest x-ray - denies EKG - 04/07/20 Stress Test - denies ECHO - denies Cardiac Cath - denies  Sleep Study - > 5 years CPAP - yes bring mask and tubing DOS  Fasting Blood Sugar - doenst check at home trying diet controlled patient has lost 20 lbs since dxs   ERAS Protcol - yes clears until 0530 drink not ordered   COVID TEST- 06/27/20   Anesthesia review:   Patient denies shortness of breath, fever, cough and chest pain at PAT appointment   All instructions explained to the patient, with a verbal understanding of the material. Patient agrees to go over the instructions while at home for a better understanding. Patient also instructed to self quarantine after being tested for COVID-19. The opportunity to ask questions was provided.

## 2020-06-24 NOTE — Anesthesia Preprocedure Evaluation (Addendum)
Anesthesia Evaluation  Patient identified by MRN, date of birth, ID band Patient awake    Reviewed: Allergy & Precautions, H&P , NPO status , Patient's Chart, lab work & pertinent test results  Airway Mallampati: II   Neck ROM: full    Dental   Pulmonary sleep apnea ,    breath sounds clear to auscultation       Cardiovascular hypertension,  Rhythm:regular Rate:Normal     Neuro/Psych PSYCHIATRIC DISORDERS Depression    GI/Hepatic   Endo/Other  diabetes, Type 2Morbid obesityobese  Renal/GU      Musculoskeletal   Abdominal   Peds  Hematology   Anesthesia Other Findings   Reproductive/Obstetrics Breast CA                            Anesthesia Physical Anesthesia Plan  ASA: II  Anesthesia Plan: General and Regional   Post-op Pain Management:    Induction: Intravenous  PONV Risk Score and Plan: 3 and Ondansetron, Dexamethasone and Midazolam  Airway Management Planned: LMA  Additional Equipment:   Intra-op Plan:   Post-operative Plan: Extubation in OR  Informed Consent: I have reviewed the patients History and Physical, chart, labs and discussed the procedure including the risks, benefits and alternatives for the proposed anesthesia with the patient or authorized representative who has indicated his/her understanding and acceptance.     Dental advisory given  Plan Discussed with: CRNA, Anesthesiologist and Surgeon  Anesthesia Plan Comments: (PAT note written by Myra Gianotti, PA-C. )       Anesthesia Quick Evaluation

## 2020-06-27 ENCOUNTER — Other Ambulatory Visit (HOSPITAL_COMMUNITY)
Admission: RE | Admit: 2020-06-27 | Discharge: 2020-06-27 | Disposition: A | Discharge status: Home / Self Care | Payer: BC Managed Care – PPO | Source: Ambulatory Visit | Attending: General Surgery | Admitting: General Surgery

## 2020-06-27 ENCOUNTER — Telehealth (INDEPENDENT_AMBULATORY_CARE_PROVIDER_SITE_OTHER): Payer: BC Managed Care – PPO | Admitting: Psychology

## 2020-06-27 DIAGNOSIS — Z170 Estrogen receptor positive status [ER+]: Secondary | ICD-10-CM | POA: Diagnosis not present

## 2020-06-27 DIAGNOSIS — Z808 Family history of malignant neoplasm of other organs or systems: Secondary | ICD-10-CM | POA: Diagnosis not present

## 2020-06-27 DIAGNOSIS — Z01812 Encounter for preprocedural laboratory examination: Secondary | ICD-10-CM | POA: Insufficient documentation

## 2020-06-27 DIAGNOSIS — Z803 Family history of malignant neoplasm of breast: Secondary | ICD-10-CM | POA: Diagnosis not present

## 2020-06-27 DIAGNOSIS — E119 Type 2 diabetes mellitus without complications: Secondary | ICD-10-CM | POA: Diagnosis not present

## 2020-06-27 DIAGNOSIS — Z880 Allergy status to penicillin: Secondary | ICD-10-CM | POA: Diagnosis not present

## 2020-06-27 DIAGNOSIS — N840 Polyp of corpus uteri: Secondary | ICD-10-CM | POA: Diagnosis not present

## 2020-06-27 DIAGNOSIS — Z20822 Contact with and (suspected) exposure to covid-19: Secondary | ICD-10-CM | POA: Insufficient documentation

## 2020-06-27 DIAGNOSIS — Z8042 Family history of malignant neoplasm of prostate: Secondary | ICD-10-CM | POA: Diagnosis not present

## 2020-06-27 DIAGNOSIS — C50512 Malignant neoplasm of lower-outer quadrant of left female breast: Secondary | ICD-10-CM | POA: Diagnosis present

## 2020-06-27 DIAGNOSIS — Z800 Family history of malignant neoplasm of digestive organs: Secondary | ICD-10-CM | POA: Diagnosis not present

## 2020-06-27 DIAGNOSIS — Z79899 Other long term (current) drug therapy: Secondary | ICD-10-CM | POA: Diagnosis not present

## 2020-06-27 DIAGNOSIS — F3289 Other specified depressive episodes: Secondary | ICD-10-CM

## 2020-06-27 DIAGNOSIS — N9489 Other specified conditions associated with female genital organs and menstrual cycle: Secondary | ICD-10-CM | POA: Diagnosis not present

## 2020-06-27 DIAGNOSIS — N950 Postmenopausal bleeding: Secondary | ICD-10-CM | POA: Diagnosis not present

## 2020-06-27 DIAGNOSIS — Z8051 Family history of malignant neoplasm of kidney: Secondary | ICD-10-CM | POA: Diagnosis not present

## 2020-06-27 DIAGNOSIS — I10 Essential (primary) hypertension: Secondary | ICD-10-CM | POA: Diagnosis not present

## 2020-06-27 DIAGNOSIS — Z882 Allergy status to sulfonamides status: Secondary | ICD-10-CM | POA: Diagnosis not present

## 2020-06-27 LAB — SARS CORONAVIRUS 2 (TAT 6-24 HRS): SARS Coronavirus 2: NEGATIVE

## 2020-06-28 ENCOUNTER — Other Ambulatory Visit: Payer: Self-pay

## 2020-06-28 ENCOUNTER — Ambulatory Visit
Admission: RE | Admit: 2020-06-28 | Discharge: 2020-06-28 | Disposition: A | Discharge status: Home / Self Care | Payer: BC Managed Care – PPO | Source: Ambulatory Visit | Attending: General Surgery | Admitting: General Surgery

## 2020-06-28 DIAGNOSIS — C50512 Malignant neoplasm of lower-outer quadrant of left female breast: Secondary | ICD-10-CM

## 2020-06-28 DIAGNOSIS — Z170 Estrogen receptor positive status [ER+]: Secondary | ICD-10-CM

## 2020-06-29 ENCOUNTER — Ambulatory Visit (HOSPITAL_COMMUNITY): Payer: BC Managed Care – PPO | Admitting: Anesthesiology

## 2020-06-29 ENCOUNTER — Encounter (HOSPITAL_COMMUNITY): Payer: Self-pay | Admitting: General Surgery

## 2020-06-29 ENCOUNTER — Ambulatory Visit
Admission: RE | Admit: 2020-06-29 | Discharge: 2020-06-29 | Disposition: A | Discharge status: Home / Self Care | Payer: BC Managed Care – PPO | Source: Ambulatory Visit | Attending: General Surgery | Admitting: General Surgery

## 2020-06-29 ENCOUNTER — Ambulatory Visit (HOSPITAL_COMMUNITY)
Admission: RE | Admit: 2020-06-29 | Discharge: 2020-06-29 | Disposition: A | Discharge status: Home / Self Care | Payer: BC Managed Care – PPO | Source: Ambulatory Visit | Attending: General Surgery | Admitting: General Surgery

## 2020-06-29 ENCOUNTER — Other Ambulatory Visit: Payer: Self-pay

## 2020-06-29 ENCOUNTER — Ambulatory Visit (HOSPITAL_COMMUNITY)
Admission: RE | Admit: 2020-06-29 | Discharge: 2020-06-29 | Disposition: A | Discharge status: Home / Self Care | Payer: BC Managed Care – PPO | Attending: General Surgery | Admitting: General Surgery

## 2020-06-29 ENCOUNTER — Ambulatory Visit (HOSPITAL_COMMUNITY): Payer: BC Managed Care – PPO | Admitting: Vascular Surgery

## 2020-06-29 ENCOUNTER — Encounter (HOSPITAL_COMMUNITY)
Admission: RE | Disposition: A | Discharge status: Home / Self Care | Payer: Self-pay | Source: Home / Self Care | Attending: General Surgery

## 2020-06-29 DIAGNOSIS — N907 Vulvar cyst: Secondary | ICD-10-CM

## 2020-06-29 DIAGNOSIS — N840 Polyp of corpus uteri: Secondary | ICD-10-CM | POA: Diagnosis not present

## 2020-06-29 DIAGNOSIS — I10 Essential (primary) hypertension: Secondary | ICD-10-CM | POA: Insufficient documentation

## 2020-06-29 DIAGNOSIS — Z170 Estrogen receptor positive status [ER+]: Secondary | ICD-10-CM

## 2020-06-29 DIAGNOSIS — Z880 Allergy status to penicillin: Secondary | ICD-10-CM | POA: Insufficient documentation

## 2020-06-29 DIAGNOSIS — Z8051 Family history of malignant neoplasm of kidney: Secondary | ICD-10-CM | POA: Insufficient documentation

## 2020-06-29 DIAGNOSIS — Z803 Family history of malignant neoplasm of breast: Secondary | ICD-10-CM | POA: Insufficient documentation

## 2020-06-29 DIAGNOSIS — Z800 Family history of malignant neoplasm of digestive organs: Secondary | ICD-10-CM | POA: Insufficient documentation

## 2020-06-29 DIAGNOSIS — E119 Type 2 diabetes mellitus without complications: Secondary | ICD-10-CM | POA: Insufficient documentation

## 2020-06-29 DIAGNOSIS — C50512 Malignant neoplasm of lower-outer quadrant of left female breast: Secondary | ICD-10-CM

## 2020-06-29 DIAGNOSIS — Z882 Allergy status to sulfonamides status: Secondary | ICD-10-CM | POA: Insufficient documentation

## 2020-06-29 DIAGNOSIS — N9489 Other specified conditions associated with female genital organs and menstrual cycle: Secondary | ICD-10-CM | POA: Insufficient documentation

## 2020-06-29 DIAGNOSIS — Z808 Family history of malignant neoplasm of other organs or systems: Secondary | ICD-10-CM | POA: Insufficient documentation

## 2020-06-29 DIAGNOSIS — N950 Postmenopausal bleeding: Secondary | ICD-10-CM

## 2020-06-29 DIAGNOSIS — Z20822 Contact with and (suspected) exposure to covid-19: Secondary | ICD-10-CM | POA: Insufficient documentation

## 2020-06-29 DIAGNOSIS — Z8042 Family history of malignant neoplasm of prostate: Secondary | ICD-10-CM | POA: Insufficient documentation

## 2020-06-29 DIAGNOSIS — Z79899 Other long term (current) drug therapy: Secondary | ICD-10-CM | POA: Insufficient documentation

## 2020-06-29 HISTORY — PX: SENTINEL NODE BIOPSY: SHX6608

## 2020-06-29 HISTORY — PX: BREAST LUMPECTOMY WITH RADIOACTIVE SEED AND SENTINEL LYMPH NODE BIOPSY: SHX6550

## 2020-06-29 HISTORY — PX: CYST REMOVAL TRUNK: SHX6283

## 2020-06-29 HISTORY — PX: BREAST LUMPECTOMY: SHX2

## 2020-06-29 HISTORY — PX: HYSTEROSCOPY WITH D & C: SHX1775

## 2020-06-29 LAB — GLUCOSE, CAPILLARY: Glucose-Capillary: 125 mg/dL — ABNORMAL HIGH (ref 70–99)

## 2020-06-29 SURGERY — BREAST LUMPECTOMY WITH RADIOACTIVE SEED AND SENTINEL LYMPH NODE BIOPSY
Anesthesia: Regional | Site: Breast

## 2020-06-29 MED ORDER — 0.9 % SODIUM CHLORIDE (POUR BTL) OPTIME
TOPICAL | Status: DC | PRN
  Administered 2020-06-29: 1000 mL

## 2020-06-29 MED ORDER — FENTANYL CITRATE (PF) 100 MCG/2ML IJ SOLN: 100.0000 ug | Freq: Once | INTRAMUSCULAR | Status: AC

## 2020-06-29 MED ORDER — MIDAZOLAM HCL 2 MG/2ML IJ SOLN: 1.0000 mg | Freq: Once | INTRAMUSCULAR | Status: AC

## 2020-06-29 MED ORDER — ONDANSETRON HCL 4 MG/2ML IJ SOLN: 4.0000 mg | Freq: Four times a day (QID) | INTRAMUSCULAR | Status: DC | PRN

## 2020-06-29 MED ORDER — ROCURONIUM BROMIDE 10 MG/ML (PF) SYRINGE
PREFILLED_SYRINGE | INTRAVENOUS | Status: DC | PRN
  Administered 2020-06-29: 60 mg via INTRAVENOUS

## 2020-06-29 MED ORDER — LIDOCAINE-EPINEPHRINE 1 %-1:100000 IJ SOLN
INTRAMUSCULAR | Status: DC | PRN
  Administered 2020-06-29: 10 mL

## 2020-06-29 MED ORDER — ORAL CARE MOUTH RINSE: 15.0000 mL | Freq: Once | OROMUCOSAL | Status: AC

## 2020-06-29 MED ORDER — EPHEDRINE 5 MG/ML INJ
INTRAVENOUS | Status: AC
  Filled 2020-06-29: qty 10

## 2020-06-29 MED ORDER — FENTANYL CITRATE (PF) 100 MCG/2ML IJ SOLN: 25.0000 ug | INTRAMUSCULAR | Status: DC | PRN

## 2020-06-29 MED ORDER — SILVER NITRATE-POT NITRATE 75-25 % EX MISC
CUTANEOUS | Status: AC
  Filled 2020-06-29: qty 10

## 2020-06-29 MED ORDER — LIDOCAINE-EPINEPHRINE 1 %-1:100000 IJ SOLN
INTRAMUSCULAR | Status: AC
  Filled 2020-06-29: qty 1

## 2020-06-29 MED ORDER — GABAPENTIN 300 MG PO CAPS
300.0000 mg | ORAL_CAPSULE | ORAL | Status: AC
  Administered 2020-06-29: 300 mg via ORAL
  Filled 2020-06-29: qty 1

## 2020-06-29 MED ORDER — FENTANYL CITRATE (PF) 100 MCG/2ML IJ SOLN
INTRAMUSCULAR | Status: AC
  Administered 2020-06-29: 100 ug via INTRAVENOUS
  Filled 2020-06-29: qty 2

## 2020-06-29 MED ORDER — FENTANYL CITRATE (PF) 250 MCG/5ML IJ SOLN
INTRAMUSCULAR | Status: AC
  Filled 2020-06-29: qty 5

## 2020-06-29 MED ORDER — CHLORHEXIDINE GLUCONATE CLOTH 2 % EX PADS: 6.0000 | MEDICATED_PAD | Freq: Once | CUTANEOUS | Status: DC

## 2020-06-29 MED ORDER — CHLORHEXIDINE GLUCONATE 0.12 % MT SOLN
15.0000 mL | Freq: Once | OROMUCOSAL | Status: AC
  Administered 2020-06-29: 15 mL via OROMUCOSAL
  Filled 2020-06-29: qty 15

## 2020-06-29 MED ORDER — DEXMEDETOMIDINE (PRECEDEX) IN NS 20 MCG/5ML (4 MCG/ML) IV SYRINGE
PREFILLED_SYRINGE | INTRAVENOUS | Status: DC | PRN
  Administered 2020-06-29: 8 ug via INTRAVENOUS

## 2020-06-29 MED ORDER — METHYLENE BLUE 0.5 % INJ SOLN
INTRAVENOUS | Status: AC
  Filled 2020-06-29: qty 10

## 2020-06-29 MED ORDER — DEXAMETHASONE SODIUM PHOSPHATE 10 MG/ML IJ SOLN
INTRAMUSCULAR | Status: AC
  Filled 2020-06-29: qty 1

## 2020-06-29 MED ORDER — ROPIVACAINE HCL 5 MG/ML IJ SOLN
INTRAMUSCULAR | Status: DC | PRN
  Administered 2020-06-29: 20 mL via EPIDURAL

## 2020-06-29 MED ORDER — ONDANSETRON HCL 4 MG/2ML IJ SOLN
INTRAMUSCULAR | Status: AC
  Filled 2020-06-29: qty 2

## 2020-06-29 MED ORDER — LACTATED RINGERS IV SOLN: INTRAVENOUS | Status: DC

## 2020-06-29 MED ORDER — ACETAMINOPHEN 500 MG PO TABS
1000.0000 mg | ORAL_TABLET | ORAL | Status: AC
  Administered 2020-06-29: 1000 mg via ORAL
  Filled 2020-06-29: qty 2

## 2020-06-29 MED ORDER — TECHNETIUM TC 99M TILMANOCEPT KIT
1.0000 | PACK | Freq: Once | INTRAVENOUS | Status: AC | PRN
  Administered 2020-06-29: 1 via INTRADERMAL

## 2020-06-29 MED ORDER — OXYCODONE HCL 5 MG PO TABS: 5.0000 mg | ORAL_TABLET | Freq: Once | ORAL | Status: DC | PRN

## 2020-06-29 MED ORDER — ONDANSETRON HCL 4 MG/2ML IJ SOLN
INTRAMUSCULAR | Status: DC | PRN
  Administered 2020-06-29: 4 mg via INTRAVENOUS

## 2020-06-29 MED ORDER — LIDOCAINE 2% (20 MG/ML) 5 ML SYRINGE
INTRAMUSCULAR | Status: DC | PRN
  Administered 2020-06-29: 20 mg via INTRAVENOUS

## 2020-06-29 MED ORDER — LIDOCAINE 2% (20 MG/ML) 5 ML SYRINGE
INTRAMUSCULAR | Status: AC
  Filled 2020-06-29: qty 5

## 2020-06-29 MED ORDER — FENTANYL CITRATE (PF) 250 MCG/5ML IJ SOLN
INTRAMUSCULAR | Status: DC | PRN
  Administered 2020-06-29: 100 ug via INTRAVENOUS
  Administered 2020-06-29 (×3): 50 ug via INTRAVENOUS

## 2020-06-29 MED ORDER — SODIUM CHLORIDE (PF) 0.9 % IJ SOLN
INTRAMUSCULAR | Status: AC
  Filled 2020-06-29: qty 10

## 2020-06-29 MED ORDER — PROPOFOL 10 MG/ML IV BOLUS
INTRAVENOUS | Status: AC
  Filled 2020-06-29: qty 20

## 2020-06-29 MED ORDER — PROPOFOL 10 MG/ML IV BOLUS
INTRAVENOUS | Status: DC | PRN
  Administered 2020-06-29: 150 mg via INTRAVENOUS

## 2020-06-29 MED ORDER — VANCOMYCIN HCL IN DEXTROSE 1-5 GM/200ML-% IV SOLN
1000.0000 mg | INTRAVENOUS | Status: AC
  Administered 2020-06-29: 1000 mg via INTRAVENOUS
  Filled 2020-06-29: qty 200

## 2020-06-29 MED ORDER — SILVER NITRATE-POT NITRATE 75-25 % EX MISC
CUTANEOUS | Status: DC | PRN
  Administered 2020-06-29: 2

## 2020-06-29 MED ORDER — POVIDONE-IODINE 10 % EX SWAB
2.0000 "application " | Freq: Once | CUTANEOUS | Status: AC
  Administered 2020-06-29: 2 via TOPICAL

## 2020-06-29 MED ORDER — EPHEDRINE SULFATE-NACL 50-0.9 MG/10ML-% IV SOSY
PREFILLED_SYRINGE | INTRAVENOUS | Status: DC | PRN
  Administered 2020-06-29 (×3): 10 mg via INTRAVENOUS

## 2020-06-29 MED ORDER — MIDAZOLAM HCL 2 MG/2ML IJ SOLN
INTRAMUSCULAR | Status: DC | PRN
  Administered 2020-06-29: 1 mg via INTRAVENOUS

## 2020-06-29 MED ORDER — LACTATED RINGERS IV SOLN: INTRAVENOUS | Status: DC | PRN

## 2020-06-29 MED ORDER — MIDAZOLAM HCL 2 MG/2ML IJ SOLN
INTRAMUSCULAR | Status: AC
  Administered 2020-06-29: 1 mg via INTRAVENOUS
  Filled 2020-06-29: qty 2

## 2020-06-29 MED ORDER — BUPIVACAINE-EPINEPHRINE (PF) 0.25% -1:200000 IJ SOLN
INTRAMUSCULAR | Status: AC
  Filled 2020-06-29: qty 30

## 2020-06-29 MED ORDER — DEXAMETHASONE SODIUM PHOSPHATE 10 MG/ML IJ SOLN
INTRAMUSCULAR | Status: DC | PRN
  Administered 2020-06-29: 10 mg via INTRAVENOUS

## 2020-06-29 MED ORDER — BUPIVACAINE-EPINEPHRINE 0.25% -1:200000 IJ SOLN
INTRAMUSCULAR | Status: DC | PRN
  Administered 2020-06-29: 25 mL

## 2020-06-29 MED ORDER — OXYCODONE HCL 5 MG/5ML PO SOLN
5.0000 mg | Freq: Once | ORAL | Status: DC | PRN
Start: 2020-06-29 — End: 2020-06-29

## 2020-06-29 MED ORDER — MIDAZOLAM HCL 2 MG/2ML IJ SOLN
INTRAMUSCULAR | Status: AC
  Filled 2020-06-29: qty 2

## 2020-06-29 MED ORDER — HYDROCODONE-ACETAMINOPHEN 5-325 MG PO TABS: 1.0000 | ORAL_TABLET | Freq: Four times a day (QID) | ORAL | 0 refills | Status: DC | PRN

## 2020-06-29 MED ORDER — SUGAMMADEX SODIUM 200 MG/2ML IV SOLN
INTRAVENOUS | Status: DC | PRN
  Administered 2020-06-29: 200 mg via INTRAVENOUS

## 2020-06-29 MED ORDER — ROCURONIUM BROMIDE 10 MG/ML (PF) SYRINGE
PREFILLED_SYRINGE | INTRAVENOUS | Status: AC
  Filled 2020-06-29: qty 10

## 2020-06-29 SURGICAL SUPPLY — 40 items
APPLIER CLIP 9.375 MED OPEN (MISCELLANEOUS) ×10
BIPOLAR CUTTING LOOP 21FR (ELECTRODE) ×1
BLADE SURG 15 STRL LF DISP TIS (BLADE) ×1 IMPLANT
BLADE SURG 15 STRL SS (BLADE) ×5
CANISTER SUCT 3000ML PPV (MISCELLANEOUS) ×6 IMPLANT
CATH ROBINSON RED A/P 16FR (CATHETERS) ×6 IMPLANT
CHLORAPREP W/TINT 26 (MISCELLANEOUS) ×6 IMPLANT
CLIP APPLIE 9.375 MED OPEN (MISCELLANEOUS) ×6 IMPLANT
CNTNR URN SCR LID CUP LEK RST (MISCELLANEOUS) ×6 IMPLANT
CONT SPEC 4OZ STRL OR WHT (MISCELLANEOUS) ×10
COVER PROBE W GEL 5X96 (DRAPES) ×6 IMPLANT
COVER SURGICAL LIGHT HANDLE (MISCELLANEOUS) ×6 IMPLANT
COVER WAND RF STERILE (DRAPES) IMPLANT
DERMABOND ADVANCED (GAUZE/BANDAGES/DRESSINGS) ×1
DERMABOND ADVANCED .7 DNX12 (GAUZE/BANDAGES/DRESSINGS) ×1 IMPLANT
DEVICE DUBIN SPECIMEN MAMMOGRA (MISCELLANEOUS) ×6 IMPLANT
DEVICE MYOSURE LITE (MISCELLANEOUS) ×2 IMPLANT
DILATOR CANAL MILEX (MISCELLANEOUS) IMPLANT
DRAPE CHEST BREAST 15X10 FENES (DRAPES) ×6 IMPLANT
ELECT COATED BLADE 2.86 ST (ELECTRODE) ×6 IMPLANT
ELECT REM PT RETURN 9FT ADLT (ELECTROSURGICAL) ×5
ELECTRODE REM PT RTRN 9FT ADLT (ELECTROSURGICAL) ×5 IMPLANT
GLOVE BIO SURGEON STRL SZ7.5 (GLOVE) ×12 IMPLANT
GLOVE ECLIPSE 6.5 STRL STRAW (GLOVE) ×12 IMPLANT
GLOVE SURG UNDER POLY LF SZ7 (GLOVE) ×12 IMPLANT
GOWN STRL REUS W/ TWL LRG LVL3 (GOWN DISPOSABLE) ×20 IMPLANT
GOWN STRL REUS W/TWL LRG LVL3 (GOWN DISPOSABLE) ×20
KIT BASIN OR (CUSTOM PROCEDURE TRAY) ×6 IMPLANT
KIT MARKER MARGIN INK (KITS) ×6 IMPLANT
KIT PROCEDURE FLUENT (KITS) ×6 IMPLANT
LOOP CUTTING BIPOLAR 21FR (ELECTRODE) ×1 IMPLANT
NS IRRIG 1000ML POUR BTL (IV SOLUTION) ×6 IMPLANT
PACK GENERAL/GYN (CUSTOM PROCEDURE TRAY) ×6 IMPLANT
PACK VAGINAL MINOR WOMEN LF (CUSTOM PROCEDURE TRAY) ×6 IMPLANT
PAD OB MATERNITY 4.3X12.25 (PERSONAL CARE ITEMS) ×6 IMPLANT
SUT MNCRL AB 4-0 PS2 18 (SUTURE) ×10 IMPLANT
SUT VIC AB 3-0 SH 18 (SUTURE) ×6 IMPLANT
SYR CONTROL 10ML LL (SYRINGE) ×6 IMPLANT
TOWEL GREEN STERILE (TOWEL DISPOSABLE) ×6 IMPLANT
TOWEL GREEN STERILE FF (TOWEL DISPOSABLE) ×18 IMPLANT

## 2020-06-29 NOTE — Interval H&P Note (Signed)
History and Physical Interval Note:  06/29/2020 8:18 AM  Anna Martinez  has presented today for surgery, with the diagnosis of LEFT BREAST CANCER, POSTMENOPAUSAL BLEEDING.  The various methods of treatment have been discussed with the patient and family. After consideration of risks, benefits and other options for treatment, the patient has consented to  Procedure(s): LEFT BREAST LUMPECTOMY WITH RADIOACTIVE SEED AND SENTINEL LYMPH NODE BIOPSY (Left) DILATATION AND CURETTAGE /HYSTEROSCOPY WITH MYOSURE (N/A) as a surgical intervention.  The patient's history has been reviewed, patient examined, no change in status, stable for surgery.  I have reviewed the patient's chart and labs.  Questions were answered to the patient's satisfaction.     Autumn Messing III

## 2020-06-29 NOTE — Op Note (Addendum)
06/29/2020  10:56 AM  PATIENT:  Anna Martinez  60 y.o. female  PRE-OPERATIVE DIAGNOSIS:  LEFT BREAST CANCER, POSTMENOPAUSAL BLEEDING  POST-OPERATIVE DIAGNOSIS:  LEFT BREAST CANCER, POSTMENOPAUSAL BLEEDING VULVAR SEBACEOUS CYST  PROCEDURE:  Procedure(s): LEFT BREAST LUMPECTOMY WITH RADIOACTIVE SEED DILATATION AND CURETTAGE /HYSTEROSCOPY WITH MYOSURE LEFT SENTINEL LYMPH NODE BIOPSY REMOVAL OF SEBACEOUS CYST  SURGEON:  Megan Salon  ASSISTANTS: OR staff  ANESTHESIA:   general  ESTIMATED BLOOD LOSS: 20 mL  BLOOD ADMINISTERED:none   FLUIDS: 300cc LR for my portion of the procedure.  1300cc total.  UOP: 250cc drained with I&O cath prior to beginning my portion of the procedure  SPECIMEN:  Endometrial polyp and curettings  DISPOSITION OF SPECIMEN:  PATHOLOGY  FINDINGS: endometrial polyp filling endometrial cavity, atrophic endometrium  DESCRIPTION OF OPERATION: Patient was taken to the operating room.  She is placed in the supine position. SCDs were on her lower extremities and functioning properly. General anesthesia with an LMA was administered without difficulty. Dr. Marcie Bal, anesthesia, oversaw case.  Legs were then placed in the [place] in the low lithotomy position. The legs were lifted to the high lithotomy position and the Betadine prep was used on the inner thighs perineum and vagina x3. Patient was draped in a normal standard fashion. An in and out catheterization with a red rubber Foley catheter was performed. Approximately 250 cc of clear urine was noted.   Area on inner left labia where cyst was noted was identified.  1cc 15 lidocaine with eip (1:100,000U) instilled.  Using #15 blade, skin incision made.  Firm cyst was expressed.  Then cyst wall was teased out. Minimal bleeding was noted. Silver nitrate was used for excellent hemostasis.  Lesion was <0.5cm.  Then a bivalve speculum was placed the vagina. The anterior lip of the cervix was grasped with  single-tooth tenaculum.  A paracervical block of 1% lidocaine mixed one-to-one with epinephrine (1:100,000 units).  10 cc was used total. The cervix is dilated up to #21 [facility] dilators. The endometrial cavity sounded to 8 cm.  A Myosure hysteroscope was obtained. Normal saline was used as a hysteroscopic fluid. The hysteroscope was advanced through the endocervical canal into the endometrial cavity. The tubal ostia were noted bilaterally. Additional findings included a large polyp filling the endometrial cavity.  Using the [employer] lite device, the polyp was fully resected.  Before and after photos obtained.  The hysteroscope was removed. A #1 toothed curette was used to curette the cavity until rough gritty texture was noted in all quadrants. With revisualization of the hysteroscope, there was no longer any abnormal findings.  The tenaculum was removed from the anterior lip of the cervix. The speculum was removed from the vagina. The prep was cleansed of the patient's skin. The legs are positioned back in the supine position. Sponge, lap, needle, initially counts were correct x2. Patient was taken to recovery in stable condition.  COUNTS:  YES  PLAN OF CARE: Transfer to PACU

## 2020-06-29 NOTE — H&P (Signed)
Anna Martinez is an 60 y.o. female G14P1 SWF with postmenopausal bleeding, normal endometrial biopsy (with benign atrophic endometrium), thickened endometrium (10.61m) here for hysteroscopy possible polyp resection, D&C.  Pt is aware of the results of all of the above.  She is having a lumpectomy and SNB with Dr. TMarlou Starksdue to new diagnosis of breast cancer.  Combined procedure is scheduled.  Procedure discussed with patient.  Recovery and pain management discussed.  Risks discussed including but not limited to bleeding, rare risk of transfusion, infection, 1% risk of uterine perforation with risks of fluid deficit causing cardiac arrythmia, cerebral swelling and/or need to stop procedure early.  Fluid emboli and rare risk of death discussed.  DVT/PE, rare risk of risk of bowel/bladder/ureteral/vascular injury.  Patient aware if pathology abnormal she may need additional treatment.  All questions answered.    Pertinent Gynecological History: Menses: post-menopausal Bleeding: post menopausal bleeding Contraception: post menopausal status DES exposure: denies Blood transfusions: none Sexually transmitted diseases: no past history Previous GYN Procedures: normal vaginal delivery in 1998  Last mammogram: abnormal: breast cancer diagnosed with biopsy Date: 05/09/2020 Last pap: normal Date: 02/10/2018 neg with neg HR HPV OB History: G1, P1   Menstrual History: Patient's last menstrual period was 07/21/2015.    Past Medical History:  Diagnosis Date  . 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 [place]  . Depression   . [place]  . Family history of breast cancer 05/30/2020  . Family history of pancreatic cancer 05/30/2020  . Family history of prostate cancer 05/30/2020  . Family history of renal cancer 05/30/2020  . Family history of skin cancer 05/30/2020  . Gallbladder problem   . Heart murmur    cardiologist a long time ago ECHO not  completed  . Hypertension   . Knee pain   . Obesity   . Other fatigue   . Shortness of breath on exertion   . Sleep apnea    using c-pap machine  . Vitamin D deficiency     Past Surgical History:  Procedure Laterality Date  . laparoscopy cholectomy  2001    Family History  Problem Relation Age of Onset  . Hypertension Mother   . Hyperlipidemia Mother   . Heart disease Mother   . Obesity Mother   . Skin cancer Mother   . Hypertension Father   . Obesity Father   . Prostate cancer Father        dx 862s . Renal cancer Father        dx 841s . Cancer Father        mouth; dx after 574 . Skin cancer Father        dx after 553 . Cancer Paternal Grandmother        unknown type; ? pancreatic; dx after 566 . Pancreatic cancer Paternal Aunt        dx 62s . Skin cancer Maternal Grandmother        face  . Melanoma Maternal Uncle        mets; dx 731s . Cancer Paternal Uncle        unknown type; dx unknown age  . Breast cancer Cousin        dx 516s paternal cousin    Social History:  reports that she has never smoked. She has never used smokeless tobacco. She reports that she does not  drink alcohol and does not use drugs.  Allergies:  Allergies  Allergen Reactions  . Bactrim [Sulfamethoxazole-Trimethoprim] Itching and Rash  . Penicillins     ? Reaction as baby    No medications prior to admission.    Review of Systems  All other systems reviewed and are negative.   Last menstrual period 07/21/2015. Physical Exam Constitutional:      Appearance: Normal appearance.  Cardiovascular:     Rate and Rhythm: Normal rate and regular rhythm.     Pulses: Normal pulses.     Heart sounds: Normal heart sounds.  Pulmonary:     Effort: Pulmonary effort is normal.     Breath sounds: Normal breath sounds.  Musculoskeletal:        General: Normal range of motion.  Skin:    General: Skin is warm and dry.  Neurological:     General: No focal deficit present.     Mental  Status: She is alert.  Psychiatric:        Mood and Affect: Mood normal.     No results found for this or any previous visit (from the past 24 hour(s)).  MM LT RADIOACTIVE SEED LOC MAMMO GUIDE  Result Date: 06/28/2020 CLINICAL DATA:  60 year old female for radioactive seed localization of LEFT breast cancer prior to lumpectomy. EXAM: MAMMOGRAPHIC GUIDED RADIOACTIVE SEED LOCALIZATION OF THE LEFT BREAST COMPARISON:  Previous exam(s). FINDINGS: Patient presents for radioactive seed localization prior to LEFT lumpectomy. I met with the patient and we discussed the procedure of seed localization including benefits and alternatives. We discussed the high likelihood of a successful procedure. We discussed the risks of the procedure including infection, bleeding, tissue injury and further surgery. We discussed the low dose of radioactivity involved in the procedure. Informed, written consent was given. The usual time-out protocol was performed immediately prior to the procedure. Using mammographic guidance, sterile technique, 1% lidocaine and an I-125 radioactive seed, the RIBBON biopsy clip was localized using a LATERAL approach. The follow-up mammogram images confirm the seed in the expected location and were marked for Dr. Marlou Martinez. Follow-up survey of the patient confirms presence of the radioactive seed. Order number of I-125 seed:  [id number]. Total activity:  1.216 millicuries.  Reference Date: 06/09/2020 The patient tolerated the procedure well and was released from the [facility]. She was given instructions regarding seed removal. IMPRESSION: Radioactive seed localization LEFT breast. No apparent complications. Electronically Signed   By: Margarette Canada M.D.   On: 06/28/2020 13:35   Assessment/Plan: 60 yo G1P1 SWF with new diagnosis breast cancer having lumpectomy with SNB done with Dr. Marlou Martinez today also with new onset PMP bleeding here for hysteroscopy with possible polyp resection, D&C.    Megan Salon 06/29/2020, 5:33 AM

## 2020-06-29 NOTE — H&P (Signed)
Anna Martinez  Location: [facility] Patient #: [id number] DOB: 06-28-1960 Single / Language: Cleophus Molt / Race: White Female   History of Present Illness  The patient is a 60 year old female who presents with breast cancer. We are asked to see the patient in consultation by Dr. Hale Bogus to evaluate her for a new left breast cancer. The patient is a 60 year old white female who recently went for a routine screening mammogram. At that time she was found to have a 1.9 cm area of distortion in the lower outer quadrant of the left breast. The lymph nodes looked normal. The distortion was biopsied and came back as invasive lobular type of breast cancer that was ER and PR positive and HER-2 negative with a Ki-67 of 10%. She has no family history of breast cancer. She did have an aunt with pancreatic cancer and a father with renal cancer  Her past medical history is significant for diabetes and hypertension. She does not smoke.   Allergies Penicillin G Potassium *PENICILLINS*  Sulfa 10 *OPHTHALMIC AGENTS*   Medication History  Vitamin D (Oral) Specific strength unknown - Active. Valsartan-hydroCHLOROthiazide (Oral) Specific strength unknown - Active. Mometasone Furoate (0.1% Cream, External) Active. Medications Reconciled    Review of Systems General Not Present- Appetite Loss, Chills, Fatigue, Fever, Night Sweats, Weight Gain and Weight Loss. Note: All other systems negative (unless as noted in HPI & included Review of Systems) Skin Not Present- Change in Wart/Mole, Dryness, Hives, Jaundice, New Lesions, Non-Healing Wounds, Rash and Ulcer. HEENT Not Present- Earache, Hearing Loss, Hoarseness, Nose Bleed, Oral Ulcers, Ringing in the Ears, Seasonal Allergies, Sinus Pain, Sore Throat, Visual Disturbances, Wears glasses/contact lenses and Yellow Eyes. Respiratory Not Present- Bloody sputum, Chronic Cough, Difficulty Breathing, Snoring and Wheezing. Breast Not Present-  Breast Mass, Breast Pain, Nipple Discharge and Skin Changes. Cardiovascular Not Present- Chest Pain, Difficulty Breathing Lying Down, Leg Cramps, Palpitations, Rapid Heart Rate, Shortness of Breath and Swelling of Extremities. Gastrointestinal Not Present- Abdominal Pain, Bloating, Bloody Stool, Change in Bowel Habits, Chronic diarrhea, Constipation, Difficulty Swallowing, Excessive gas, Gets full quickly at meals, Hemorrhoids, Indigestion, Nausea, Rectal Pain and Vomiting. Female Genitourinary Not Present- Frequency, Nocturia, Painful Urination, Pelvic Pain and Urgency. Musculoskeletal Not Present- Back Pain, Joint Pain, Joint Stiffness, Muscle Pain, Muscle Weakness and Swelling of Extremities. Neurological Not Present- Decreased Memory, Fainting, Headaches, Numbness, Seizures, Tingling, Tremor, Trouble walking and Weakness. Psychiatric Not Present- Anxiety, Bipolar, Change in Sleep Pattern, Depression, Fearful and Frequent crying. Endocrine Not Present- Cold Intolerance, Excessive Hunger, Hair Changes, Heat Intolerance, Hot flashes and New Diabetes. Hematology Not Present- Easy Bruising, Excessive bleeding, Gland problems, HIV and Persistent Infections.  Vitals  Weight: 345.25 lb Height: 69.5in Body Surface Area: 2.62 m Body Mass Index: 50.25 kg/m  Pulse: 73 (Regular)        Physical Exam  General Mental Status-Alert. General Appearance-Consistent with stated age. Hydration-Well hydrated. Voice-Normal.  Head and Neck Head-normocephalic, atraumatic with no lesions or palpable masses. Trachea-midline. Thyroid Gland Characteristics - normal size and consistency.  Eye Eyeball - Bilateral-Extraocular movements intact. Sclera/Conjunctiva - Bilateral-No scleral icterus.  Chest and Lung Exam Chest and lung exam reveals -quiet, even and easy respiratory effort with no use of accessory muscles and on auscultation, normal breath sounds, no adventitious sounds  and normal vocal resonance. Inspection Chest Wall - Normal. Back - normal.  Breast Note: There is no palpable mass in either breast. There is no palpable axillary, supraclavicular, or cervical lymphadenopathy.   Cardiovascular  Cardiovascular examination reveals -normal heart sounds, regular rate and rhythm with no murmurs and normal pedal pulses bilaterally.  Abdomen Inspection Inspection of the abdomen reveals - No Hernias. Skin - Scar - no surgical scars. Palpation/Percussion Palpation and Percussion of the abdomen reveal - Soft, Non Tender, No Rebound tenderness, No Rigidity (guarding) and No hepatosplenomegaly. Auscultation Auscultation of the abdomen reveals - Bowel sounds normal.  Neurologic Neurologic evaluation reveals -alert and oriented x 3 with no impairment of recent or remote memory. Mental Status-Normal.  Musculoskeletal Normal Exam - Left-Upper Extremity Strength Normal and Lower Extremity Strength Normal. Normal Exam - Right-Upper Extremity Strength Normal and Lower Extremity Strength Normal.  Lymphatic Head & Neck  General Head & Neck Lymphatics: Bilateral - Description - Normal. Axillary  General Axillary Region: Bilateral - Description - Normal. Tenderness - Non Tender. Femoral & Inguinal  Generalized Femoral & Inguinal Lymphatics: Bilateral - Description - Normal. Tenderness - Non Tender.    Assessment & Plan MALIGNANT NEOPLASM OF LOWER-OUTER QUADRANT OF LEFT BREAST OF FEMALE, ESTROGEN RECEPTOR POSITIVE (C50.512) Impression: The patient appears to have a stage I lobular cancer in the lower outer quadrant of the left breast. I discussed with her in detail the different options for treatment and at this point she favors breast conservation which I feel is a very reasonable way of treating her cancer. She would be a good candidate for sentinel node biopsy as well. I have discussed with her in detail the risks and benefits of the operation as well  as some of the technical aspects and she understands and wishes to proceed. I will go ahead and refer her to medical and radiation oncology to discuss adjuvant therapy. We will also obtain an MRI study given the lobular nature of the cancer. She will also be sent for preoperative lymphedema testing. If the MRI looks similar to the information where we have then we will proceed with surgery. This patient encounter took 60 minutes today to perform the following: take history, perform exam, review outside records, interpret imaging, counsel the patient on their diagnosis and document encounter, findings & plan in the EHR Current Plans Referred to Oncology, for evaluation and follow up (Oncology). Routine. Referred to Physical Therapy, for evaluation and follow up (Physical Therapy). Routine. MRI, breast, w/o contrast material; bilateral(77047)(ACR 0 )(DSN [id number])(G-Code G1004(MG)) (Clinical Scenarios: No content available for this procedure)

## 2020-06-29 NOTE — Discharge Instructions (Addendum)
Hysteroscopy Hysteroscopy is a procedure used to look inside a woman's womb (uterus). This may be done for various reasons, including:  To look for tumors and other growths in the uterus.  To evaluate abnormal bleeding, fibroid tumors, polyps, scar tissue, or uterine cancer.  To determine why a woman is unable to get pregnant or has had repeated pregnancy losses.  To locate an IUD (intrauterine device).  To place a birth control device into the fallopian tubes. During this procedure, a thin, flexible tube with a small light and camera (hysteroscope) is used to examine the uterus. The camera sends images to a monitor in the room so that your health care provider can view the inside of your uterus. A hysteroscopy should be done right after a menstrual period. Tell a health care provider about:  Any allergies you have.  All medicines you are taking, including vitamins, herbs, eye drops, creams, and over-the-counter medicines.  Any problems you or family members have had with anesthetic medicines.  Any blood disorders you have.  Any surgeries you have had.  Any medical conditions you have.  Whether you are pregnant or may be pregnant.  Whether you have been diagnosed with an STI (sexually transmitted infection) or you think you have an STI. What are the risks? Generally, this is a safe procedure. However, problems may occur, including:  Excessive bleeding.  Infection.  Damage to the uterus or other structures or organs.  Allergic reaction to medicines or fluids that are used in the procedure. What happens before the procedure? Staying hydrated Follow instructions from your health care provider about hydration, which may include:  Up to 2 hours before the procedure - you may continue to drink clear liquids, such as water, clear fruit juice, black coffee, and plain tea. Eating and drinking restrictions Follow instructions from your health care provider about eating and  drinking, which may include:  8 hours before the procedure - stop eating solid foods and drink clear liquids only.  2 hours before the procedure - stop drinking clear liquids. Medicines  Ask your health care provider about: ? Changing or stopping your regular medicines. This is especially important if you are taking diabetes medicines or blood thinners. ? Taking medicines such as aspirin and ibuprofen. These medicines can thin your blood. Do not take these medicines unless your health care provider tells you to take them. ? Taking over-the-counter medicines, vitamins, herbs, and supplements.  Medicine may be placed in your cervix the day before the procedure. This medicine causes the cervix to open (dilate). The larger opening makes it easier for the hysteroscope to be inserted into the uterus during the procedure. General instructions  Ask your health care provider: ? What steps will be taken to help prevent infection. These steps may include:  Washing skin with a germ-killing soap.  Taking antibiotic medicine.  Do not use any products that contain nicotine or tobacco for at least 4 weeks before the procedure. These products include cigarettes, chewing tobacco, and vaping devices, such as e-cigarettes. If you need help quitting, ask your health care provider.  Plan to have a responsible adult take you home from the hospital or clinic.  Plan to have a responsible adult care for you for the time you are told after you leave the hospital or clinic. This is important.  Empty your bladder before the procedure begins. What happens during the procedure?  An IV will be inserted into one of your veins.  You may be given: ?  A medicine to help you relax (sedative). ? A medicine that numbs the area around the cervix (local anesthetic). ? A medicine to make you fall asleep (general anesthetic).  A hysteroscope will be inserted through your vagina and into your uterus.  Air or fluid will  be used to enlarge your uterus to allow your health care provider to see it better. The amount of fluid used will be carefully checked throughout the procedure.  In some cases, tissue may be gently scraped from inside the uterus and sent to a lab for testing (biopsy). The procedure may vary among health care providers and hospitals. What can I expect after the procedure?  Your blood pressure, heart rate, breathing rate, and blood oxygen level will be monitored until you leave the hospital or clinic.  You may have cramps. You may be given medicines for this.  You may have bleeding, which may vary from light spotting to menstrual-like bleeding. This is normal.  If you had a biopsy, it is up to you to get the results. Ask your health care provider, or the department that is doing the procedure, when your results will be ready. Follow these instructions at home: Activity  Rest as told by your health care provider.  Return to your normal activities as told by your health care provider. Ask your health care provider what activities are safe for you.  If you were given a sedative during the procedure, it can affect you for several hours. Do not drive or operate machinery until your health care provider says that it is safe. Medicines  Do not take aspirin or other NSAIDs during recovery, as told by your healthcare provider. It can increase the risk of bleeding.  Ask your health care provider if the medicine prescribed to you: ? Requires you to avoid driving or using machinery. ? Can cause constipation. You may need to take these actions to prevent or treat constipation:  Drink enough fluid to keep your urine pale yellow.  Take over-the-counter or prescription medicines.  Eat foods that are high in fiber, such as beans, whole grains, and fresh fruits and vegetables.  Limit foods that are high in fat and processed sugars, such as fried or sweet foods. General instructions  Do not douche,  use tampons, or have sex for 2 weeks after the procedure, or until your health care provider approves.  Do not take baths, swim, or use a hot tub until your health care provider approves. Take showers instead of baths for 2 weeks, or for as long as told by your health care provider.  Keep all follow-up visits. This is important. Contact a health care provider if:  You feel dizzy or lightheaded.  You feel nauseous.  You have abnormal vaginal discharge.  You have a rash.  You have pain that does not get better with medicine.  You have chills. Get help right away if:  You have bleeding that is heavier than a normal menstrual period.  You have a fever.  You have pain or cramps that get worse.  You develop new abdominal pain.  You faint.  You have pain in your shoulder.  You are short of breath. Summary  Hysteroscopy is a procedure that is used to look inside a woman's womb (uterus).  After the procedure, you may have bleeding, which varies from light spotting to menstrual-like bleeding. This is normal. You may also have cramps.  Do not douche, use tampons, or have sex for 2 weeks after  the procedure, or until your health care provider approves.  Plan to have a responsible adult take you home from the hospital or clinic. This information is not intended to replace advice given to you by your health care provider. Make sure you discuss any questions you have with your health care provider. Document Revised: 09/09/2019 Document Reviewed: 09/09/2019 Elsevier Patient Education  2021 [employer].

## 2020-06-29 NOTE — Op Note (Addendum)
06/29/2020  10:20 AM  PATIENT:  Anna Martinez  60 y.o. female  PRE-OPERATIVE DIAGNOSIS:  LEFT BREAST CANCER  POST-OPERATIVE DIAGNOSIS:  LEFT BREAST CANCER  PROCEDURE:  Procedure(s): LEFT BREAST LUMPECTOMY WITH RADIOACTIVE SEED LOCALIZATION AND DEEP LEFT AXILLARY SENTINEL LYMPH NODE BIOPSY (Left)  SURGEON:  Surgeon(s) and Role: Panel 1:    * Jovita Kussmaul, MD - Primary  PHYSICIAN ASSISTANT:   ASSISTANTS: none   ANESTHESIA:   local and general  EBL:  minimal   BLOOD ADMINISTERED:none  DRAINS: none   LOCAL MEDICATIONS USED:  MARCAINE     SPECIMEN:  Source of Specimen:  Left breast tissue and sentinel node x 2  DISPOSITION OF SPECIMEN:  PATHOLOGY  COUNTS:  YES  TOURNIQUET:  * No tourniquets in log *  DICTATION: .Dragon Dictation   After informed consent was obtained the patient was brought to the operating room and placed in the supine position on the operating table.  After adequate induction of general anesthesia the patient's left chest, breast, and axillary area were prepped with ChloraPrep, allowed to dry, and draped in usual sterile manner.  An appropriate timeout was performed.  Previously an I-125 seed was placed in the lower outer aspect of the left breast to mark an area of invasive breast cancer.  Also earlier in the day the patient underwent injection of 1 mCi of technetium sulfur colloid in the subareolar position on the left.  The neoprobe was initially set to technetium and an area of radioactivity was readily identified in the axilla.  The area overlying this was infiltrated with quarter percent Marcaine.  A small transversely oriented incision was made with a 15 blade knife overlying the area of radioactivity.  The incision was carried through the skin and subcutaneous tissue sharply with the electrocautery until the deep left axillary space was entered.  Blunt hemostat dissection was then carried out under the direction of the neoprobe.  I was able to identify 2  hot lymph nodes.  Each of these lymph nodes was excised sharply with the electrocautery and the surrounding small vessels and lymphatics were controlled with clips.  Ex vivo counts on these nodes ranged from 200 to 2000.  No other hot or palpable nodes were identified in the left axilla.  Hemostasis was achieved using the Bovie electrocautery.  The deep layer of the wound was then closed with interrupted 3-0 Vicryl stitches.  The skin was then closed with a running 4-0 Monocryl subcuticular stitch.  Attention was then turned to the left breast.  The neoprobe was set to I-125 in the area of radioactivity was readily identified.  An elliptical incision was then made in the skin overlying the area of radioactivity with a 15 blade knife.  The incision was carried through the skin and subcutaneous tissue sharply with the electrocautery.  Dissection was then carried widely around the radioactive seed while checking the area of radioactivity frequently.  Once the specimen was removed it was oriented with the appropriate paint colors.  A specimen radiograph was obtained that showed the clip and seed to be near the center of the specimen.  The specimen was then sent to pathology for further evaluation.  Hemostasis was achieved using the Bovie electrocautery.  The wound was irrigated with saline and infiltrated with more quarter percent Marcaine.  The cavity was marked with clips.  The deep layer of the wound was then closed with layers of interrupted 3-0 Vicryl stitches.  The skin was closed with  a running 4-0 Monocryl subcuticular stitch.  Dermabond dressings were applied.  The patient tolerated the procedure well.  At the end of the case all needle sponge and instrument counts were correct.  The patient is also undergoing a gynecologic procedure and at this point the operation was turned over to the other surgeon.  Her portion of the case will be dictated separately.  PLAN OF CARE: Discharge to home after PACU  PATIENT  DISPOSITION:  PACU - hemodynamically stable.   Delay start of Pharmacological VTE agent (>24hrs) due to surgical blood loss or risk of bleeding: not applicable

## 2020-06-29 NOTE — Anesthesia Procedure Notes (Signed)
Procedure Name: Intubation Date/Time: 06/29/2020 8:54 AM Performed by: Kathryne Hitch, CRNA Pre-anesthesia Checklist: Patient identified, Emergency Drugs available, Suction available and Patient being monitored Patient Re-evaluated:Patient Re-evaluated prior to induction Oxygen Delivery Method: Circle system utilized Preoxygenation: Pre-oxygenation with 100% oxygen Induction Type: IV induction Ventilation: Mask ventilation without difficulty Laryngoscope Size: Mac and 3 Tube type: Oral Tube size: 7.0 mm Number of attempts: 1 Airway Equipment and Method: Stylet and Oral airway Placement Confirmation: ETT inserted through vocal cords under direct vision,  positive ETCO2 and breath sounds checked- equal and bilateral Tube secured with: Tape Dental Injury: Teeth and Oropharynx as per pre-operative assessment

## 2020-06-29 NOTE — Transfer of Care (Signed)
Immediate Anesthesia Transfer of Care Note  Patient: Anna Martinez  Procedure(s) Performed: LEFT BREAST LUMPECTOMY WITH RADIOACTIVE SEED (Left Breast) LEFT SENTINEL LYMPH NODE BIOPSY (Left Axilla) DILATATION AND CURETTAGE /HYSTEROSCOPY WITH MYOSURE (N/A ) REMOVAL OF SEBACEOUS CYST (Left Vagina )  Patient Location: PACU  Anesthesia Type:General  Level of Consciousness: awake, alert , oriented and patient cooperative  Airway & Oxygen Therapy: Patient Spontanous Breathing and Patient connected to face mask oxygen  Post-op Assessment: Report given to RN, Post -op Vital signs reviewed and stable and Patient moving all extremities X 4  Post vital signs: Reviewed and stable  Last Vitals:  Vitals Value Taken Time  BP 130/77 06/29/20 1107  Temp    Pulse 60 06/29/20 1109  Resp 13 06/29/20 1108  SpO2 100 % 06/29/20 1109  Vitals shown include unvalidated device data.  Last Pain:  Vitals:   06/29/20 0820  TempSrc:   PainSc: 0-No pain         Complications: No complications documented.

## 2020-06-29 NOTE — Anesthesia Procedure Notes (Signed)
Anesthesia Regional Block: Pectoralis block   Pre-Anesthetic Checklist: ,, timeout performed, Correct Patient, Correct Site, Correct Laterality, Correct Procedure, Correct Position, site marked, Risks and benefits discussed,  Surgical consent,  Pre-op evaluation,  At surgeon's request and post-op pain management  Laterality: Left  Prep: chloraprep       Needles:  Injection technique: Single-shot  Needle Type: Echogenic Needle     Needle Length: 9cm  Needle Gauge: 21     Additional Needles:   Narrative:  Start time: 06/29/2020 8:02 AM End time: 06/29/2020 8:12 AM Injection made incrementally with aspirations every 5 mL.  Performed by: Personally  Anesthesiologist: Albertha Ghee, MD  Additional Notes: Pt tolerated the procedure well.

## 2020-06-30 ENCOUNTER — Encounter (HOSPITAL_COMMUNITY): Payer: Self-pay | Admitting: General Surgery

## 2020-06-30 NOTE — Anesthesia Postprocedure Evaluation (Signed)
Anesthesia Post Note  Patient: Anna Martinez  Procedure(s) Performed: LEFT BREAST LUMPECTOMY WITH RADIOACTIVE SEED (Left Breast) LEFT SENTINEL LYMPH NODE BIOPSY (Left Axilla) DILATATION AND CURETTAGE /HYSTEROSCOPY WITH MYOSURE (N/A ) REMOVAL OF SEBACEOUS CYST (Left Vagina )     Patient location during evaluation: PACU Anesthesia Type: Regional and General Level of consciousness: awake and alert Pain management: pain level controlled Vital Signs Assessment: post-procedure vital signs reviewed and stable Respiratory status: spontaneous breathing, nonlabored ventilation, respiratory function stable and patient connected to nasal cannula oxygen Cardiovascular status: blood pressure returned to baseline and stable Postop Assessment: no apparent nausea or vomiting Anesthetic complications: no   No complications documented.  Last Vitals:  Vitals:   06/29/20 1137 06/29/20 1152  BP: 127/77 130/76  Pulse:  (!) 56  Resp: 17 13  Temp:  (!) 36.4 C  SpO2:  100%    Last Pain:  Vitals:   06/29/20 1107  TempSrc:   PainSc: [place]

## 2020-07-02 ENCOUNTER — Other Ambulatory Visit (INDEPENDENT_AMBULATORY_CARE_PROVIDER_SITE_OTHER): Payer: Self-pay | Admitting: Adult Health

## 2020-07-02 DIAGNOSIS — E559 Vitamin D deficiency, unspecified: Secondary | ICD-10-CM

## 2020-07-07 ENCOUNTER — Telehealth: Payer: Self-pay | Admitting: *Deleted

## 2020-07-07 ENCOUNTER — Encounter: Payer: Self-pay | Admitting: *Deleted

## 2020-07-07 LAB — SURGICAL PATHOLOGY

## 2020-07-07 NOTE — Telephone Encounter (Signed)
Received order for oncotype testing. Requisition faxed to pathology and [facility] °

## 2020-07-20 ENCOUNTER — Ambulatory Visit: Payer: BC Managed Care – PPO | Attending: General Surgery

## 2020-07-20 ENCOUNTER — Encounter: Payer: Self-pay | Admitting: *Deleted

## 2020-07-20 ENCOUNTER — Other Ambulatory Visit: Payer: Self-pay

## 2020-07-20 DIAGNOSIS — R293 Abnormal posture: Secondary | ICD-10-CM | POA: Diagnosis not present

## 2020-07-20 DIAGNOSIS — Z483 Aftercare following surgery for neoplasm: Secondary | ICD-10-CM | POA: Diagnosis present

## 2020-07-20 DIAGNOSIS — C50912 Malignant neoplasm of unspecified site of left female breast: Secondary | ICD-10-CM | POA: Diagnosis present

## 2020-07-20 DIAGNOSIS — Z170 Estrogen receptor positive status [ER+]: Secondary | ICD-10-CM | POA: Insufficient documentation

## 2020-07-20 NOTE — Patient Instructions (Signed)
Start scar massage several minutes at a time 2 times per day. Across incision hod 5 sec and repeat to opposite side, diagonally and circles.  Make sure you are trying to stretch the skin to affect tissues underneath.  Lymphatic drainage: activate left groin Lymph nodes 5 times with upward half circles then starting at axilla work downward toward groin lightly stretching skin in down ward direction toward groin.  10-20 times, then repeat groin LN's 5 x's  [employer] class June 20 on [employer]. They will send a link the weekend before  [facility] screens every 3 months for 2 years  Have Dr. Marlou Starks check nodular area inferior to incision   During radiation keep a check on Range of Motion, and observe for swelling;call us immediately if having problems with Range of motion or swelling.

## 2020-07-20 NOTE — Therapy (Signed)
[place], [place], [address] Phone: [phone]   Fax:  [phone]  Physical Therapy Treatment  Patient Details  Name: Anna Martinez MRN: [id number] Date of Birth: 1960/06/19 Referring Provider (PT): Dr. Marlou Starks   Encounter Date: 07/20/2020   PT End of Session - 07/20/20 1007     Visit Number 2    Number of Visits 2    Date for PT Re-Evaluation 08/03/20    PT Start Time 0902    PT Stop Time 0938    PT Time Calculation (min) 36 min    Activity Tolerance Patient tolerated treatment well    Behavior During Therapy [facility] for tasks assessed/performed             Past Medical History:  Diagnosis Date   Abnormal Pap smear 08/13/12   LSIL may include HPV or mild Dysplasia (CIN1)   Breast cancer ([place]) 05/2020   Depression    Diabetes ([place]) 2022   Family history of breast cancer 05/30/2020   Family history of pancreatic cancer 05/30/2020   Family history of prostate cancer 05/30/2020   Family history of renal cancer 05/30/2020   Family history of skin cancer 05/30/2020   Gallbladder problem    Heart murmur    cardiologist a long time ago ECHO not completed   Hypertension    Knee pain    Obesity    Other fatigue    Shortness of breath on exertion    Sleep apnea    using c-pap machine   Vitamin D deficiency     Past Surgical History:  Procedure Laterality Date   BREAST LUMPECTOMY WITH RADIOACTIVE SEED AND SENTINEL LYMPH NODE BIOPSY Left 06/29/2020   Procedure: LEFT BREAST LUMPECTOMY WITH RADIOACTIVE SEED;  Surgeon: Jovita Kussmaul, MD;  Location: [place];  Service: General;  Laterality: Left;   CYST REMOVAL TRUNK Left 06/29/2020   Procedure: REMOVAL OF SEBACEOUS CYST;  Surgeon: Megan Salon, MD;  Location: [place];  Service: Gynecology;  Laterality: Left;   HYSTEROSCOPY WITH D & C N/A 06/29/2020   Procedure: DILATATION AND CURETTAGE /HYSTEROSCOPY WITH MYOSURE;  Surgeon: Megan Salon, MD;  Location: [place];  Service: Gynecology;  Laterality: N/A;   laparoscopy cholectomy  2001   SENTINEL NODE BIOPSY Left 06/29/2020   Procedure: LEFT SENTINEL LYMPH NODE BIOPSY;  Surgeon: Jovita Kussmaul, MD;  Location: [place];  Service: General;  Laterality: Left;    There were no vitals filed for this visit.   Subjective Assessment - 07/20/20 0900     Subjective Pt is being seen post op Left breast lumpectomy with SLNB on 06/29/2020.  Dont have to see the surgeon for 6 mos. Will start radiation for 4 weeks  in 2-3 weeks.  She does not need chemo.  I have a little swelling under arm and some tingling and every once in a while a shooting pain through the nipple and breast. She feels like she is moving her arm fairly. She will do ABC class    Pertinent History Pt was diagnosed in March with a routine mammogram.  She is now surgery on 06/29/20 for left  lumpectomy with SLNB. She also had a vulvar sebaceous cyst and endometrial polyp removed at the same time by Dr. Sabra Heck.    Patient Stated Goals assessment after surgery.    Currently in Pain? No/denies    Pain Score 0-No pain  [facility] PT Assessment - 07/20/20 0001       Assessment   Medical Diagnosis Left breast CA    Referring Provider (PT) Dr. Marlou Starks    Onset Date/Surgical Date 06/29/20    Hand Dominance Right      Precautions   Precaution Comments will be lymphedema risk      Prior Function   Level of Independence Independent      Cognition   Overall Cognitive Status Within Functional Limits for tasks assessed      Observation/Other Assessments   Observations 1 small stitch at breast incision still present, very mild swelling around axillary incision.  otherwise healing very well. small nodular area distal to axillary incision that pt will get checked by MD      AROM   Left Shoulder Extension 54 Degrees    Left Shoulder Flexion 170 Degrees    Left Shoulder ABduction 175 Degrees    Left Shoulder Internal Rotation 68 Degrees     Left Shoulder External Rotation 105 Degrees               LYMPHEDEMA/ONCOLOGY QUESTIONNAIRE - 07/20/20 0001       Surgeries   Lumpectomy Date 06/29/20    Sentinel Lymph Node Biopsy Date 06/29/20    Number Lymph Nodes Removed 2      Treatment   Active Chemotherapy Treatment No    Past Chemotherapy Treatment No    Active Radiation Treatment --   pending radiation   Past Radiation Treatment No    Current Hormone Treatment No   pending   Past Hormone Therapy No      What other symptoms do you have   Are you Having Heaviness or Tightness No    Are you having Pain Yes   tenderness under arm and occasional shooting pains   Are you having pitting edema No    Do you have infections No      Right Upper Extremity Lymphedema   At Base of 2nd Digit 6.8 cm      Left Upper Extremity Lymphedema   10 cm Proximal to Olecranon Process 42.6 cm    Olecranon Process 32.4 cm    10 cm Proximal to Ulnar Styloid Process 30.3 cm    Just Proximal to Ulnar Styloid Process 19.7 cm    At Base of 2nd Digit 6.6 cm                                PT Education - 07/20/20 1006     Education Details scar massage, ABC class, MLD to left trunk area, have nodular area checked by Dr. Marlou Starks, [place] screens    Person(s) Educated Patient    Methods Explanation;Handout   pt took a picture of instructions with her phone.  Printer in use   Comprehension Verbalized understanding                 PT Long Term Goals - 07/20/20 1304       PT LONG TERM GOAL #1   Title Pt will demonstrate full AROM of left shoulder post surgically    Period Weeks    Status Achieved    Target Date 07/20/20             Breast Clinic Goals - 07/20/20 1303       Patient will be able to verbalize understanding of pertinent lymphedema risk reduction practices relevant to her  diagnosis specifically related to skin care.   Time 1    Period Days    Status Achieved      Patient will be able to  return demonstrate and/or verbalize understanding of the post-op home exercise program related to regaining shoulder range of motion.   Time 1    Period Days    Status Achieved      Patient will be able to verbalize understanding of the importance of attending the postoperative After Breast Cancer Class for further lymphedema risk reduction education and therapeutic exercise.   Time 1    Period Days    Status Achieved                  Plan - 07/20/20 1302     Clinical Impression Statement Pt was reevaluated today approximately 3 weeks post surgery.  She presents with excellent left shoulder ROM, no measureable UE swelling and well healed incisions with glue still present and 1 small stitch present.. There is some very mild swelling inferior to the axilla. There is a small nodular area just distal to incision which she will have Dr. Marlou Starks check just to be safe.  She is signed up for the ABC class on July 25, 2020.  We discussed compression bras if small area of swelling does not resolve and pt was shown MLD to left inguinal LN and axillo inguinal pathway to help reduce swelling. She was advised to start scar massage when incision is fully healed and the stitch has dessolved or fallen out. She was reminded to continue ROM exercises during radiation and to be aware of swelling, and was advised to call us if she has any problems in those areas    Personal Factors and Comorbidities Comorbidity 2    Comorbidities breast Cancer, obesity    Stability/Clinical Decision Making Stable/Uncomplicated    Rehab Potential Excellent    PT Frequency 1x / week    PT Duration 8 weeks    PT Treatment/Interventions Therapeutic exercise;Patient/family education;Manual lymph drainage;Scar mobilization    PT Next Visit Plan DC    PT Home Exercise Plan 4 post op exercises, scar massage, MLD to left lat. trunk, ABC class    Recommended Other Services [facility] every 3 months    Consulted and Agree with Plan of Care  Patient             Patient will benefit from skilled therapeutic intervention in order to improve the following deficits and impairments:  Postural dysfunction, Decreased knowledge of precautions, Increased edema, Decreased scar mobility  Visit Diagnosis: Abnormal posture  Aftercare following surgery for neoplasm  Malignant neoplasm of left breast in female, estrogen receptor positive, unspecified site of breast [facility])     Problem List Patient Active Problem List   Diagnosis Date Noted   Postmenopausal bleeding    Endometrial polyp    Vulvar cyst    Diabetes mellitus ([place]) 06/22/2020   Genetic testing 06/06/2020   Family history of breast cancer 05/30/2020   Family history of prostate cancer 05/30/2020   Family history of skin cancer 05/30/2020   Family history of pancreatic cancer 05/30/2020   Family history of renal cancer 05/30/2020   Malignant neoplasm of lower-outer quadrant of left breast of female, estrogen receptor positive ([place]) 05/18/2020   Hyperlipidemia 03/24/2020   Mixed hyperlipidemia 03/24/2020   Moderate recurrent major depression ([place]) 03/24/2020   Snoring 03/24/2020   Vitamin D deficiency 76/72/0947   Lichen sclerosus et atrophicus 05/11/2019  Sleep apnea    Essential hypertension, benign 11/29/2015   Class 3 severe obesity with serious comorbidity and body mass index (BMI) of 50.0 to 59.9 in adult [facility]) 08/21/2014  PHYSICAL THERAPY DISCHARGE SUMMARY  Visits from Start of Care: 2  Current functional level related to goals / functional outcomes: Achieved goals   Remaining deficits: Mild inferior axillary swelling   Education / Equipment:NA   Patient agrees to discharge. Patient goals were met. Patient is being discharged due to meeting the stated rehab goals.  Claris Pong 07/20/2020, 1:14 PM  [place], [place], [address] Phone: [phone]   Fax:   [phone]  Name: Anna Martinez MRN: [id number] Date of Birth: April 16, 1960 Cheral Almas, PT 07/20/20 1:17 PM

## 2020-07-25 ENCOUNTER — Ambulatory Visit (INDEPENDENT_AMBULATORY_CARE_PROVIDER_SITE_OTHER): Payer: BC Managed Care – PPO | Admitting: Family Medicine

## 2020-07-26 ENCOUNTER — Encounter: Payer: Self-pay | Admitting: Hematology

## 2020-07-27 ENCOUNTER — Encounter: Payer: Self-pay | Admitting: *Deleted

## 2020-07-27 ENCOUNTER — Telehealth: Payer: Self-pay | Admitting: *Deleted

## 2020-07-27 DIAGNOSIS — C50512 Malignant neoplasm of lower-outer quadrant of left female breast: Secondary | ICD-10-CM

## 2020-07-27 NOTE — Telephone Encounter (Signed)
Received oncotype results of 15/4%. Patient is aware.  Referral placed for dr. Lisbeth Renshaw.

## 2020-07-28 ENCOUNTER — Encounter (HOSPITAL_COMMUNITY): Payer: Self-pay

## 2020-08-02 NOTE — Progress Notes (Signed)
New Breast Cancer Diagnosis: Left Breast- LOQ  06/29/2020       Family History of Breast/Ovarian/Prostate Cancer: Father had prostate cancer  Lymphedema issues, if any: No  Pain issues, if any:  No  SAFETY ISSUES: Prior radiation? No Pacemaker/ICD? No Possible current pregnancy? Postmenopausal Is the patient on methotrexate? No  Current Complaints / other details:

## 2020-08-03 ENCOUNTER — Other Ambulatory Visit: Payer: Self-pay

## 2020-08-03 ENCOUNTER — Encounter: Payer: Self-pay | Admitting: Radiation Oncology

## 2020-08-03 ENCOUNTER — Ambulatory Visit
Admission: RE | Admit: 2020-08-03 | Discharge: 2020-08-03 | Disposition: A | Discharge status: Home / Self Care | Payer: BC Managed Care – PPO | Source: Ambulatory Visit | Attending: Radiation Oncology | Admitting: Radiation Oncology

## 2020-08-03 VITALS — Ht 69.0 in | Wt 345.0 lb

## 2020-08-03 DIAGNOSIS — C50512 Malignant neoplasm of lower-outer quadrant of left female breast: Secondary | ICD-10-CM

## 2020-08-03 NOTE — Progress Notes (Signed)
Radiation Oncology         [phone] ________________________________  Outpatient Follow Up - Conducted via telephone due to current COVID-19 concerns for limiting patient exposure  I spoke with the patient to conduct this consult visit via telephone to spare the patient unnecessary potential exposure in the healthcare setting during the current COVID-19 pandemic. The patient was notified in advance and was offered a [place] meeting to allow for face to face communication but unfortunately reported that they did not have the appropriate resources/technology to support such a visit and instead preferred to proceed with a telephone visit.  Name: Anna Martinez        MRN: [id number]  Date of Service: 08/03/2020 DOB: 1960/08/10  [id number], Arbie Cookey, MD     REFERRING PHYSICIAN:  Dr. Marlou Starks   DIAGNOSIS: The encounter diagnosis was Malignant neoplasm of lower-outer quadrant of left breast of female, estrogen receptor positive ([place]).   HISTORY OF PRESENT ILLNESS: Anna Martinez is a 60 y.o. female with a newly diagnosed left breast cancer.  The patient was found to have a screening detected abnormality in the left breast in the 5:00 location.  Further diagnostic imaging measured the lesion at 1.9 cm in her axilla was negative for adenopathy within the left underarm.  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  Her tumor was ER/PR positive HER2 was negative and Ki-67 was 10%.    Since her last visit she underwent MRI of the breasts on  05/31/20 that revealed the known biopsy proven lesion in the left breast at 5:00, no additional concerns in either breast were noted and no abnormal axillary nodes were seen. She did have a work up as well for postmenopausal bleeding and a transvaginal ultrasound on 06/01/20 showed a 1.08 cm endometrial stripe. No other focal findings were noted. She subsequently underwent left lumpectomy and sentinel node biopsy with D&C on  06/29/20. Final pathology revealed benign endometrial polyp. Her left breast carcinoma was a grade 2 invasive ductal phenotype measuring 2.1 cm, and all 4 sampled left axillary nodes were negative for malignancy. An oncotype was 15 and no chemotherapy was recommended. She's contacted by phone today to discuss adjuvant radiotherapy.    PREVIOUS RADIATION THERAPY: No   PAST MEDICAL HISTORY:  Past Medical History:  Diagnosis Date   Abnormal Pap smear 08/13/12   LSIL may include HPV or mild Dysplasia (CIN1)   Breast cancer ([place]) 05/2020   Depression    Diabetes ([place]) 2022   Family history of breast cancer 05/30/2020   Family history of pancreatic cancer 05/30/2020   Family history of prostate cancer 05/30/2020   Family history of renal cancer 05/30/2020   Family history of skin cancer 05/30/2020   Gallbladder problem    Heart murmur    cardiologist a long time ago ECHO not completed   Hypertension    Knee pain    Obesity    Other fatigue    Shortness of breath on exertion    Sleep apnea    using c-pap machine   Vitamin D deficiency          PAST SURGICAL HISTORY: Past Surgical History:  Procedure Laterality Date   BREAST LUMPECTOMY WITH RADIOACTIVE SEED AND SENTINEL LYMPH NODE BIOPSY Left 06/29/2020   Procedure: LEFT BREAST LUMPECTOMY WITH RADIOACTIVE SEED;  Surgeon: Jovita Kussmaul, MD;  Location: [place];  Service: General;  Laterality: Left;   CYST REMOVAL TRUNK Left  06/29/2020   Procedure: REMOVAL OF SEBACEOUS CYST;  Surgeon: Megan Salon, MD;  Location: [place];  Service: Gynecology;  Laterality: Left;   HYSTEROSCOPY WITH D & C N/A 06/29/2020   Procedure: DILATATION AND CURETTAGE /HYSTEROSCOPY WITH MYOSURE;  Surgeon: Megan Salon, MD;  Location: [place];  Service: Gynecology;  Laterality: N/A;   laparoscopy cholectomy  2001   SENTINEL NODE BIOPSY Left 06/29/2020   Procedure: LEFT SENTINEL LYMPH NODE BIOPSY;  Surgeon: Jovita Kussmaul, MD;  Location: [place];  Service: General;  Laterality:  Left;     FAMILY HISTORY:  Family History  Problem Relation Age of Onset   Hypertension Mother    Hyperlipidemia Mother    Heart disease Mother    Obesity Mother    Skin cancer Mother    Hypertension Father    Obesity Father    Prostate cancer Father        dx 7s   Renal cancer Father        dx 53s   Cancer Father        mouth; dx after 7   Skin cancer Father        dx after 44   Cancer Paternal Grandmother        unknown type; ? pancreatic; dx after 50   Pancreatic cancer Paternal Aunt        dx 80s   Skin cancer Maternal Grandmother        face   Melanoma Maternal Uncle        mets; dx 57s   Cancer Paternal Uncle        unknown type; dx unknown age   Breast cancer Cousin        dx 83s; paternal cousin     SOCIAL HISTORY:  reports that she has never smoked. She has never used smokeless tobacco. She reports that she does not drink alcohol and does not use drugs. The patient lives in [place]. 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   ALLERGIES: Bactrim [sulfamethoxazole-trimethoprim] and Penicillins   MEDICATIONS:  Current Outpatient Medications  Medication Sig Dispense Refill   buPROPion (WELLBUTRIN XL) 150 MG 24 hr tablet Take 1 tablet (150 mg total) by mouth daily. 30 tablet 2   Cholecalciferol (VITAMIN D) 125 MCG (5000 UT) CAPS Take 5,000 Units by mouth every Monday, Wednesday, and Friday.     mometasone (ELOCON) 0.1 % ointment Apply topically as directed once or twice weekly (Patient taking differently: Apply 1 application topically 2 (two) times daily as needed. Apply topically as directed once or twice weekly) 45 g 2   polyethylene glycol (MIRALAX / GLYCOLAX) 17 g packet Take 17 g by mouth daily. (Patient taking differently: Take 17 g by mouth daily as needed for mild constipation.) 90 each 0   valsartan-hydrochlorothiazide (DIOVAN-HCT) 160-12.5 MG tablet Take 1 tablet by mouth daily.      Vitamin D, Ergocalciferol, (DRISDOL) 1.25 MG (50000 UNIT) CAPS capsule Take 1 capsule (50,000 Units total) by mouth every 7 (seven) days. (Patient taking differently: Take 50,000 Units by mouth every Thursday.) 4 capsule 0   No current facility-administered medications for this encounter.     REVIEW OF SYSTEMS: On review of systems, the patient is doing well and healing nicely. She reports some fullness in her left axilla but denies fevers, chills, LUE edema or pain. She denies redness of the site as well. She is considering scheduling  her colonoscopy and possibly an orthopedic procedure in the coming months as well. She reports her breast is smaller now that she has had lumpectomy on the left, and may consider reductions as she has large breasts in the future. No other complaints are verbalized.    PHYSICAL EXAM:  Wt Readings from Last 3 Encounters:  08/03/20 (!) 345 lb (156.5 kg)  06/29/20 (!) 339 lb 4.6 oz (153.9 kg)  06/23/20 (!) 339 lb 3 oz (153.9 kg)   Unable to assess due to encounter type.   ECOG = 0  0 - Asymptomatic (Fully active, able to carry on all predisease activities without restriction)  1 - Symptomatic but completely ambulatory (Restricted in physically strenuous activity but ambulatory and able to carry out work of a light or sedentary nature. For example, light housework, office work)  2 - Symptomatic, <50% in bed during the day (Ambulatory and capable of all self care but unable to carry out any work activities. Up and about more than 50% of waking hours)  3 - Symptomatic, >50% in bed, but not bedbound (Capable of only limited self-care, confined to bed or chair 50% or more of waking hours)  4 - Bedbound (Completely disabled. Cannot carry on any self-care. Totally confined to bed or chair)  5 - Death   Eustace Pen MM, Creech RH, Tormey DC, et al. [phone]). "Toxicity and response criteria of the [facility]". [place] Oncol. 5 (6):  [phone]    LABORATORY DATA:  Lab Results  Component Value Date   WBC 8.8 06/23/2020   HGB 13.9 06/23/2020   HCT 43.0 06/23/2020   MCV 86.9 06/23/2020   PLT 295 06/23/2020   Lab Results  Component Value Date   NA 139 06/23/2020   K 3.5 06/23/2020   CL 102 06/23/2020   CO2 29 06/23/2020   Lab Results  Component Value Date   ALT 20 04/07/2020   AST 19 04/07/2020   ALKPHOS 143 (H) 04/07/2020   BILITOT 0.6 04/07/2020      RADIOGRAPHY: No results found.      IMPRESSION/PLAN: 1. Stage IA, pT2N0M0 grade 2, ER/PR positive invasive lobular carcinoma of the left breast. Dr. Lisbeth Renshaw has reviewed her final pathology findings. She has healed well and is ready to consider adjuvant therapy. We reviewed the rationale for external radiotherapy to the breast  to reduce risks of local recurrence followed by antiestrogen therapy. We discussed the risks, benefits, short, and long term effects of radiotherapy, as well as the curative intent, and the patient is interested in proceeding. Dr. Lisbeth Renshaw has recommended proceeding with 4 weeks of radiotherapy with deep inspiration breath hold technique. Written consent is obtained and placed in the chart, a copy was provided to the patient. She will simulate on 08/09/20 at which time Dr. Lisbeth Renshaw will assess her axilla and sign written consent with her. 2. Left axillary fullness. I did offer the patient an appt to be seen this week and could even consider simulation sooner, but she prefers to wait until 08/09/20. She's encouraged to keep [place] informed if there were any concerns of redness, fevers, or pain and she is in agreement.  3. Plastic Surgery Considerations. The patient may be interested in oncoplastic reduction in the future given volume discrepancy following her lumpectomy, and her baseline history of large pendulous breast tissue. I offered referral to plastic surgery to Dr. Iran Planas in the future, and she may desire this about 6 months post radiation. The  patient  will let us know if and when she's ready to proceed with evaluation.   Given current concerns for patient exposure during the COVID-19 pandemic, this encounter was conducted via telephone.  The patient has provided two factor identification and has given verbal consent for this type of encounter and has been advised to only accept a meeting of this type in a secure network environment. The time spent during this encounter was 45 minutes including preparation, discussion, and coordination of the patient's care. The attendants for this meeting include Blenda Nicely, RN, Hayden Pedro  and Catheryn Bacon.  During the encounter,  Blenda Nicely, RN,  and Hayden Pedro were located at [facility] Radiation Oncology Department.  Anna Martinez was located at home.       Carola Rhine, [employer]   **Disclaimer: This note was dictated with voice recognition software. Similar sounding words can inadvertently be transcribed and this note may contain transcription errors which may not have been corrected upon publication of note.**

## 2020-08-04 ENCOUNTER — Encounter: Payer: Self-pay | Admitting: *Deleted

## 2020-08-05 ENCOUNTER — Other Ambulatory Visit (INDEPENDENT_AMBULATORY_CARE_PROVIDER_SITE_OTHER): Payer: Self-pay | Admitting: Adult Health

## 2020-08-05 DIAGNOSIS — E559 Vitamin D deficiency, unspecified: Secondary | ICD-10-CM

## 2020-08-08 ENCOUNTER — Encounter (INDEPENDENT_AMBULATORY_CARE_PROVIDER_SITE_OTHER): Payer: Self-pay | Admitting: Family Medicine

## 2020-08-09 ENCOUNTER — Ambulatory Visit
Admission: RE | Admit: 2020-08-09 | Discharge: 2020-08-09 | Disposition: A | Discharge status: Home / Self Care | Payer: BC Managed Care – PPO | Source: Ambulatory Visit | Attending: Radiation Oncology | Admitting: Radiation Oncology

## 2020-08-09 DIAGNOSIS — Z170 Estrogen receptor positive status [ER+]: Secondary | ICD-10-CM | POA: Insufficient documentation

## 2020-08-09 DIAGNOSIS — Z510 Encounter for antineoplastic radiation therapy: Secondary | ICD-10-CM | POA: Diagnosis present

## 2020-08-09 DIAGNOSIS — C50512 Malignant neoplasm of lower-outer quadrant of left female breast: Secondary | ICD-10-CM | POA: Insufficient documentation

## 2020-08-09 NOTE — Telephone Encounter (Signed)
Pt last seen by Katy Danford, FNP.  

## 2020-08-09 NOTE — Telephone Encounter (Signed)
Lov:  06/21/20 NV:  08/29/20 Cx appt tomorrow with Leafy Ro Was supposed to f/u in 4 weeks Last vitamin d level 04/07/20:  34.2  Refill request

## 2020-08-10 ENCOUNTER — Ambulatory Visit (INDEPENDENT_AMBULATORY_CARE_PROVIDER_SITE_OTHER): Payer: BC Managed Care – PPO | Admitting: Family Medicine

## 2020-08-15 ENCOUNTER — Encounter: Payer: Self-pay | Admitting: *Deleted

## 2020-08-16 ENCOUNTER — Telehealth: Payer: Self-pay | Admitting: Hematology

## 2020-08-16 ENCOUNTER — Ambulatory Visit: Payer: BC Managed Care – PPO | Admitting: Obstetrics & Gynecology

## 2020-08-16 ENCOUNTER — Other Ambulatory Visit (HOSPITAL_BASED_OUTPATIENT_CLINIC_OR_DEPARTMENT_OTHER): Payer: Self-pay | Admitting: Obstetrics & Gynecology

## 2020-08-16 ENCOUNTER — Ambulatory Visit: Payer: BC Managed Care – PPO

## 2020-08-16 ENCOUNTER — Telehealth (HOSPITAL_BASED_OUTPATIENT_CLINIC_OR_DEPARTMENT_OTHER): Payer: Self-pay

## 2020-08-16 DIAGNOSIS — R4586 Emotional lability: Secondary | ICD-10-CM

## 2020-08-16 MED ORDER — BUPROPION HCL ER (XL) 300 MG PO TB24: 300.0000 mg | ORAL_TABLET | Freq: Every day | ORAL | 8 refills | Status: DC

## 2020-08-16 NOTE — Telephone Encounter (Signed)
Anna Martinez is a 60 y.o. female would like to know if she need a post op appt with you or Dr. Marlou Starks. Pt also would like to know if you can increase her buPROPion. Pt would like to speak to Dr. Sabra Heck.

## 2020-08-16 NOTE — Telephone Encounter (Signed)
Scheduled appointment per 07/11 sch msg. Patient is aware. 

## 2020-08-17 ENCOUNTER — Ambulatory Visit: Payer: BC Managed Care – PPO | Admitting: Radiation Oncology

## 2020-08-17 DIAGNOSIS — Z510 Encounter for antineoplastic radiation therapy: Secondary | ICD-10-CM | POA: Diagnosis not present

## 2020-08-18 ENCOUNTER — Ambulatory Visit
Admission: RE | Admit: 2020-08-18 | Discharge: 2020-08-18 | Disposition: A | Discharge status: Home / Self Care | Payer: BC Managed Care – PPO | Source: Ambulatory Visit | Attending: Radiation Oncology | Admitting: Radiation Oncology

## 2020-08-18 DIAGNOSIS — Z510 Encounter for antineoplastic radiation therapy: Secondary | ICD-10-CM | POA: Diagnosis not present

## 2020-08-19 ENCOUNTER — Ambulatory Visit
Admission: RE | Admit: 2020-08-19 | Discharge: 2020-08-19 | Disposition: A | Discharge status: Home / Self Care | Payer: BC Managed Care – PPO | Source: Ambulatory Visit | Attending: Radiation Oncology | Admitting: Radiation Oncology

## 2020-08-19 DIAGNOSIS — Z510 Encounter for antineoplastic radiation therapy: Secondary | ICD-10-CM | POA: Diagnosis not present

## 2020-08-22 ENCOUNTER — Ambulatory Visit
Admission: RE | Admit: 2020-08-22 | Discharge: 2020-08-22 | Disposition: A | Discharge status: Home / Self Care | Payer: BC Managed Care – PPO | Source: Ambulatory Visit | Attending: Radiation Oncology | Admitting: Radiation Oncology

## 2020-08-22 DIAGNOSIS — Z510 Encounter for antineoplastic radiation therapy: Secondary | ICD-10-CM | POA: Diagnosis not present

## 2020-08-23 ENCOUNTER — Ambulatory Visit
Admission: RE | Admit: 2020-08-23 | Discharge: 2020-08-23 | Disposition: A | Discharge status: Home / Self Care | Payer: BC Managed Care – PPO | Source: Ambulatory Visit | Attending: Radiation Oncology | Admitting: Radiation Oncology

## 2020-08-23 DIAGNOSIS — Z510 Encounter for antineoplastic radiation therapy: Secondary | ICD-10-CM | POA: Diagnosis not present

## 2020-08-24 ENCOUNTER — Ambulatory Visit
Admission: RE | Admit: 2020-08-24 | Discharge: 2020-08-24 | Disposition: A | Discharge status: Home / Self Care | Payer: BC Managed Care – PPO | Source: Ambulatory Visit | Attending: Radiation Oncology | Admitting: Radiation Oncology

## 2020-08-24 DIAGNOSIS — Z510 Encounter for antineoplastic radiation therapy: Secondary | ICD-10-CM | POA: Diagnosis not present

## 2020-08-25 ENCOUNTER — Ambulatory Visit
Admission: RE | Admit: 2020-08-25 | Discharge: 2020-08-25 | Disposition: A | Discharge status: Home / Self Care | Payer: BC Managed Care – PPO | Source: Ambulatory Visit | Attending: Radiation Oncology | Admitting: Radiation Oncology

## 2020-08-25 DIAGNOSIS — Z510 Encounter for antineoplastic radiation therapy: Secondary | ICD-10-CM | POA: Diagnosis not present

## 2020-08-26 ENCOUNTER — Ambulatory Visit
Admission: RE | Admit: 2020-08-26 | Discharge: 2020-08-26 | Disposition: A | Discharge status: Home / Self Care | Payer: BC Managed Care – PPO | Source: Ambulatory Visit | Attending: Radiation Oncology | Admitting: Radiation Oncology

## 2020-08-26 DIAGNOSIS — Z510 Encounter for antineoplastic radiation therapy: Secondary | ICD-10-CM | POA: Diagnosis not present

## 2020-08-29 ENCOUNTER — Ambulatory Visit
Admission: RE | Admit: 2020-08-29 | Discharge: 2020-08-29 | Disposition: A | Discharge status: Home / Self Care | Payer: BC Managed Care – PPO | Source: Ambulatory Visit | Attending: Radiation Oncology | Admitting: Radiation Oncology

## 2020-08-29 ENCOUNTER — Other Ambulatory Visit: Payer: Self-pay

## 2020-08-29 ENCOUNTER — Encounter (INDEPENDENT_AMBULATORY_CARE_PROVIDER_SITE_OTHER): Payer: Self-pay | Admitting: Family Medicine

## 2020-08-29 ENCOUNTER — Ambulatory Visit (INDEPENDENT_AMBULATORY_CARE_PROVIDER_SITE_OTHER): Payer: BC Managed Care – PPO | Admitting: Family Medicine

## 2020-08-29 VITALS — BP 140/76 | HR 69 | Temp 98.2°F | Ht 69.0 in | Wt 340.0 lb

## 2020-08-29 DIAGNOSIS — Z6841 Body Mass Index (BMI) 40.0 and over, adult: Secondary | ICD-10-CM | POA: Diagnosis not present

## 2020-08-29 DIAGNOSIS — E559 Vitamin D deficiency, unspecified: Secondary | ICD-10-CM | POA: Diagnosis not present

## 2020-08-29 DIAGNOSIS — R252 Cramp and spasm: Secondary | ICD-10-CM | POA: Diagnosis not present

## 2020-08-29 DIAGNOSIS — Z510 Encounter for antineoplastic radiation therapy: Secondary | ICD-10-CM | POA: Diagnosis not present

## 2020-08-29 DIAGNOSIS — Z9189 Other specified personal risk factors, not elsewhere classified: Secondary | ICD-10-CM | POA: Diagnosis not present

## 2020-08-29 MED ORDER — VITAMIN D (ERGOCALCIFEROL) 1.25 MG (50000 UNIT) PO CAPS: 50000.0000 [IU] | ORAL_CAPSULE | ORAL | 0 refills | Status: DC

## 2020-08-30 ENCOUNTER — Other Ambulatory Visit (HOSPITAL_COMMUNITY): Payer: Self-pay | Admitting: Orthopedic Surgery

## 2020-08-30 ENCOUNTER — Ambulatory Visit (HOSPITAL_COMMUNITY)
Admission: RE | Admit: 2020-08-30 | Discharge: 2020-08-30 | Disposition: A | Discharge status: Home / Self Care | Payer: BC Managed Care – PPO | Source: Ambulatory Visit | Attending: Orthopedic Surgery | Admitting: Orthopedic Surgery

## 2020-08-30 ENCOUNTER — Other Ambulatory Visit: Payer: Self-pay | Admitting: Orthopedic Surgery

## 2020-08-30 ENCOUNTER — Ambulatory Visit
Admission: RE | Admit: 2020-08-30 | Discharge: 2020-08-30 | Disposition: A | Discharge status: Home / Self Care | Payer: BC Managed Care – PPO | Source: Ambulatory Visit | Attending: Radiation Oncology | Admitting: Radiation Oncology

## 2020-08-30 DIAGNOSIS — M79604 Pain in right leg: Secondary | ICD-10-CM

## 2020-08-30 DIAGNOSIS — M79605 Pain in left leg: Secondary | ICD-10-CM

## 2020-08-30 DIAGNOSIS — Z510 Encounter for antineoplastic radiation therapy: Secondary | ICD-10-CM | POA: Diagnosis not present

## 2020-08-30 DIAGNOSIS — M25561 Pain in right knee: Secondary | ICD-10-CM

## 2020-08-31 ENCOUNTER — Ambulatory Visit
Admission: RE | Admit: 2020-08-31 | Discharge: 2020-08-31 | Disposition: A | Discharge status: Home / Self Care | Payer: BC Managed Care – PPO | Source: Ambulatory Visit | Attending: Radiation Oncology | Admitting: Radiation Oncology

## 2020-08-31 ENCOUNTER — Other Ambulatory Visit: Payer: Self-pay

## 2020-08-31 DIAGNOSIS — Z510 Encounter for antineoplastic radiation therapy: Secondary | ICD-10-CM | POA: Diagnosis not present

## 2020-09-01 ENCOUNTER — Ambulatory Visit
Admission: RE | Admit: 2020-09-01 | Discharge: 2020-09-01 | Disposition: A | Discharge status: Home / Self Care | Payer: BC Managed Care – PPO | Source: Ambulatory Visit | Attending: Radiation Oncology | Admitting: Radiation Oncology

## 2020-09-01 DIAGNOSIS — Z510 Encounter for antineoplastic radiation therapy: Secondary | ICD-10-CM | POA: Diagnosis not present

## 2020-09-02 ENCOUNTER — Other Ambulatory Visit: Payer: Self-pay

## 2020-09-02 ENCOUNTER — Ambulatory Visit: Payer: BC Managed Care – PPO | Admitting: Radiation Oncology

## 2020-09-02 ENCOUNTER — Ambulatory Visit
Admission: RE | Admit: 2020-09-02 | Discharge: 2020-09-02 | Disposition: A | Discharge status: Home / Self Care | Payer: BC Managed Care – PPO | Source: Ambulatory Visit | Attending: Radiation Oncology | Admitting: Radiation Oncology

## 2020-09-02 DIAGNOSIS — Z510 Encounter for antineoplastic radiation therapy: Secondary | ICD-10-CM | POA: Diagnosis not present

## 2020-09-02 NOTE — Progress Notes (Signed)
Chief Complaint:   OBESITY Anna Martinez is here to discuss her progress with her obesity treatment plan along with follow-up of her obesity related diagnoses. Anna Martinez is on the Category 4 Plan and states she is following her eating plan approximately 50% of the time. Anna Martinez states she is doing 0 minutes 0 times per week.  Today's visit was #: 6 Starting weight: 353 lbs Starting date: 04/07/2020 Today's weight: 340 lbs Today's date: 08/29/2020 Total lbs lost to date: 13 Total lbs lost since last in-office visit: 0  Interim History: Anna Martinez has been struggling more with sugar cravings and eating out. She is also retaining some water weight.   Subjective:   1. Vitamin D deficiency Anna Martinez is stable on Vit D, and she requests a refill. Her level is not yet at goal.  2. Calf cramp (Left) Anna Martinez with last K+ 3.5. She has some cramping pain in her left calf at times.  3. At risk for dehydration Anna Martinez is at risk for dehydration due to inadequate water intake.  Assessment/Plan:   1. Vitamin D deficiency Low Vitamin D level contributes to fatigue and are associated with obesity, breast, and colon cancer. We will refill prescription Vitamin D for 1 month. Anna Martinez will follow-up for routine testing of Vitamin D, at least 2-3 times per year to avoid over-replacement.  - Vitamin D, Ergocalciferol, (DRISDOL) 1.25 MG (50000 UNIT) CAPS capsule; Take 1 capsule (50,000 Units total) by mouth every 7 (seven) days.  Dispense: 4 capsule; Refill: 0  2. Calf cramp (Left) High K+ foods were discussed with Anna Martinez, and she is to continue increasing her water intake.  3. At risk for dehydration Anna Martinez was given approximately 15 minutes dehydration prevention counseling today. Anna Martinez is at risk for dehydration due to weight loss and current medication(s). She was encouraged to hydrate and monitor fluid status to avoid dehydration as well as weight loss plateaus.   4. Obesity with current BMI of 50.2 Anna Martinez is currently in the action  stage of change. As such, her goal is to continue with weight loss efforts. She has agreed to the Category 4 Plan.   Behavioral modification strategies: increasing lean protein intake and decreasing simple carbohydrates.  Anna Martinez has agreed to follow-up with our clinic in 4 weeks. She was informed of the importance of frequent follow-up visits to maximize her success with intensive lifestyle modifications for her multiple health conditions.   Objective:   Blood pressure 140/76, pulse 69, temperature 98.2 F (36.8 C), height '5\' 9"'$  (1.753 m), weight (!) 340 lb (154.2 kg), last menstrual period 07/21/2015, SpO2 98 %. Body mass index is 50.21 kg/m.  General: Cooperative, alert, well developed, in no acute distress. HEENT: Conjunctivae and lids unremarkable. Cardiovascular: Regular rhythm.  Lungs: Normal work of breathing. Neurologic: No focal deficits.   Lab Results  Component Value Date   CREATININE 0.83 06/23/2020   BUN 19 06/23/2020   NA 139 06/23/2020   K 3.5 06/23/2020   CL 102 06/23/2020   CO2 29 06/23/2020   Lab Results  Component Value Date   ALT 20 04/07/2020   AST 19 04/07/2020   ALKPHOS 143 (H) 04/07/2020   BILITOT 0.6 04/07/2020   Lab Results  Component Value Date   HGBA1C 6.4 (H) 06/23/2020   HGBA1C 6.6 (H) 04/07/2020   Lab Results  Component Value Date   INSULIN 17.0 04/07/2020   Lab Results  Component Value Date   TSH 3.970 04/07/2020   Lab Results  Component  Value Date   CHOL 202 (H) 04/07/2020   HDL 44 04/07/2020   LDLCALC 141 (H) 04/07/2020   TRIG 96 04/07/2020   Lab Results  Component Value Date   VD25OH 34.2 04/07/2020   Lab Results  Component Value Date   WBC 8.8 06/23/2020   HGB 13.9 06/23/2020   HCT 43.0 06/23/2020   MCV 86.9 06/23/2020   PLT 295 06/23/2020   No results found for: IRON, TIBC, FERRITIN  Attestation Statements:   Reviewed by clinician on day of visit: allergies, medications, problem list, medical history, surgical  history, family history, social history, and previous encounter notes.   I, Trixie Dredge, am acting as transcriptionist for Dennard Nip, MD.  I have reviewed the above documentation for accuracy and completeness, and I agree with the above. -  Dennard Nip, MD

## 2020-09-04 ENCOUNTER — Ambulatory Visit
Admission: RE | Admit: 2020-09-04 | Discharge: 2020-09-04 | Disposition: A | Discharge status: Home / Self Care | Payer: BC Managed Care – PPO | Source: Ambulatory Visit | Attending: Orthopedic Surgery | Admitting: Orthopedic Surgery

## 2020-09-04 ENCOUNTER — Other Ambulatory Visit: Payer: Self-pay

## 2020-09-04 DIAGNOSIS — M25561 Pain in right knee: Secondary | ICD-10-CM

## 2020-09-05 ENCOUNTER — Ambulatory Visit
Admission: RE | Admit: 2020-09-05 | Discharge: 2020-09-05 | Disposition: A | Discharge status: Home / Self Care | Payer: BC Managed Care – PPO | Source: Ambulatory Visit | Attending: Radiation Oncology | Admitting: Radiation Oncology

## 2020-09-05 DIAGNOSIS — C50512 Malignant neoplasm of lower-outer quadrant of left female breast: Secondary | ICD-10-CM | POA: Insufficient documentation

## 2020-09-05 DIAGNOSIS — Z170 Estrogen receptor positive status [ER+]: Secondary | ICD-10-CM | POA: Diagnosis not present

## 2020-09-05 DIAGNOSIS — Z510 Encounter for antineoplastic radiation therapy: Secondary | ICD-10-CM | POA: Diagnosis not present

## 2020-09-06 ENCOUNTER — Ambulatory Visit
Admission: RE | Admit: 2020-09-06 | Discharge: 2020-09-06 | Disposition: A | Discharge status: Home / Self Care | Payer: BC Managed Care – PPO | Source: Ambulatory Visit | Attending: Radiation Oncology | Admitting: Radiation Oncology

## 2020-09-06 ENCOUNTER — Other Ambulatory Visit: Payer: Self-pay

## 2020-09-06 DIAGNOSIS — C50512 Malignant neoplasm of lower-outer quadrant of left female breast: Secondary | ICD-10-CM | POA: Diagnosis not present

## 2020-09-07 ENCOUNTER — Ambulatory Visit: Payer: BC Managed Care – PPO

## 2020-09-07 ENCOUNTER — Ambulatory Visit
Admission: RE | Admit: 2020-09-07 | Discharge: 2020-09-07 | Disposition: A | Discharge status: Home / Self Care | Payer: BC Managed Care – PPO | Source: Ambulatory Visit | Attending: Radiation Oncology | Admitting: Radiation Oncology

## 2020-09-07 DIAGNOSIS — C50512 Malignant neoplasm of lower-outer quadrant of left female breast: Secondary | ICD-10-CM | POA: Diagnosis not present

## 2020-09-08 ENCOUNTER — Other Ambulatory Visit: Payer: Self-pay

## 2020-09-08 ENCOUNTER — Ambulatory Visit: Payer: BC Managed Care – PPO

## 2020-09-08 ENCOUNTER — Ambulatory Visit
Admission: RE | Admit: 2020-09-08 | Discharge: 2020-09-08 | Disposition: A | Discharge status: Home / Self Care | Payer: BC Managed Care – PPO | Source: Ambulatory Visit | Attending: Radiation Oncology | Admitting: Radiation Oncology

## 2020-09-08 DIAGNOSIS — C50512 Malignant neoplasm of lower-outer quadrant of left female breast: Secondary | ICD-10-CM | POA: Diagnosis not present

## 2020-09-09 ENCOUNTER — Ambulatory Visit
Admission: RE | Admit: 2020-09-09 | Discharge: 2020-09-09 | Disposition: A | Discharge status: Home / Self Care | Payer: BC Managed Care – PPO | Source: Ambulatory Visit | Attending: Radiation Oncology | Admitting: Radiation Oncology

## 2020-09-09 ENCOUNTER — Ambulatory Visit: Payer: BC Managed Care – PPO

## 2020-09-09 DIAGNOSIS — C50512 Malignant neoplasm of lower-outer quadrant of left female breast: Secondary | ICD-10-CM | POA: Diagnosis not present

## 2020-09-12 ENCOUNTER — Ambulatory Visit: Payer: BC Managed Care – PPO

## 2020-09-12 ENCOUNTER — Encounter: Payer: Self-pay | Admitting: *Deleted

## 2020-09-12 ENCOUNTER — Other Ambulatory Visit: Payer: Self-pay

## 2020-09-12 ENCOUNTER — Ambulatory Visit
Admission: RE | Admit: 2020-09-12 | Discharge: 2020-09-12 | Disposition: A | Discharge status: Home / Self Care | Payer: BC Managed Care – PPO | Source: Ambulatory Visit | Attending: Radiation Oncology | Admitting: Radiation Oncology

## 2020-09-12 DIAGNOSIS — C50512 Malignant neoplasm of lower-outer quadrant of left female breast: Secondary | ICD-10-CM | POA: Diagnosis not present

## 2020-09-13 ENCOUNTER — Encounter (HOSPITAL_BASED_OUTPATIENT_CLINIC_OR_DEPARTMENT_OTHER): Payer: Self-pay

## 2020-09-13 ENCOUNTER — Ambulatory Visit
Admission: RE | Admit: 2020-09-13 | Discharge: 2020-09-13 | Disposition: A | Discharge status: Home / Self Care | Payer: BC Managed Care – PPO | Source: Ambulatory Visit | Attending: Radiation Oncology | Admitting: Radiation Oncology

## 2020-09-13 ENCOUNTER — Ambulatory Visit: Payer: BC Managed Care – PPO

## 2020-09-13 DIAGNOSIS — C50512 Malignant neoplasm of lower-outer quadrant of left female breast: Secondary | ICD-10-CM | POA: Diagnosis not present

## 2020-09-14 ENCOUNTER — Other Ambulatory Visit (HOSPITAL_BASED_OUTPATIENT_CLINIC_OR_DEPARTMENT_OTHER): Payer: Self-pay | Admitting: Obstetrics & Gynecology

## 2020-09-14 ENCOUNTER — Inpatient Hospital Stay: Payer: BC Managed Care – PPO | Attending: Hematology | Admitting: Hematology

## 2020-09-14 ENCOUNTER — Other Ambulatory Visit: Payer: Self-pay

## 2020-09-14 ENCOUNTER — Ambulatory Visit
Admission: RE | Admit: 2020-09-14 | Discharge: 2020-09-14 | Disposition: A | Discharge status: Home / Self Care | Payer: BC Managed Care – PPO | Source: Ambulatory Visit | Attending: Radiation Oncology | Admitting: Radiation Oncology

## 2020-09-14 ENCOUNTER — Encounter: Payer: Self-pay | Admitting: Radiation Oncology

## 2020-09-14 ENCOUNTER — Other Ambulatory Visit: Payer: Self-pay | Admitting: Adult Health

## 2020-09-14 ENCOUNTER — Encounter: Payer: Self-pay | Admitting: Hematology

## 2020-09-14 VITALS — BP 145/91 | HR 72 | Temp 98.0°F | Resp 20 | Ht 69.0 in | Wt 340.0 lb

## 2020-09-14 DIAGNOSIS — E2839 Other primary ovarian failure: Secondary | ICD-10-CM | POA: Diagnosis not present

## 2020-09-14 DIAGNOSIS — Z79899 Other long term (current) drug therapy: Secondary | ICD-10-CM | POA: Insufficient documentation

## 2020-09-14 DIAGNOSIS — C50512 Malignant neoplasm of lower-outer quadrant of left female breast: Secondary | ICD-10-CM | POA: Diagnosis not present

## 2020-09-14 DIAGNOSIS — Z79811 Long term (current) use of aromatase inhibitors: Secondary | ICD-10-CM | POA: Diagnosis not present

## 2020-09-14 DIAGNOSIS — Z170 Estrogen receptor positive status [ER+]: Secondary | ICD-10-CM | POA: Diagnosis not present

## 2020-09-14 DIAGNOSIS — Z1211 Encounter for screening for malignant neoplasm of colon: Secondary | ICD-10-CM

## 2020-09-14 MED ORDER — EXEMESTANE 25 MG PO TABS: 25.0000 mg | ORAL_TABLET | Freq: Every day | ORAL | 2 refills | Status: DC

## 2020-09-14 NOTE — Progress Notes (Signed)
[facility]   [phone] [phone]   Clinic Follow up Note   Patient Care Team: Maurice Small, MD as PCP - General (Family Medicine) Rockwell Germany, RN as Oncology Nurse Navigator Mauro Kaufmann, RN as Oncology Nurse Navigator  Date of Service:  09/14/2020  CHIEF COMPLAINT: f/u of left breast cancer  SUMMARY OF ONCOLOGIC HISTORY: Oncology History Overview Note  Cancer Staging Malignant neoplasm of lower-outer quadrant of left breast of female, estrogen receptor positive ([place]) Staging form: Breast, AJCC 8th Edition - Clinical stage from 05/10/2020: Stage IA (cT1c, cN0, cM0, G2, ER+, PR+, HER2-) - Signed by Gardenia Phlegm, NP on 05/18/2020 Histologic grading system: 3 grade system - Pathologic stage from 08/03/2020: Stage IA (pT2, pN0(sn), cM0, G2, ER+, PR+, HER2-, Oncotype DX score: 15) - Signed by Truitt Merle, MD on 09/14/2020 Method of lymph node assessment: Sentinel lymph node biopsy Multigene prognostic tests performed: Oncotype DX Recurrence score range: Greater than or equal to 11 Histologic grading system: 3 grade system    Malignant neoplasm of lower-outer quadrant of left breast of female, estrogen receptor positive ([place])  05/09/2020 Breast US   IMPRESSION: 1. Suspicious 1.9 cm mass with associated architectural distortion involving the LOWER OUTER QUADRANT of the LEFT breast at the 5 o'clock position approximately 3 cm from nipple. 2. No pathologic LEFT axillary lymphadenopathy.   05/10/2020 Cancer Staging   Staging form: Breast, AJCC 8th Edition - Clinical stage from 05/10/2020: Stage IA (cT1c, cN0, cM0, G2, ER+, PR+, HER2-) - Signed by Gardenia Phlegm, NP on 05/18/2020  Histologic grading system: 3 grade system    05/10/2020 Initial Biopsy   Diagnosis Breast, left, needle core biopsy, 5 o'clock - INVASIVE MAMMARY CARCINOMA - MAMMARY CARCINOMA IN-SITU - SEE COMMENT Microscopic Comment The biopsy material shows an  infiltrative proliferation of cells arranged linearly and in small clusters. Based on the biopsy, the carcinoma appears Nottingham grade 2 of 3 and measures 1.5 cm in greatest linear extent. The tumor cells are NEGATIVE for Her2 (1+).  Estrogen Receptor: 80%, POSITIVE, STRONG-MODERATE STAINING INTENSITY Progesterone Receptor: 90%, POSITIVE, STRONG STAINING INTENSITY Proliferation Marker Ki67: 10% E-cadherin is NEGATIVE supporting lobular origin.   05/18/2020 Initial Diagnosis   Malignant neoplasm of lower-outer quadrant of left breast of female, estrogen receptor positive ([place])    05/31/2020 Imaging   Breast MRI IMPRESSION: 1. 1.5 cm heterogeneously enhancing, biopsy-proven, carcinoma in the lower slightly outer aspect of the left breast, at 5 o'clock. 2. No evidence of additional left breast carcinoma. 3. No evidence of right breast carcinoma. 4. No abnormal lymph nodes   06/06/2020 Genetic Testing   No pathogenic variants detected in [facility] CancerNext-Expanded +RNAinsight Panel.  The report date is Jun 13, 2020.  The CancerNext-Expanded gene panel offered by [facility] and includes sequencing, rearrangement, and RNA analysis for the following 77 genes: AIP, ALK, APC, ATM, AXIN2, BAP1, BARD1, BLM, BMPR1A, BRCA1, BRCA2, BRIP1, CDC73, CDH1, CDK4, CDKN1B, CDKN2A, CHEK2, CTNNA1, DICER1, FANCC, FH, FLCN, GALNT12, KIF1B, LZTR1, MAX, MEN1, MET, MLH1, MSH2, MSH3, MSH6, MUTYH, NBN, NF1, NF2, NTHL1, PALB2, PHOX2B, PMS2, POT1, PRKAR1A, PTCH1, PTEN, RAD51C, RAD51D, RB1, RECQL, RET, SDHA, SDHAF2, SDHB, SDHC, SDHD, SMAD4, SMARCA4, SMARCB1, SMARCE1, STK11, SUFU, TMEM127, TP53, TSC1, TSC2, VHL and XRCC2 (sequencing and deletion/duplication); EGFR, EGLN1, HOXB13, KIT, MITF, PDGFRA, POLD1, and POLE (sequencing only); EPCAM and GREM1 (deletion/duplication only).    06/29/2020 Pathology Results   FINAL MICROSCOPIC DIAGNOSIS:   A. BREAST, LEFT, LUMPECTOMY:  - Invasive lobular  carcinoma, grade 2, spanning 2.1 cm.   - Lobular carcinoma in situ.  - Biopsy site.  - Margins are negative for carcinoma.  - See oncology table.   B. LYMPH NODE, LEFT #1, SENTINEL, EXCISION:  - One of one lymph nodes negative for carcinoma (0/1).   C. LYMPH NODE, LEFT, SENTINEL, EXCISION:  - One of one lymph nodes negative for carcinoma (0/1).   D. LYMPH NODE, LEFT, SENTINEL, EXCISION:  - One of one lymph nodes negative for carcinoma (0/1).   E. LYMPH NODE, LEFT #2, SENTINEL, EXCISION:  - One of one lymph nodes negative for carcinoma (0/1).   06/29/2020 Oncotype testing   Recurrence score of 15 predicts a risk of recurrence outside the breast over the next 9 years of 4%, if the patient's only systemic therapy is an antiestrogen for 5 years.    08/03/2020 Cancer Staging   Staging form: Breast, AJCC 8th Edition - Pathologic stage from 08/03/2020: Stage IA (pT2, pN0(sn), cM0, G2, ER+, PR+, HER2-, Oncotype DX score: 15) - Signed by Truitt Merle, MD on 09/14/2020  Method of lymph node assessment: Sentinel lymph node biopsy  Multigene prognostic tests performed: Oncotype DX  Recurrence score range: Greater than or equal to 11  Histologic grading system: 3 grade system       CURRENT THERAPY:  Adjuvant radiation   INTERVAL HISTORY:  Anna Martinez is here for a follow up of breast cancer. She was last seen by me on 05/30/2020. She presents to the clinic alone today.  She underwent left breast lumpectomy and sentinel lymph node biopsy on Jun 29, 2020, she recovered well from surgery.  She is finishing adjuvant radiation today.  She has moderate radiation dermatitis, with some skin peeling, mild soreness, mild fatigue, overall doing well with radiation.   All other systems were reviewed with the patient and are negative.  MEDICAL HISTORY:  Past Medical History:  Diagnosis Date   Abnormal Pap smear 08/13/12   LSIL may include HPV or mild Dysplasia (CIN1)   Breast cancer ([place]) 05/2020   Depression    Diabetes ([place]) 2022    Family history of breast cancer 05/30/2020   Family history of pancreatic cancer 05/30/2020   Family history of prostate cancer 05/30/2020   Family history of renal cancer 05/30/2020   Family history of skin cancer 05/30/2020   Gallbladder problem    Heart murmur    cardiologist a long time ago ECHO not completed   Hypertension    Knee pain    Obesity    Other fatigue    Shortness of breath on exertion    Sleep apnea    using c-pap machine   Vitamin D deficiency     SURGICAL HISTORY: Past Surgical History:  Procedure Laterality Date   BREAST LUMPECTOMY WITH RADIOACTIVE SEED AND SENTINEL LYMPH NODE BIOPSY Left 06/29/2020   Procedure: LEFT BREAST LUMPECTOMY WITH RADIOACTIVE SEED;  Surgeon: Jovita Kussmaul, MD;  Location: [place];  Service: General;  Laterality: Left;   CYST REMOVAL TRUNK Left 06/29/2020   Procedure: REMOVAL OF SEBACEOUS CYST;  Surgeon: Megan Salon, MD;  Location: [place];  Service: Gynecology;  Laterality: Left;   HYSTEROSCOPY WITH D & C N/A 06/29/2020   Procedure: DILATATION AND CURETTAGE /HYSTEROSCOPY WITH MYOSURE;  Surgeon: Megan Salon, MD;  Location: [place];  Service: Gynecology;  Laterality: N/A;   laparoscopy cholectomy  2001   SENTINEL NODE BIOPSY Left 06/29/2020   Procedure: LEFT SENTINEL LYMPH NODE  BIOPSY;  Surgeon: Jovita Kussmaul, MD;  Location: [place];  Service: General;  Laterality: Left;    I have reviewed the social history and family history with the patient and they are unchanged from previous note.  ALLERGIES:  is allergic to bactrim [sulfamethoxazole-trimethoprim] and penicillins.  MEDICATIONS:  Current Outpatient Medications  Medication Sig Dispense Refill   exemestane (AROMASIN) 25 MG tablet Take 1 tablet (25 mg total) by mouth daily after breakfast. 30 tablet 2   buPROPion (WELLBUTRIN XL) 300 MG 24 hr tablet Take 1 tablet (300 mg total) by mouth daily. 30 tablet 8   mometasone (ELOCON) 0.1 % ointment Apply topically as directed once or twice  weekly (Patient taking differently: Apply 1 application topically 2 (two) times daily as needed. Apply topically as directed once or twice weekly) 45 g 2   polyethylene glycol (MIRALAX / GLYCOLAX) 17 g packet Take 17 g by mouth daily. (Patient taking differently: Take 17 g by mouth daily as needed for mild constipation.) 90 each 0   valsartan-hydrochlorothiazide (DIOVAN-HCT) 160-12.5 MG tablet Take 1 tablet by mouth daily.     Vitamin D, Ergocalciferol, (DRISDOL) 1.25 MG (50000 UNIT) CAPS capsule Take 1 capsule (50,000 Units total) by mouth every 7 (seven) days. 4 capsule 0   No current facility-administered medications for this visit.    PHYSICAL EXAMINATION: ECOG PERFORMANCE STATUS: 1 - Symptomatic but completely ambulatory  Vitals:   09/14/20 0900  BP: (!) 145/91  Pulse: 72  Resp: 20  Temp: 98 F (36.7 C)  SpO2: 97%   Wt Readings from Last 3 Encounters:  09/14/20 (!) 340 lb (154.2 kg)  08/29/20 (!) 340 lb (154.2 kg)  08/03/20 (!) 345 lb (156.5 kg)     GENERAL:alert, no distress and comfortable SKIN: skin color, texture, turgor are normal, no rashes or significant lesions EYES: normal, Conjunctiva are pink and non-injected, sclera clear Musculoskeletal:no cyanosis of digits and no clubbing  NEURO: alert & oriented x 3 with fluent speech, no focal motor/sensory deficits Breast exam not performed today  LABORATORY DATA:  I have reviewed the data as listed CBC Latest Ref Rng & Units 06/23/2020 04/07/2020  WBC 4.0 - 10.5 K/uL 8.8 9.0  Hemoglobin 12.0 - 15.0 g/dL 13.9 14.5  Hematocrit 36.0 - 46.0 % 43.0 44.6  Platelets 150 - 400 K/uL 295 316     CMP Latest Ref Rng & Units 06/23/2020 04/07/2020  Glucose 70 - 99 mg/dL 127(H) 116(H)  BUN 6 - 20 mg/dL 19 14  Creatinine 0.44 - 1.00 mg/dL 0.83 0.84  Sodium 135 - 145 mmol/L 139 141  Potassium 3.5 - 5.1 mmol/L 3.5 4.0  Chloride 98 - 111 mmol/L 102 101  CO2 22 - 32 mmol/L 29 22  Calcium 8.9 - 10.3 mg/dL 9.0 9.5  Total Protein 6.0 -  8.5 g/dL - 7.0  Total Bilirubin 0.0 - 1.2 mg/dL - 0.6  Alkaline Phos 44 - 121 IU/L - 143(H)  AST 0 - 40 IU/L - 19  ALT 0 - 32 IU/L - 20      RADIOGRAPHIC STUDIES: I have personally reviewed the radiological images as listed and agreed with the findings in the report. No results found.   ASSESSMENT & PLAN:  Anna Martinez is a 60 y.o. female with   1. Malignant neoplasm in left lower outer breast - invasive lobular carcinoma, grade II, ER/PR positive HER2 negative, Ki67 10%, pT2N0M0, stage I -discovered on screening mammogram. Diagnostic mammogram showed 1.9 cm left breast  mass at 5 o'clock. Biopsy on 05/10/20 confirmed invasive lobular carcinoma, ER+/PR+, Her2-. MRI on 05/31/20 showed no additional disease. -she underwent lumpectomy on 06/29/20 by Dr. Marlou Starks. Pathology showed 2.1 cm invasive and in situ lobular carcinoma, margins and lymph nodes negative. Oncotype recurrence score of 15, low risk.  Adjuvant chemotherapy is not recommended. -She proceeded to radiation therapy on 08/18/20 under Dr. Lisbeth Renshaw. She completed treatment earlier this morning.  She has tolerated well with moderate radiation dermatitis. -Given the strong ER and PR expression in her postmenopausal status, we recommend adjuvant endocrine therapy with aromatase inhibitor for a total of 5-10 years to reduce the risk of cancer recurrence.  --The potential benefit and side effects, which includes but not limited to, hot flash, skin and vaginal dryness, metabolic changes ( increased blood glucose, cholesterol, weight, etc.), slightly in increased risk of cardiovascular disease, cataracts, muscular and joint discomfort, osteopenia and osteoporosis, etc, were discussed with her in great details.  -She voiced a good understanding, with some reservation, she finally agreed to try.  Due to her arthritis, will start her on exemestane. -We also discussed the breast cancer surveillance after her surgery. She will continue annual screening  mammogram, self exam, and a routine office visit with lab and exam with [place].   2. Abnormal vaginal bleeding -onset 05/2020, mostly post-coital from ob/gyn notes -endometrium curettage performed during lumpectomy showed benign pathology.   3. Genetics -patient has personal history of sq.cell skin and now breast cancer. Her father had multiple cancers (prostate, kidney, skin, and palate), and she has family history of pancreas cancer and breast cancer in 27 year old cousin.  -testing 05/30/20 was negative   4. HTN, HL, DM, arthritis -per PCP Dr. 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   5. Health maintenance -last pap 02/10/18 negative, endometrium curettage 06/29/20 benign -has not had colonoscopy, her's PCP has referred her to GI. -she is under care of Dr. Jearld Lesch for medical weight management    PLAN: -I called in exemestane to her pharmacy today, she will start in about 3 weeks  -Survivorship was NP [place] in 3 month -lab and f/u in 6 months  -baseline DEXA in the next month     No problem-specific Assessment & Plan notes found for this encounter.   Orders Placed This Encounter  Procedures   DG Bone Density    Standing Status:   Future    Standing Expiration Date:   09/14/2021    Order Specific Question:   Reason for Exam (SYMPTOM  OR DIAGNOSIS REQUIRED)    Answer:   screening    Order Specific Question:   Preferred imaging location?    Answer:   [facility]    All questions were answered. The patient knows to call the clinic with any problems, questions or concerns. No barriers to learning was detected. The total time spent in the appointment was 40 minutes.     Truitt Merle, MD 09/14/2020   I, Wilburn Mylar, am acting as scribe for Truitt Merle, MD.   I have reviewed the above documentation for accuracy and completeness, and I agree with the above.

## 2020-09-26 ENCOUNTER — Ambulatory Visit: Payer: BC Managed Care – PPO | Attending: General Surgery

## 2020-09-26 ENCOUNTER — Other Ambulatory Visit: Payer: Self-pay

## 2020-09-26 VITALS — Wt 340.5 lb

## 2020-09-26 DIAGNOSIS — Z483 Aftercare following surgery for neoplasm: Secondary | ICD-10-CM

## 2020-09-26 NOTE — Therapy (Signed)
[place], [place], [address] Phone: [phone]   Fax:  [phone]  Physical Therapy Treatment  Patient Details  Name: Anna Martinez MRN: [id number] Date of Birth: Apr 28, 1960 Referring Provider (PT): Dr. Marlou Starks   Encounter Date: 09/26/2020   PT End of Session - 09/26/20 0932     Visit Number 2   # unchanged due to screen only   PT Start Time [id number]    PT Stop Time 0931    PT Time Calculation (min) 6 min    Activity Tolerance Patient tolerated treatment well    Behavior During Therapy [facility] for tasks assessed/performed             Past Medical History:  Diagnosis Date   Abnormal Pap smear 08/13/12   LSIL may include HPV or mild Dysplasia (CIN1)   Breast cancer ([place]) 05/2020   Depression    Diabetes ([place]) 2022   Family history of breast cancer 05/30/2020   Family history of pancreatic cancer 05/30/2020   Family history of prostate cancer 05/30/2020   Family history of renal cancer 05/30/2020   Family history of skin cancer 05/30/2020   Gallbladder problem    Heart murmur    cardiologist a long time ago ECHO not completed   Hypertension    Knee pain    Obesity    Other fatigue    Shortness of breath on exertion    Sleep apnea    using c-pap machine   Vitamin D deficiency     Past Surgical History:  Procedure Laterality Date   BREAST LUMPECTOMY WITH RADIOACTIVE SEED AND SENTINEL LYMPH NODE BIOPSY Left 06/29/2020   Procedure: LEFT BREAST LUMPECTOMY WITH RADIOACTIVE SEED;  Surgeon: Jovita Kussmaul, MD;  Location: [place];  Service: General;  Laterality: Left;   CYST REMOVAL TRUNK Left 06/29/2020   Procedure: REMOVAL OF SEBACEOUS CYST;  Surgeon: Megan Salon, MD;  Location: [place];  Service: Gynecology;  Laterality: Left;   HYSTEROSCOPY WITH D & C N/A 06/29/2020   Procedure: DILATATION AND CURETTAGE /HYSTEROSCOPY WITH MYOSURE;  Surgeon: Megan Salon, MD;  Location: [place];  Service: Gynecology;   Laterality: N/A;   laparoscopy cholectomy  2001   SENTINEL NODE BIOPSY Left 06/29/2020   Procedure: LEFT SENTINEL LYMPH NODE BIOPSY;  Surgeon: Jovita Kussmaul, MD;  Location: [place];  Service: General;  Laterality: Left;    Vitals:   09/26/20 0925  Weight: (!) 340 lb 8 oz (154.4 kg)     Subjective Assessment - 09/26/20 0926     Subjective Pt returns for her 3 month L-Dex screen.    Pertinent History Pt was diagnosed in March with a routine mammogram.  She is now surgery on 06/29/20 for left  lumpectomy with SLNB. She also had a vulvar sebaceous cyst and endometrial polyp removed at the same time by Dr. Sabra Heck.                    L-DEX FLOWSHEETS - 09/26/20 0900       L-DEX LYMPHEDEMA SCREENING   Measurement Type Unilateral    L-DEX MEASUREMENT EXTREMITY Upper Extremity    POSITION  Standing    DOMINANT SIDE Right    At Risk Side Left    BASELINE SCORE (UNILATERAL) 3.4    L-DEX SCORE (UNILATERAL) 3.9    VALUE CHANGE (UNILAT) 0.5  PT Long Term Goals - 07/20/20 1304       PT LONG TERM GOAL #1   Title Pt will demonstrate full AROM of left shoulder post surgically    Period Weeks    Status Achieved    Target Date 07/20/20                   Plan - 09/26/20 0933     Clinical Impression Statement Pt returns for her 3 month L-Dex screen. Her change from baseline of 0.5 is WNLs so no further treament is required at this time except to cont every 3 month L-Dex screens which pt is agreeable to.    PT Next Visit Plan Cont every 3 month L-Dex screens for up to 2 years from her SLNB.    Consulted and Agree with Plan of Care Patient             Patient will benefit from skilled therapeutic intervention in order to improve the following deficits and impairments:     Visit Diagnosis: Aftercare following surgery for neoplasm     Problem List Patient Active Problem List   Diagnosis Date Noted    Postmenopausal bleeding    Endometrial polyp    Vulvar cyst    Diabetes mellitus ([place]) 06/22/2020   Genetic testing 06/06/2020   Family history of breast cancer 05/30/2020   Family history of prostate cancer 05/30/2020   Family history of skin cancer 05/30/2020   Family history of pancreatic cancer 05/30/2020   Family history of renal cancer 05/30/2020   Malignant neoplasm of lower-outer quadrant of left breast of female, estrogen receptor positive ([place]) 05/18/2020   Hyperlipidemia 03/24/2020   Mixed hyperlipidemia 03/24/2020   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

## 2020-09-26 NOTE — Progress Notes (Signed)
                                                                                                                                                           Patient Name: Anna Martinez MRN: [id number] DOB: September 02, 1960 Referring Physician: Maurice Small, (Profile Not Attached) Date of Service: 09/14/2020 [facility], [place]                                                        End Of Treatment Note  Diagnoses: C50.512-Malignant neoplasm of lower-outer quadrant of left female breast  Cancer Staging:  Stage IA, pT2N0M0 grade 2, ER/PR positive invasive lobular carcinoma of the left breast  Intent: Curative  Radiation Treatment Dates: 08/18/2020 through 09/14/2020 Site Technique Total Dose (Gy) Dose per Fx (Gy) Completed Fx Beam Energies  Breast, Left: Breast_Lt 3D 42.56/42.56 2.66 16/16 10X  Breast, Left: Breast_Lt_Bst 3D 8/8 2 4/4 6X, 10X   Narrative: The patient tolerated radiation therapy relatively well. She developed fatigue and skin changes in the treatment field. She did not have moist desquamation however at the conclusion of treatment.  Plan: The patient will receive a call in about one month from the radiation oncology department. She will continue follow up with Dr. Burr Medico as well.   ________________________________________________    Carola Rhine, [facility]

## 2020-09-27 ENCOUNTER — Ambulatory Visit (INDEPENDENT_AMBULATORY_CARE_PROVIDER_SITE_OTHER): Payer: BC Managed Care – PPO | Admitting: Family Medicine

## 2020-09-27 ENCOUNTER — Telehealth: Payer: Self-pay | Admitting: *Deleted

## 2020-09-27 NOTE — Telephone Encounter (Signed)
Dr.Beavers,  This patient is for a direct screening colonoscopy. She has recently been treated for breast cancer. Her last BMI=50.21 on 09/14/20. Okay for direct colonoscopy at hospital or OV first? Please advise. Thank you, Griff Badley pv

## 2020-09-28 ENCOUNTER — Other Ambulatory Visit: Payer: Self-pay

## 2020-09-28 ENCOUNTER — Ambulatory Visit (AMBULATORY_SURGERY_CENTER): Payer: Self-pay | Admitting: *Deleted

## 2020-09-28 VITALS — Ht 69.0 in | Wt 340.0 lb

## 2020-09-28 DIAGNOSIS — Z1211 Encounter for screening for malignant neoplasm of colon: Secondary | ICD-10-CM

## 2020-09-28 NOTE — Telephone Encounter (Signed)
Colon scheduled for November 10th at 8:45 with Dr. Tarri Glenn. Case # [id number].

## 2020-09-28 NOTE — Telephone Encounter (Signed)
Anna Martinez,  Please schedule patient for hospital colonoscopy her PV is TODAY. Thank you!

## 2020-09-28 NOTE — Progress Notes (Signed)
Patient's pre-visit was done today over the phone with the patient due to COVID-19 pandemic. Name,DOB and address verified. Insurance verified. Patient denies any allergies to Eggs and Soy. Patient denies any problems with anesthesia/sedation. Patient denies taking diet pills or blood thinners. No home Oxygen.  EMMI education assigned to the patient for the procedure, sent to [facility].  The patient is COVID-19 vaccinated. I explained our policy for pt's with BMI over 50. Patient request to come in to get weigh because she wants to have procedure at [facility] not hospital. Patient will come in on Monday at 11 am.

## 2020-09-29 ENCOUNTER — Encounter (HOSPITAL_BASED_OUTPATIENT_CLINIC_OR_DEPARTMENT_OTHER): Payer: Self-pay | Admitting: Orthopedic Surgery

## 2020-09-29 ENCOUNTER — Encounter: Payer: Self-pay | Admitting: Physician Assistant

## 2020-09-29 ENCOUNTER — Other Ambulatory Visit: Payer: Self-pay

## 2020-09-29 DIAGNOSIS — S83281D Other tear of lateral meniscus, current injury, right knee, subsequent encounter: Secondary | ICD-10-CM | POA: Insufficient documentation

## 2020-09-29 NOTE — H&P (Signed)
Anna Martinez is an 60 y.o. female.   Chief Complaint: right knee pain HPI: Anna Martinez is a 60 year-old seen for recurrent right knee pain and bilateral calf pain.  She underwent an MRI of the right knee that has shown a lateral meniscus tear with moderate chondromalacia and a very small Bakers cyst.  She underwent bilateral ultrasound evaluations to rule out DVTs and these were negative, even though she has bilateral calf pain.    Past Medical History:  Diagnosis Date   Abnormal Pap smear 08/13/2012   LSIL may include HPV or mild Dysplasia (CIN1)   Acute lateral meniscus tear of right knee, subsequent encounter    Breast cancer ([place]) 05/2020   Depression    Family history of breast cancer 05/30/2020   Family history of pancreatic cancer 05/30/2020   Family history of prostate cancer 05/30/2020   Family history of renal cancer 05/30/2020   Family history of skin cancer 05/30/2020   Gallbladder problem    Heart murmur    cardiologist a long time ago ECHO not completed   Hypertension    Knee pain    Lateral meniscus tear    right knee   Obesity    Other fatigue    Pre-diabetes    Shortness of breath on exertion    Sleep apnea    using c-pap machine   Vitamin D deficiency     Past Surgical History:  Procedure Laterality Date   BREAST LUMPECTOMY WITH RADIOACTIVE SEED AND SENTINEL LYMPH NODE BIOPSY Left 06/29/2020   Procedure: LEFT BREAST LUMPECTOMY WITH RADIOACTIVE SEED;  Surgeon: Jovita Kussmaul, MD;  Location: [place];  Service: General;  Laterality: Left;   CHOLECYSTECTOMY     CYST REMOVAL TRUNK Left 06/29/2020   Procedure: REMOVAL OF SEBACEOUS CYST;  Surgeon: Megan Salon, MD;  Location: [place];  Service: Gynecology;  Laterality: Left;   HYSTEROSCOPY WITH D & C N/A 06/29/2020   Procedure: DILATATION AND CURETTAGE /HYSTEROSCOPY WITH MYOSURE;  Surgeon: Megan Salon, MD;  Location: [place];  Service: Gynecology;  Laterality: N/A;   laparoscopy cholectomy  02/06/1999   SENTINEL  NODE BIOPSY Left 06/29/2020   Procedure: LEFT SENTINEL LYMPH NODE BIOPSY;  Surgeon: Jovita Kussmaul, MD;  Location: [place];  Service: General;  Laterality: Left;    Family History  Problem Relation Age of Onset   Hypertension Mother    Hyperlipidemia Mother    Heart disease Mother    Obesity Mother    Skin cancer Mother    Hypertension Father    Obesity Father    Prostate cancer Father        dx 21s   Renal cancer Father        dx 70s   Cancer Father        mouth; dx after 55   Skin cancer Father        dx after 63   Melanoma Maternal Uncle        mets; dx 94s   Pancreatic cancer Paternal Aunt        dx 67s   Cancer Paternal Uncle        unknown type; dx unknown age   Skin cancer Maternal Grandmother        face   Cancer Paternal Grandmother        unknown type; ? pancreatic; dx after 2   Breast cancer Cousin        dx 87s; paternal cousin  Colon cancer Neg Hx    Colon polyps Neg Hx    Esophageal cancer Neg Hx    Rectal cancer Neg Hx    Stomach cancer Neg Hx    Social History:  reports that she has never smoked. She has never used smokeless tobacco. She reports that she does not drink alcohol and does not use drugs.  Allergies:  Allergies  Allergen Reactions   Bactrim [Sulfamethoxazole-Trimethoprim] Itching and Rash   Penicillins     ? Reaction as baby   Sulfa Antibiotics Rash    No medications prior to admission.    No results found for this or any previous visit (from the past 48 hour(s)). No results found.  Review of Systems  Constitutional:  Positive for activity change.  HENT: Negative.    Eyes: Negative.   Respiratory: Negative.    Cardiovascular:  Positive for leg swelling.  Gastrointestinal: Negative.   Endocrine: Negative.   Genitourinary: Negative.   Musculoskeletal:  Positive for arthralgias, back pain, gait problem and joint swelling.  Allergic/Immunologic: Negative.   Hematological: Negative.   Psychiatric/Behavioral: Negative.      Height '5\' 9"'$  (1.753 m), weight (!) 154.2 kg, last menstrual period 07/21/2015. Physical Exam Constitutional:      Appearance: She is obese.  HENT:     Head: Normocephalic and atraumatic.     Right Ear: External ear normal.     Left Ear: External ear normal.     Nose: Nose normal.     Mouth/Throat:     Mouth: Mucous membranes are moist.  Eyes:     Conjunctiva/sclera: Conjunctivae normal.  Cardiovascular:     Rate and Rhythm: Normal rate.     Pulses: Normal pulses.  Pulmonary:     Effort: Pulmonary effort is normal.  Abdominal:     Palpations: Abdomen is soft.  Musculoskeletal:     Cervical back: Neck supple.     Comments: Examination of her right knee reveals pain posterolaterally.  Positive lateral McMurray.  1+ effusion.  Range of motion 0-120 degrees.  Knee is stable.  Examination of bilateral lower extremities reveals tenderness in both calf areas, but minimal swelling.    Skin:    General: Skin is warm.     Capillary Refill: Capillary refill takes less than 2 seconds.  Neurological:     General: No focal deficit present.     Mental Status: She is alert.  Psychiatric:        Mood and Affect: Mood normal.     Assessment Principal Problem:   Acute lateral meniscus tear of right knee, subsequent encounter Active Problems:   Class 3 severe obesity with serious comorbidity and body mass index (BMI) of 50.0 to 59.9 in adult [facility])   Essential hypertension, benign   Sleep apnea   Moderate recurrent major depression (HCC)   Malignant neoplasm of lower-outer quadrant of left breast of female, estrogen receptor positive ([place])   Diabetes mellitus (HCC)   Postmenopausal bleeding   Plan Due to her persistent pain in her knee and lack of response to conservative care, would recommend a right knee arthroscopy with partial lateral meniscectomy.  Risks, complications and benefits of the surgery have been described to her in detail and she understands this completely.    Skylar Flynt J  Jackalyn Haith, PA-C 09/29/2020, 7:50 PM

## 2020-09-30 ENCOUNTER — Encounter (HOSPITAL_BASED_OUTPATIENT_CLINIC_OR_DEPARTMENT_OTHER)
Admission: RE | Admit: 2020-09-30 | Discharge: 2020-09-30 | Disposition: A | Discharge status: Home / Self Care | Payer: BC Managed Care – PPO | Source: Ambulatory Visit | Attending: Orthopedic Surgery | Admitting: Orthopedic Surgery

## 2020-09-30 DIAGNOSIS — Z853 Personal history of malignant neoplasm of breast: Secondary | ICD-10-CM | POA: Diagnosis not present

## 2020-09-30 DIAGNOSIS — Z882 Allergy status to sulfonamides status: Secondary | ICD-10-CM | POA: Diagnosis not present

## 2020-09-30 DIAGNOSIS — M94261 Chondromalacia, right knee: Secondary | ICD-10-CM | POA: Diagnosis not present

## 2020-09-30 DIAGNOSIS — Z79899 Other long term (current) drug therapy: Secondary | ICD-10-CM | POA: Diagnosis not present

## 2020-09-30 DIAGNOSIS — M2241 Chondromalacia patellae, right knee: Secondary | ICD-10-CM | POA: Diagnosis not present

## 2020-09-30 DIAGNOSIS — S83281A Other tear of lateral meniscus, current injury, right knee, initial encounter: Secondary | ICD-10-CM | POA: Diagnosis present

## 2020-09-30 DIAGNOSIS — M7121 Synovial cyst of popliteal space [Baker], right knee: Secondary | ICD-10-CM | POA: Diagnosis not present

## 2020-09-30 DIAGNOSIS — X58XXXA Exposure to other specified factors, initial encounter: Secondary | ICD-10-CM | POA: Diagnosis not present

## 2020-09-30 LAB — BASIC METABOLIC PANEL
Anion gap: 9 (ref 5–15)
BUN: 14 mg/dL (ref 8–23)
CO2: 27 mmol/L (ref 22–32)
Calcium: 8.9 mg/dL (ref 8.9–10.3)
Chloride: 102 mmol/L (ref 98–111)
Creatinine, Ser: 0.74 mg/dL (ref 0.44–1.00)
GFR, Estimated: >60 mL/min (ref >60)
Glucose, Bld: 108 mg/dL — ABNORMAL HIGH (ref 70–99)
Potassium: 3.9 mmol/L (ref 3.5–5.1)
Sodium: 138 mmol/L (ref 135–145)

## 2020-10-02 ENCOUNTER — Other Ambulatory Visit (INDEPENDENT_AMBULATORY_CARE_PROVIDER_SITE_OTHER): Payer: Self-pay | Admitting: Family Medicine

## 2020-10-02 DIAGNOSIS — E559 Vitamin D deficiency, unspecified: Secondary | ICD-10-CM

## 2020-10-02 NOTE — Anesthesia Preprocedure Evaluation (Addendum)
Anesthesia Evaluation  Patient identified by MRN, date of birth, ID band Patient awake    Reviewed: Allergy & Precautions, NPO status , Patient's Chart, lab work & pertinent test results  Airway Mallampati: III  TM Distance: >3 FB Neck ROM: Full    Dental no notable dental hx. (+) Teeth Intact, Dental Advisory Given   Pulmonary sleep apnea and Continuous Positive Airway Pressure Ventilation ,    Pulmonary exam normal breath sounds clear to auscultation       Cardiovascular hypertension, Pt. on medications Normal cardiovascular exam Rhythm:Regular Rate:Normal     Neuro/Psych    GI/Hepatic negative GI ROS, Neg liver ROS,   Endo/Other  Morbid obesity  Renal/GU      Musculoskeletal   Abdominal (+) + obese (BMI 50.33),   Peds  Hematology   Anesthesia Other Findings ALL Bactrim, PCN  Breast CA  Reproductive/Obstetrics                           Anesthesia Physical Anesthesia Plan  ASA: 3  Anesthesia Plan: General   Post-op Pain Management:  Regional for Post-op pain   Induction: Intravenous  PONV Risk Score and Plan: Treatment may vary due to age or medical condition, Midazolam and Ondansetron  Airway Management Planned: LMA  Additional Equipment: None  Intra-op Plan:   Post-operative Plan:   Informed Consent:     Dental advisory given  Plan Discussed with:   Anesthesia Plan Comments: (GA w Field Knee Block)        Anesthesia Quick Evaluation

## 2020-10-03 ENCOUNTER — Other Ambulatory Visit: Payer: Self-pay

## 2020-10-03 ENCOUNTER — Ambulatory Visit (HOSPITAL_BASED_OUTPATIENT_CLINIC_OR_DEPARTMENT_OTHER)
Admission: RE | Admit: 2020-10-03 | Discharge: 2020-10-03 | Disposition: A | Discharge status: Home / Self Care | Payer: BC Managed Care – PPO | Attending: Orthopedic Surgery | Admitting: Orthopedic Surgery

## 2020-10-03 ENCOUNTER — Encounter (HOSPITAL_BASED_OUTPATIENT_CLINIC_OR_DEPARTMENT_OTHER)
Admission: RE | Disposition: A | Discharge status: Home / Self Care | Payer: Self-pay | Source: Home / Self Care | Attending: Orthopedic Surgery

## 2020-10-03 ENCOUNTER — Encounter (INDEPENDENT_AMBULATORY_CARE_PROVIDER_SITE_OTHER): Payer: Self-pay | Admitting: Emergency Medicine

## 2020-10-03 ENCOUNTER — Ambulatory Visit (HOSPITAL_BASED_OUTPATIENT_CLINIC_OR_DEPARTMENT_OTHER): Payer: BC Managed Care – PPO | Admitting: Anesthesiology

## 2020-10-03 ENCOUNTER — Encounter (HOSPITAL_BASED_OUTPATIENT_CLINIC_OR_DEPARTMENT_OTHER): Payer: Self-pay | Admitting: Orthopedic Surgery

## 2020-10-03 DIAGNOSIS — M7121 Synovial cyst of popliteal space [Baker], right knee: Secondary | ICD-10-CM | POA: Insufficient documentation

## 2020-10-03 DIAGNOSIS — G4730 Sleep apnea, unspecified: Secondary | ICD-10-CM | POA: Diagnosis present

## 2020-10-03 DIAGNOSIS — X58XXXA Exposure to other specified factors, initial encounter: Secondary | ICD-10-CM | POA: Insufficient documentation

## 2020-10-03 DIAGNOSIS — Z882 Allergy status to sulfonamides status: Secondary | ICD-10-CM | POA: Insufficient documentation

## 2020-10-03 DIAGNOSIS — C50512 Malignant neoplasm of lower-outer quadrant of left female breast: Secondary | ICD-10-CM

## 2020-10-03 DIAGNOSIS — F331 Major depressive disorder, recurrent, moderate: Secondary | ICD-10-CM | POA: Diagnosis present

## 2020-10-03 DIAGNOSIS — I10 Essential (primary) hypertension: Secondary | ICD-10-CM | POA: Diagnosis present

## 2020-10-03 DIAGNOSIS — S83281A Other tear of lateral meniscus, current injury, right knee, initial encounter: Secondary | ICD-10-CM | POA: Insufficient documentation

## 2020-10-03 DIAGNOSIS — Z853 Personal history of malignant neoplasm of breast: Secondary | ICD-10-CM | POA: Insufficient documentation

## 2020-10-03 DIAGNOSIS — N950 Postmenopausal bleeding: Secondary | ICD-10-CM | POA: Diagnosis present

## 2020-10-03 DIAGNOSIS — Z79899 Other long term (current) drug therapy: Secondary | ICD-10-CM | POA: Insufficient documentation

## 2020-10-03 DIAGNOSIS — E119 Type 2 diabetes mellitus without complications: Secondary | ICD-10-CM

## 2020-10-03 DIAGNOSIS — S83281D Other tear of lateral meniscus, current injury, right knee, subsequent encounter: Secondary | ICD-10-CM

## 2020-10-03 DIAGNOSIS — M94261 Chondromalacia, right knee: Secondary | ICD-10-CM | POA: Insufficient documentation

## 2020-10-03 DIAGNOSIS — Z6841 Body Mass Index (BMI) 40.0 and over, adult: Secondary | ICD-10-CM

## 2020-10-03 DIAGNOSIS — M2241 Chondromalacia patellae, right knee: Secondary | ICD-10-CM | POA: Insufficient documentation

## 2020-10-03 DIAGNOSIS — Z170 Estrogen receptor positive status [ER+]: Secondary | ICD-10-CM

## 2020-10-03 HISTORY — DX: Other tear of lateral meniscus, current injury, unspecified knee, initial encounter: S83.289A

## 2020-10-03 HISTORY — DX: Other tear of lateral meniscus, current injury, right knee, subsequent encounter: S83.281D

## 2020-10-03 HISTORY — DX: Prediabetes: R73.03

## 2020-10-03 HISTORY — PX: KNEE ARTHROSCOPY WITH LATERAL MENISECTOMY: SHX6193

## 2020-10-03 SURGERY — ARTHROSCOPY, KNEE, WITH LATERAL MENISCECTOMY
Anesthesia: General | Site: Knee | Laterality: Right

## 2020-10-03 MED ORDER — EPINEPHRINE PF 1 MG/ML IJ SOLN
INTRAMUSCULAR | Status: AC
  Filled 2020-10-03: qty 1

## 2020-10-03 MED ORDER — PROPOFOL 10 MG/ML IV BOLUS
INTRAVENOUS | Status: AC
  Filled 2020-10-03: qty 20

## 2020-10-03 MED ORDER — FENTANYL CITRATE (PF) 100 MCG/2ML IJ SOLN
100.0000 ug | Freq: Once | INTRAMUSCULAR | Status: AC
  Administered 2020-10-03: 100 ug via INTRAVENOUS

## 2020-10-03 MED ORDER — PROMETHAZINE HCL 12.5 MG PO TABS: 12.5000 mg | ORAL_TABLET | Freq: Three times a day (TID) | ORAL | 0 refills | Status: DC | PRN

## 2020-10-03 MED ORDER — HYDROMORPHONE HCL 1 MG/ML IJ SOLN: 0.2500 mg | INTRAMUSCULAR | Status: DC | PRN

## 2020-10-03 MED ORDER — PROPOFOL 10 MG/ML IV BOLUS
INTRAVENOUS | Status: DC | PRN
  Administered 2020-10-03: 50 mg via INTRAVENOUS
  Administered 2020-10-03: 150 mg via INTRAVENOUS

## 2020-10-03 MED ORDER — MIDAZOLAM HCL 2 MG/2ML IJ SOLN
INTRAMUSCULAR | Status: AC
  Filled 2020-10-03: qty 2

## 2020-10-03 MED ORDER — CEFAZOLIN SODIUM-DEXTROSE 2-3 GM-%(50ML) IV SOLR
INTRAVENOUS | Status: DC | PRN
  Administered 2020-10-03: 3 g via INTRAVENOUS

## 2020-10-03 MED ORDER — BUPIVACAINE-EPINEPHRINE (PF) 0.25% -1:200000 IJ SOLN
INTRAMUSCULAR | Status: DC | PRN
  Administered 2020-10-03: 10 mL

## 2020-10-03 MED ORDER — OXYCODONE HCL 5 MG/5ML PO SOLN: 5.0000 mg | Freq: Once | ORAL | Status: DC | PRN

## 2020-10-03 MED ORDER — POVIDONE-IODINE 7.5 % EX SOLN: Freq: Once | CUTANEOUS | Status: DC

## 2020-10-03 MED ORDER — LACTATED RINGERS IV SOLN: INTRAVENOUS | Status: DC | PRN

## 2020-10-03 MED ORDER — METHYLPREDNISOLONE ACETATE 80 MG/ML IJ SUSP
INTRAMUSCULAR | Status: AC
  Filled 2020-10-03: qty 1

## 2020-10-03 MED ORDER — MIDAZOLAM HCL 2 MG/2ML IJ SOLN
2.0000 mg | Freq: Once | INTRAMUSCULAR | Status: AC
  Administered 2020-10-03: 2 mg via INTRAVENOUS

## 2020-10-03 MED ORDER — ONDANSETRON HCL 4 MG/2ML IJ SOLN: 4.0000 mg | Freq: Once | INTRAMUSCULAR | Status: DC | PRN

## 2020-10-03 MED ORDER — ONDANSETRON HCL 4 MG/2ML IJ SOLN
INTRAMUSCULAR | Status: AC
  Filled 2020-10-03: qty 2

## 2020-10-03 MED ORDER — FENTANYL CITRATE (PF) 100 MCG/2ML IJ SOLN
INTRAMUSCULAR | Status: AC
  Filled 2020-10-03: qty 2

## 2020-10-03 MED ORDER — BUPIVACAINE-EPINEPHRINE (PF) 0.5% -1:200000 IJ SOLN
INTRAMUSCULAR | Status: DC | PRN
  Administered 2020-10-03: 30 mL

## 2020-10-03 MED ORDER — DEXAMETHASONE SODIUM PHOSPHATE 10 MG/ML IJ SOLN: 8.0000 mg | Freq: Once | INTRAMUSCULAR | Status: DC

## 2020-10-03 MED ORDER — LACTATED RINGERS IV SOLN: INTRAVENOUS | Status: DC

## 2020-10-03 MED ORDER — CEFAZOLIN IN SODIUM CHLORIDE 3-0.9 GM/100ML-% IV SOLN
3.0000 g | INTRAVENOUS | Status: DC
Start: 2020-10-03 — End: 2020-10-03

## 2020-10-03 MED ORDER — DEXAMETHASONE SODIUM PHOSPHATE 10 MG/ML IJ SOLN
INTRAMUSCULAR | Status: AC
  Filled 2020-10-03: qty 1

## 2020-10-03 MED ORDER — METHYLPREDNISOLONE ACETATE 80 MG/ML IJ SUSP
INTRAMUSCULAR | Status: DC | PRN
  Administered 2020-10-03: 80 mg via INTRA_ARTICULAR

## 2020-10-03 MED ORDER — DEXMEDETOMIDINE HCL IN NACL 400 MCG/100ML IV SOLN
INTRAVENOUS | Status: DC | PRN
  Administered 2020-10-03: 16 ug via INTRAVENOUS

## 2020-10-03 MED ORDER — HYDROCODONE-ACETAMINOPHEN 5-325 MG PO TABS: 1.0000 | ORAL_TABLET | ORAL | 0 refills | Status: DC | PRN

## 2020-10-03 MED ORDER — SODIUM CHLORIDE 0.9 % IR SOLN: Status: DC | PRN

## 2020-10-03 MED ORDER — AMISULPRIDE (ANTIEMETIC) 5 MG/2ML IV SOLN: 10.0000 mg | Freq: Once | INTRAVENOUS | Status: DC | PRN

## 2020-10-03 MED ORDER — APIXABAN 2.5 MG PO TABS: 2.5000 mg | ORAL_TABLET | Freq: Two times a day (BID) | ORAL | 0 refills | Status: DC

## 2020-10-03 MED ORDER — ONDANSETRON HCL 4 MG/2ML IJ SOLN
INTRAMUSCULAR | Status: DC | PRN
  Administered 2020-10-03: 4 mg via INTRAVENOUS

## 2020-10-03 MED ORDER — POVIDONE-IODINE 10 % EX SWAB
2.0000 | Freq: Once | CUTANEOUS | Status: DC
Start: 2020-10-03 — End: 2020-10-03

## 2020-10-03 MED ORDER — ACETAMINOPHEN 10 MG/ML IV SOLN: 1000.0000 mg | Freq: Once | INTRAVENOUS | Status: DC | PRN

## 2020-10-03 MED ORDER — LIDOCAINE HCL (CARDIAC) PF 100 MG/5ML IV SOSY
PREFILLED_SYRINGE | INTRAVENOUS | Status: DC | PRN
  Administered 2020-10-03: 100 mg via INTRAVENOUS

## 2020-10-03 MED ORDER — LIDOCAINE HCL (PF) 2 % IJ SOLN
INTRAMUSCULAR | Status: AC
  Filled 2020-10-03: qty 5

## 2020-10-03 MED ORDER — OXYCODONE HCL 5 MG PO TABS: 5.0000 mg | ORAL_TABLET | Freq: Once | ORAL | Status: DC | PRN

## 2020-10-03 MED ORDER — DEXAMETHASONE SODIUM PHOSPHATE 10 MG/ML IJ SOLN
INTRAMUSCULAR | Status: DC | PRN
  Administered 2020-10-03: 5 mg via INTRAVENOUS

## 2020-10-03 MED ORDER — CEFAZOLIN SODIUM-DEXTROSE 2-4 GM/100ML-% IV SOLN
INTRAVENOUS | Status: AC
  Filled 2020-10-03: qty 100

## 2020-10-03 MED ORDER — CEFAZOLIN IN SODIUM CHLORIDE 3-0.9 GM/100ML-% IV SOLN
INTRAVENOUS | Status: AC
  Filled 2020-10-03: qty 100

## 2020-10-03 SURGICAL SUPPLY — 44 items
BLADE EXCALIBUR 4.0X13 (MISCELLANEOUS) IMPLANT
BLADE SURG 15 STRL LF DISP TIS (BLADE) ×2 IMPLANT
BLADE SURG 15 STRL SS (BLADE) ×3
BNDG COHESIVE 4X5 TAN ST LF (GAUZE/BANDAGES/DRESSINGS) IMPLANT
BNDG ELASTIC 6X5.8 VLCR STR LF (GAUZE/BANDAGES/DRESSINGS) ×3 IMPLANT
DISSECTOR  3.8MM X 13CM (MISCELLANEOUS) ×3
DISSECTOR 3.8MM X 13CM (MISCELLANEOUS) ×2 IMPLANT
DISSECTOR 4.0MM X 13CM (MISCELLANEOUS) IMPLANT
DRAPE ARTHROSCOPY W/POUCH 90 (DRAPES) ×3 IMPLANT
DURAPREP 26ML APPLICATOR (WOUND CARE) ×3 IMPLANT
EXCALIBUR 3.8MM X 13CM (MISCELLANEOUS) IMPLANT
GAUZE SPONGE 4X4 12PLY STRL (GAUZE/BANDAGES/DRESSINGS) ×3 IMPLANT
GAUZE XEROFORM 1X8 LF (GAUZE/BANDAGES/DRESSINGS) ×3 IMPLANT
GLOVE SURG ENC MOIS LTX SZ7 (GLOVE) ×3 IMPLANT
GLOVE SURG MICRO LTX SZ7.5 (GLOVE) ×3 IMPLANT
GLOVE SURG UNDER POLY LF SZ7 (GLOVE) ×3 IMPLANT
GLOVE SURG UNDER POLY LF SZ7.5 (GLOVE) ×3 IMPLANT
GOWN STRL REUS W/ TWL LRG LVL3 (GOWN DISPOSABLE) ×4 IMPLANT
GOWN STRL REUS W/ TWL XL LVL3 (GOWN DISPOSABLE) ×2 IMPLANT
GOWN STRL REUS W/TWL 2XL LVL3 (GOWN DISPOSABLE) ×3 IMPLANT
GOWN STRL REUS W/TWL LRG LVL3 (GOWN DISPOSABLE) ×6
GOWN STRL REUS W/TWL XL LVL3 (GOWN DISPOSABLE) ×3
HOLDER KNEE FOAM BLUE (MISCELLANEOUS) IMPLANT
MANIFOLD NEPTUNE II (INSTRUMENTS) ×3 IMPLANT
NDL SAFETY ECLIPSE 18X1.5 (NEEDLE) ×4 IMPLANT
NEEDLE HYPO 18GX1.5 SHARP (NEEDLE) ×6
NEEDLE HYPO 22GX1.5 SAFETY (NEEDLE) IMPLANT
PACK ARTHROSCOPY DSU (CUSTOM PROCEDURE TRAY) ×3 IMPLANT
PACK BASIN DAY SURGERY FS (CUSTOM PROCEDURE TRAY) ×3 IMPLANT
PAD ALCOHOL SWAB (MISCELLANEOUS) IMPLANT
PORT APPOLLO RF 90DEGREE MULTI (SURGICAL WAND) IMPLANT
SUCTION FRAZIER HANDLE 10FR (MISCELLANEOUS)
SUCTION TUBE FRAZIER 10FR DISP (MISCELLANEOUS) IMPLANT
SUT ETHILON 3 0 PS 1 (SUTURE) ×3 IMPLANT
SUT ETHILON 4 0 PS 2 18 (SUTURE) ×3 IMPLANT
SUT PROLENE 3 0 PS 2 (SUTURE) IMPLANT
SUT VIC AB 3-0 PS1 18 (SUTURE)
SUT VIC AB 3-0 PS1 18XBRD (SUTURE) IMPLANT
SYR 20ML LL LF (SYRINGE) IMPLANT
SYR 5ML LL (SYRINGE) ×3 IMPLANT
TOWEL GREEN STERILE FF (TOWEL DISPOSABLE) ×3 IMPLANT
TUBING ARTHROSCOPY IRRIG 16FT (MISCELLANEOUS) ×3 IMPLANT
WATER STERILE IRR 1000ML POUR (IV SOLUTION) ×3 IMPLANT
WRAP KNEE MAXI GEL POST OP (GAUZE/BANDAGES/DRESSINGS) IMPLANT

## 2020-10-03 NOTE — Op Note (Signed)
NAMELUNAFREYA, REDEKER MEDICAL RECORD NO: [id number] ACCOUNT NO: [id number] DATE OF BIRTH: 1960-11-08 FACILITY: [facility] LOCATION: [facility] PHYSICIAN: Audree Camel. Noemi Chapel, MD  Operative Report   DATE OF PROCEDURE: 10/03/2020  PREOPERATIVE DIAGNOSES:  1.  Right knee acute traumatic lateral meniscus tear. 2.  Right knee chondromalacia.  POSTOPERATIVE DIAGNOSES: 1.  Right knee acute traumatic lateral meniscus tear. 2.  Right knee chondromalacia.  PROCEDURES: 1.  Right knee examination under anesthesia followed by arthroscopic partial lateral meniscectomy. 2.  Right knee patellofemoral chondroplasty.  SURGEON:  Audree Camel. Noemi Chapel, MD  ASSISTANT:  Matthew Saras, PA  ANESTHESIA:  General.  OPERATIVE TIME: 30 minutes.  COMPLICATIONS:  None.  INDICATIONS FOR PROCEDURE:  The patient is a 60 year old who has had painful twisting injuries to her right knee over the past 3-4 months with exam, x-ray, and MRI documenting lateral meniscus tear with chondromalacia.  She has failed conservative care  and is now to undergo arthroscopy.  DESCRIPTION OF PROCEDURE:  The patient was brought to the operating room on 10/03/2020 after knee block was placed in the holding room by anesthesia.  She was placed on the operating table in supine position.  She received antibiotics perioperatively for  prophylaxis.  After being placed under general anesthesia, right knee was examined.  Range of motion 0-120 degrees.  Knee was stable to ligamentous exam with normal patellar tracking.  The right leg was prepped using sterile DuraPrep and draped using  sterile technique.  Timeout procedure was called, and the correct right knee was identified. Initially, through an anterolateral portal, the arthroscope with pump attached was placed in through an anteromedial portal. An arthroscopic probe was placed. On  initial inspection of the medial compartment, she had grade 1 and 2 chondromalacia.  Medial meniscus was intact.   Intercondylar notch was inspected.  Anterior and posterior cruciate ligaments were normal.  Lateral compartment was inspected.  She had 30%  to 40% grade 3 chondromalacia, which was debrided.  Lateral meniscus showed a tear 30% to 40% in the posterior and lateral horn, which was resected back to a stable rim.  Patellofemoral joint showed 30% to 40% grade 3 chondromalacia, which was debrided.   The patella tracked normally.  Medial and lateral gutter showed significant synovitis, which was debrided.  Otherwise, this was free of pathology. At this point, it was felt that all pathology had been satisfactorily addressed.  Instruments were  removed.  Portals were closed with 3-0 nylon suture, and the knee injected with 80 mg of Depo-Medrol and 10 mL of Marcaine with epinephrine.  Sterile dressings were applied, and the patient was awakened and taken to recovery room in stable condition.  FOLLOWUP CARE:  The patient will be followed as an outpatient on hydrocodone and Eliquis.  I will see her back in office in a week for sutures out and followup.   ROH D: 10/03/2020 1:05:51 pm T: 10/03/2020 1:28:00 pm  JOB: [id number]

## 2020-10-03 NOTE — Anesthesia Procedure Notes (Signed)
Procedure Name: LMA Insertion Date/Time: 10/03/2020 11:02 AM Performed by: Verita Lamb, CRNA Pre-anesthesia Checklist: Patient identified, Emergency Drugs available, Suction available and Patient being monitored Patient Re-evaluated:Patient Re-evaluated prior to induction Oxygen Delivery Method: Circle system utilized Preoxygenation: Pre-oxygenation with 100% oxygen Induction Type: IV induction Ventilation: Mask ventilation without difficulty LMA: LMA with gastric port inserted LMA Size: 4.0 Number of attempts: 1 Airway Equipment and Method: Bite block Placement Confirmation: positive ETCO2, CO2 detector and breath sounds checked- equal and bilateral Tube secured with: Tape Dental Injury: Teeth and Oropharynx as per pre-operative assessment

## 2020-10-03 NOTE — Progress Notes (Signed)
Assisted Dr. Valma Cava with right knee block. Side rails up, monitors on throughout procedure. See vital signs in flow sheet. Tolerated Procedure well.

## 2020-10-03 NOTE — Anesthesia Postprocedure Evaluation (Signed)
Anesthesia Post Note  Patient: Anna Martinez  Procedure(s) Performed: KNEE ARTHROSCOPY WITH LATERAL MENISECTOMY (Right: Knee)     Patient location during evaluation: PACU Anesthesia Type: General Level of consciousness: awake and alert Pain management: pain level controlled Vital Signs Assessment: post-procedure vital signs reviewed and stable Respiratory status: spontaneous breathing, nonlabored ventilation, respiratory function stable and patient connected to nasal cannula oxygen Cardiovascular status: blood pressure returned to baseline and stable Postop Assessment: no apparent nausea or vomiting Anesthetic complications: no   No notable events documented.  Last Vitals:  Vitals:   10/03/20 1145 10/03/20 1152  BP: 117/71 (!) 158/77  Pulse: (!) 53   Resp: (!) 22   Temp:    SpO2: 95%     Last Pain:  Vitals:   10/03/20 1152  TempSrc:   PainSc: 0-No pain                 Barnet Glasgow

## 2020-10-03 NOTE — Anesthesia Procedure Notes (Signed)
Anesthesia Regional Block: Knee block   Pre-Anesthetic Checklist: ,,,,, at surgeon's request  Laterality: Right and Lower  Prep: alcohol swabs       Needles:       Needle Length: 4cm  Needle Gauge: 21     Additional Needles:   Narrative:  Start time: 10/03/2020 10:02 AM End time: 10/03/2020 10:07 AM  Performed by: Personally  Anesthesiologist: Barnet Glasgow, MD

## 2020-10-03 NOTE — Discharge Instructions (Addendum)
Regional Anesthesia Blocks  1. Numbness or the inability to move the "blocked" extremity may last from 3-48 hours after placement. The length of time depends on the medication injected and your individual response to the medication. If the numbness is not going away after 48 hours, call your surgeon.  2. The extremity that is blocked will need to be protected until the numbness is gone and the  Strength has returned. Because you cannot feel it, you will need to take extra care to avoid injury. Because it may be weak, you may have difficulty moving it or using it. You may not know what position it is in without looking at it while the block is in effect.  3. For blocks in the legs and feet, returning to weight bearing and walking needs to be done carefully. You will need to wait until the numbness is entirely gone and the strength has returned. You should be able to move your leg and foot normally before you try and bear weight or walk. You will need someone to be with you when you first try to ensure you do not fall and possibly risk injury.  4. Bruising and tenderness at the needle site are common side effects and will resolve in a few days.  5. Persistent numbness or new problems with movement should be communicated to the surgeon or the [place] [phone] [place] [phone]).  Post Anesthesia Home Care Instructions  Activity: Get plenty of rest for the remainder of the day. A responsible individual must stay with you for 24 hours following the procedure.  For the next 24 hours, DO NOT: -Drive a car -Paediatric nurse -Drink alcoholic beverages -Take any medication unless instructed by your physician -Make any legal decisions or sign important papers.  Meals: Start with liquid foods such as gelatin or soup. Progress to regular foods as tolerated. Avoid greasy, spicy, heavy foods. If nausea and/or vomiting occur, drink only clear liquids until the  nausea and/or vomiting subsides. Call your physician if vomiting continues.  Special Instructions/Symptoms: Your throat may feel dry or sore from the anesthesia or the breathing tube placed in your throat during surgery. If this causes discomfort, gargle with warm salt water. The discomfort should disappear within 24 hours.  If you had a scopolamine patch placed behind your ear for the management of post- operative nausea and/or vomiting:  1. The medication in the patch is effective for 72 hours, after which it should be removed.  Wrap patch in a tissue and discard in the trash. Wash hands thoroughly with soap and water. 2. You may remove the patch earlier than 72 hours if you experience unpleasant side effects which may include dry mouth, dizziness or visual disturbances. 3. Avoid touching the patch. Wash your hands with soap and water after contact with the patch.      Post Anesthesia Home Care Instructions  Activity: Get plenty of rest for the remainder of the day. A responsible individual must stay with you for 24 hours following the procedure.  For the next 24 hours, DO NOT: -Drive a car -Paediatric nurse -Drink alcoholic beverages -Take any medication unless instructed by your physician -Make any legal decisions or sign important papers.  Meals: Start with liquid foods such as gelatin or soup. Progress to regular foods as tolerated. Avoid greasy, spicy, heavy foods. If nausea and/or vomiting occur, drink only clear liquids until the nausea and/or vomiting subsides. Call your physician if vomiting continues.  Special Instructions/Symptoms: Your throat may feel dry or sore from the anesthesia or the breathing tube placed in your throat during surgery. If this causes discomfort, gargle with warm salt water. The discomfort should disappear within 24 hours.  If you had a scopolamine patch placed behind your ear for the management of post- operative nausea and/or vomiting:  1. The  medication in the patch is effective for 72 hours, after which it should be removed.  Wrap patch in a tissue and discard in the trash. Wash hands thoroughly with soap and water. 2. You may remove the patch earlier than 72 hours if you experience unpleasant side effects which may include dry mouth, dizziness or visual disturbances. 3. Avoid touching the patch. Wash your hands with soap and water after contact with the patch.

## 2020-10-03 NOTE — Transfer of Care (Signed)
Immediate Anesthesia Transfer of Care Note  Patient: Anna Martinez  Procedure(s) Performed: KNEE ARTHROSCOPY WITH LATERAL MENISECTOMY (Right: Knee) CHONDROPLASTY (Right)  Patient Location: PACU  Anesthesia Type:General and Regional  Level of Consciousness: awake, alert  and oriented  Airway & Oxygen Therapy: Patient Spontanous Breathing and Patient connected to face mask oxygen  Post-op Assessment: Report given to RN and Post -op Vital signs reviewed and stable  Post vital signs: Reviewed and stable  Last Vitals:  Vitals Value Taken Time  BP    Temp    Pulse    Resp    SpO2      Last Pain:  Vitals:   10/03/20 0904  TempSrc: Oral  PainSc: 0-No pain         Complications: No notable events documented.

## 2020-10-03 NOTE — Interval H&P Note (Signed)
History and Physical Interval Note:  10/03/2020 9:04 AM  Anna Martinez  has presented today for surgery, with the diagnosis of right knee lateral meniscus tear, chondromalacia patella.  The various methods of treatment have been discussed with the patient and family. After consideration of risks, benefits and other options for treatment, the patient has consented to  Procedure(s): KNEE ARTHROSCOPY WITH LATERAL MENISECTOMY (Right) CHONDROPLASTY (Right) as a surgical intervention.  The patient's history has been reviewed, patient examined, no change in status, stable for surgery.  I have reviewed the patient's chart and labs.  Questions were answered to the patient's satisfaction.     Lorn Junes

## 2020-10-03 NOTE — Telephone Encounter (Signed)
Pt needs labs rechecked before we refill as she is at increased risk of overreplacement. Ok for her to stop vit D till next visit

## 2020-10-03 NOTE — Telephone Encounter (Signed)
LAST APPOINTMENT DATE: 08/29/20 NEXT APPOINTMENT DATE: 10/24/20   [facility] [id number] Anna Martinez, [address] AT [address] [place] [address] Phone: [phone] Fax: [place] [id number] Anna Martinez, [address] [place] [address] Phone: [phone] Fax: [phone]  Patient is requesting a refill of the following medications: Requested Prescriptions   Pending Prescriptions Disp Refills   Vitamin D, Ergocalciferol, (DRISDOL) 1.25 MG (50000 UNIT) CAPS capsule [Pharmacy Med Name: VITAMIN D2 50,000IU (ERGO) CAP RX] 4 capsule 0    Sig: TAKE 1 CAPSULE BY MOUTH EVERY 7 DAYS    Date last filled: 08/29/20 Previously prescribed by [facility]  Lab Results  Component Value Date   HGBA1C 6.4 (H) 06/23/2020   HGBA1C 6.6 (H) 04/07/2020   Lab Results  Component Value Date   LDLCALC 141 (H) 04/07/2020   CREATININE 0.74 09/30/2020   Lab Results  Component Value Date   VD25OH 34.2 04/07/2020    BP Readings from Last 3 Encounters:  09/14/20 (!) 145/91  08/29/20 140/76  06/29/20 130/76

## 2020-10-04 ENCOUNTER — Encounter (HOSPITAL_BASED_OUTPATIENT_CLINIC_OR_DEPARTMENT_OTHER): Payer: Self-pay | Admitting: Orthopedic Surgery

## 2020-10-12 ENCOUNTER — Encounter: Payer: BC Managed Care – PPO | Admitting: Gastroenterology

## 2020-10-13 ENCOUNTER — Other Ambulatory Visit (HOSPITAL_COMMUNITY): Payer: Self-pay | Admitting: Orthopedic Surgery

## 2020-10-13 ENCOUNTER — Other Ambulatory Visit: Payer: Self-pay

## 2020-10-13 ENCOUNTER — Ambulatory Visit (HOSPITAL_COMMUNITY)
Admission: RE | Admit: 2020-10-13 | Discharge: 2020-10-13 | Disposition: A | Discharge status: Home / Self Care | Payer: BC Managed Care – PPO | Source: Ambulatory Visit | Attending: Orthopedic Surgery | Admitting: Orthopedic Surgery

## 2020-10-13 DIAGNOSIS — M7989 Other specified soft tissue disorders: Secondary | ICD-10-CM | POA: Diagnosis not present

## 2020-10-13 DIAGNOSIS — M79604 Pain in right leg: Secondary | ICD-10-CM

## 2020-10-24 ENCOUNTER — Ambulatory Visit
Admission: RE | Admit: 2020-10-24 | Discharge: 2020-10-24 | Disposition: A | Discharge status: Home / Self Care | Payer: BC Managed Care – PPO | Source: Ambulatory Visit | Attending: Radiation Oncology | Admitting: Radiation Oncology

## 2020-10-24 ENCOUNTER — Encounter (INDEPENDENT_AMBULATORY_CARE_PROVIDER_SITE_OTHER): Payer: Self-pay | Admitting: Family Medicine

## 2020-10-24 ENCOUNTER — Ambulatory Visit (AMBULATORY_SURGERY_CENTER): Payer: Self-pay

## 2020-10-24 ENCOUNTER — Other Ambulatory Visit: Payer: Self-pay

## 2020-10-24 ENCOUNTER — Ambulatory Visit (INDEPENDENT_AMBULATORY_CARE_PROVIDER_SITE_OTHER): Payer: BC Managed Care – PPO | Admitting: Family Medicine

## 2020-10-24 VITALS — Ht 69.0 in | Wt 335.0 lb

## 2020-10-24 VITALS — BP 140/70 | HR 70 | Temp 97.9°F | Ht 69.0 in | Wt 331.0 lb

## 2020-10-24 DIAGNOSIS — Z170 Estrogen receptor positive status [ER+]: Secondary | ICD-10-CM | POA: Insufficient documentation

## 2020-10-24 DIAGNOSIS — Z6841 Body Mass Index (BMI) 40.0 and over, adult: Secondary | ICD-10-CM | POA: Diagnosis not present

## 2020-10-24 DIAGNOSIS — Z9189 Other specified personal risk factors, not elsewhere classified: Secondary | ICD-10-CM

## 2020-10-24 DIAGNOSIS — E559 Vitamin D deficiency, unspecified: Secondary | ICD-10-CM | POA: Diagnosis not present

## 2020-10-24 DIAGNOSIS — Z1211 Encounter for screening for malignant neoplasm of colon: Secondary | ICD-10-CM

## 2020-10-24 DIAGNOSIS — I10 Essential (primary) hypertension: Secondary | ICD-10-CM | POA: Diagnosis not present

## 2020-10-24 DIAGNOSIS — C50512 Malignant neoplasm of lower-outer quadrant of left female breast: Secondary | ICD-10-CM | POA: Insufficient documentation

## 2020-10-24 MED ORDER — PLENVU 140 G PO SOLR: 1.0000 | ORAL | 0 refills | Status: DC

## 2020-10-24 NOTE — Progress Notes (Signed)
No egg or soy allergy known to patient  No issues known to pt with past sedation with any surgeries or procedures Patient denies ever being told they had issues or difficulty with intubation  No FH of Malignant Hyperthermia Pt is not on diet pills Pt is not on  home 02  Pt is not on blood thinners  Pt denies issues with constipation at this time; No A fib or A flutter  EMMI video via [place] 19 guidelines implemented in PV today with Pt and RN  Pt is fully vaccinated for Covid x 2; Coupon given to pt in PV today, Code to Pharmacy and NO PA's for preps discussed with pt in PV today  Discussed with pt there will be an out-of-pocket cost for prep and that varies from $0 to 70 +  dollars  Due to the COVID-19 pandemic we are asking patients to follow certain guidelines.  Pt aware of COVID protocols and [facility] guidelines

## 2020-10-24 NOTE — Progress Notes (Signed)
Chief Complaint:   OBESITY Anna Martinez is here to discuss her progress with her obesity treatment plan along with follow-up of her obesity related diagnoses. Anna Martinez is on the Category 4 Plan and states she is following her eating plan approximately 60% of the time. Anna Martinez states she is walking for 30 minutes 2-3 times per week.  Today's visit was #: 7 Starting weight: 353 lbs Starting date: 04/07/2020 Today's weight: 331 lbs Today's date: 10/24/2020 Total lbs lost to date: 22 Total lbs lost since last in-office visit: 9  Interim History: Ambry continues to do well with weight loss. She is finished with chemo and radiation, and her knee surgery. She states she will be able to concentrate on weight loss more now.  Subjective:   1. Essential hypertension, benign Anna Martinez continues to work on diet and weight loss. Her blood pressure is mildly elevated today, but she is taking her medications regularly.  2. Vitamin D deficiency Anna Martinez is on Vit D OTC, and she is due to have labs checked soon.  3. At risk for heart disease Anna Martinez is at a higher than average risk for cardiovascular disease due to obesity.   Assessment/Plan:   1. Essential hypertension, benign Anna Martinez will continue her medications, diet and weight loss to improve blood pressure control. We will recheck her blood pressure in 3 weeks.  2. Vitamin D deficiency Low Vitamin D level contributes to fatigue and are associated with obesity, breast, and colon cancer. We will recheck her Vit D level next month when we repeat all of her labs. Anna Martinez will continue OTC Vitamin D for now, and will follow-up for routine testing of Vitamin D, at least 2-3 times per year to avoid over-replacement.  3. At risk for heart disease Anna Martinez was given approximately 15 minutes of coronary artery disease prevention counseling today. She is 60 y.o. female and has risk factors for heart disease including obesity. We discussed intensive lifestyle modifications today with an  emphasis on specific weight loss instructions and strategies.   Repetitive spaced learning was employed today to elicit superior memory formation and behavioral change.  4. Obesity with current BMI of 48.9 Anna Martinez is currently in the action stage of change. As such, her goal is to continue with weight loss efforts. She has agreed to the Category 4 Plan or keeping a food journal and adhering to recommended goals of 1700-2000 calories and 100+ grams of protein daily.   Exercise goals: As is.  Behavioral modification strategies: increasing lean protein intake and travel eating strategies.  Anna Martinez has agreed to follow-up with our clinic in 3 weeks. She was informed of the importance of frequent follow-up visits to maximize her success with intensive lifestyle modifications for her multiple health conditions.   Objective:   Blood pressure 140/70, pulse 70, temperature 97.9 F (36.6 C), height '5\' 9"'$  (1.753 m), weight (!) 331 lb (150.1 kg), last menstrual period 07/21/2015, SpO2 98 %. Body mass index is 48.88 kg/m.  General: Cooperative, alert, well developed, in no acute distress. HEENT: Conjunctivae and lids unremarkable. Cardiovascular: Regular rhythm.  Lungs: Normal work of breathing. Neurologic: No focal deficits.   Lab Results  Component Value Date   CREATININE 0.74 09/30/2020   BUN 14 09/30/2020   NA 138 09/30/2020   K 3.9 09/30/2020   CL 102 09/30/2020   CO2 27 09/30/2020   Lab Results  Component Value Date   ALT 20 04/07/2020   AST 19 04/07/2020   ALKPHOS 143 (H) 04/07/2020  BILITOT 0.6 04/07/2020   Lab Results  Component Value Date   HGBA1C 6.4 (H) 06/23/2020   HGBA1C 6.6 (H) 04/07/2020   Lab Results  Component Value Date   INSULIN 17.0 04/07/2020   Lab Results  Component Value Date   TSH 3.970 04/07/2020   Lab Results  Component Value Date   CHOL 202 (H) 04/07/2020   HDL 44 04/07/2020   LDLCALC 141 (H) 04/07/2020   TRIG 96 04/07/2020   Lab Results   Component Value Date   VD25OH 34.2 04/07/2020   Lab Results  Component Value Date   WBC 8.8 06/23/2020   HGB 13.9 06/23/2020   HCT 43.0 06/23/2020   MCV 86.9 06/23/2020   PLT 295 06/23/2020   No results found for: IRON, TIBC, FERRITIN  Attestation Statements:   Reviewed by clinician on day of visit: allergies, medications, problem list, medical history, surgical history, family history, social history, and previous encounter notes.   I, Trixie Dredge, am acting as transcriptionist for Dennard Nip, MD.  I have reviewed the above documentation for accuracy and completeness, and I agree with the above. -  Dennard Nip, MD

## 2020-10-24 NOTE — Progress Notes (Signed)
  Radiation Oncology         [phone] ________________________________  Name: Anna Martinez MRN: [id number]  Date of Service: 10/24/2020  DOB: 1960-04-16  Post Treatment Telephone Note  Diagnosis:   Stage IA, pT2N0M0 grade 2, ER/PR positive invasive lobular carcinoma of the left breast  Interval Since Last Radiation:  6 weeks   08/18/2020 through 09/14/2020 Site Technique Total Dose (Gy) Dose per Fx (Gy) Completed Fx Beam Energies  Breast, Left: Breast_Lt 3D 42.56/42.56 2.66 16/16 10X  Breast, Left: Breast_Lt_Bst 3D 8/8 2 4/4 6X, 10X    Narrative:  The patient was contacted today for routine follow-up. During treatment she did very well with radiotherapy but did develop moist desquamation. She reports she is healed up and still seeing some hyperpigmentation but feels much better than at the conclusion of treatment.  Impression/Plan: 1. Stage IA, pT2N0M0 grade 2, ER/PR positive invasive lobular carcinoma of the left breast. The patient has been doing well since completion of radiotherapy. We discussed that we would be happy to continue to follow her as needed, but she will also continue to follow up with Dr. Burr Medico in medical oncology. She was counseled on skin care as well as measures to avoid sun exposure to this area.  2. Survivorship. We discussed the importance of survivorship evaluation and encouraged her to attend her upcoming visit with that clinic.     Carola Rhine, PAC

## 2020-10-25 ENCOUNTER — Encounter: Payer: Self-pay | Admitting: Gastroenterology

## 2020-11-07 ENCOUNTER — Ambulatory Visit (AMBULATORY_SURGERY_CENTER): Payer: BC Managed Care – PPO | Admitting: Gastroenterology

## 2020-11-07 ENCOUNTER — Other Ambulatory Visit: Payer: Self-pay

## 2020-11-07 ENCOUNTER — Encounter: Payer: Self-pay | Admitting: Gastroenterology

## 2020-11-07 VITALS — BP 140/76 | HR 62 | Temp 97.5°F | Resp 20 | Ht 69.0 in | Wt 335.0 lb

## 2020-11-07 DIAGNOSIS — D125 Benign neoplasm of sigmoid colon: Secondary | ICD-10-CM

## 2020-11-07 DIAGNOSIS — K635 Polyp of colon: Secondary | ICD-10-CM | POA: Diagnosis not present

## 2020-11-07 DIAGNOSIS — D122 Benign neoplasm of ascending colon: Secondary | ICD-10-CM

## 2020-11-07 DIAGNOSIS — Z1211 Encounter for screening for malignant neoplasm of colon: Secondary | ICD-10-CM

## 2020-11-07 MED ORDER — SODIUM CHLORIDE 0.9 % IV SOLN: 500.0000 mL | Freq: Once | INTRAVENOUS | Status: DC

## 2020-11-07 NOTE — Progress Notes (Signed)
Called to room to assist during endoscopic procedure.  Patient ID and intended procedure confirmed with present staff. Received instructions for my participation in the procedure from the performing physician.  

## 2020-11-07 NOTE — Progress Notes (Signed)
Referring Provider: Maurice Small, MD Primary Care Physician:  Maurice Small, MD  Reason for Procedure:  Colon cancer screening   IMPRESSION:  Need for colon cancer screening Appropriate candidate for anesthesia in the [facility]  PLAN: Colonoscopy to be scheduled at the hospital   HPI: Anna Martinez is a 60 y.o. female presents for screening colonoscopy.  No prior colonoscopy or colon cancer screening.  No baseline GI symptoms.   No known family history of colon cancer or polyps. No family history of uterine/endometrial cancer, pancreatic cancer or gastric/stomach cancer.   Past Medical History:  Diagnosis Date   Abnormal Pap smear 08/13/2012   LSIL may include HPV or mild Dysplasia (CIN1)   Acute lateral meniscus tear of right knee, subsequent encounter    Breast cancer ([facility]) 05/2020   LEFT   Depression    Family history of breast cancer 05/30/2020   Family history of pancreatic cancer 05/30/2020   Family history of prostate cancer 05/30/2020   Family history of renal cancer 05/30/2020   Family history of skin cancer 05/30/2020   Gallbladder problem    Heart murmur    cardiologist a long time ago ECHO not completed   Hypertension    on meds   Knee pain    Lateral meniscus tear    right knee   Obesity    Other fatigue    Pre-diabetes    Shortness of breath on exertion    Sleep apnea    uses CPAP nightly   Vitamin D deficiency     Past Surgical History:  Procedure Laterality Date   BREAST LUMPECTOMY WITH RADIOACTIVE SEED AND SENTINEL LYMPH NODE BIOPSY Left 06/29/2020   Procedure: LEFT BREAST LUMPECTOMY WITH RADIOACTIVE SEED;  Surgeon: Jovita Kussmaul, MD;  Location: [facility];  Service: General;  Laterality: Left;   CYST REMOVAL TRUNK Left 06/29/2020   Procedure: REMOVAL OF SEBACEOUS CYST;  Surgeon: Megan Salon, MD;  Location: [facility];  Service: Gynecology;  Laterality: Left;   HYSTEROSCOPY WITH D & C N/A 06/29/2020   Procedure: DILATATION AND CURETTAGE /HYSTEROSCOPY  WITH MYOSURE;  Surgeon: Megan Salon, MD;  Location: [facility];  Service: Gynecology;  Laterality: N/A;   KNEE ARTHROSCOPY WITH LATERAL MENISECTOMY Right 10/03/2020   Procedure: KNEE ARTHROSCOPY WITH LATERAL MENISECTOMY;  Surgeon: Elsie Saas, MD;  Location: [facility];  Service: Orthopedics;  Laterality: Right;   LAPAROSCOPIC CHOLECYSTECTOMY  2001   SENTINEL NODE BIOPSY Left 06/29/2020   Procedure: LEFT SENTINEL LYMPH NODE BIOPSY;  Surgeon: Autumn Messing III, MD;  Location: [facility];  Service: General;  Laterality: Left;    Current Outpatient Medications  Medication Sig Dispense Refill   buPROPion (WELLBUTRIN XL) 300 MG 24 hr tablet Take 1 tablet (300 mg total) by mouth daily. 30 tablet 8   exemestane (AROMASIN) 25 MG tablet Take 1 tablet (25 mg total) by mouth daily after breakfast. 30 tablet 2   mometasone (ELOCON) 0.1 % ointment Apply topically as directed once or twice weekly (Patient taking differently: Apply 1 application topically 2 (two) times daily as needed. Apply topically as directed once or twice weekly) 45 g 2   valsartan-hydrochlorothiazide (DIOVAN-HCT) 160-12.5 MG tablet Take 1 tablet by mouth daily.     VITAMIN D PO Take 1 tablet by mouth daily at 6 (six) AM.     Current Facility-Administered Medications  Medication Dose Route Frequency Provider Last Rate Last Admin   0.9 %  sodium chloride infusion  500  mL Intravenous Once Thornton Park, MD        Allergies as of 11/07/2020 - Review Complete 11/07/2020  Allergen Reaction Noted   Bactrim [sulfamethoxazole-trimethoprim] Itching and Rash 07/29/2015   Penicillins  08/11/2012   Sulfa antibiotics Rash 09/29/2020    Family History  Problem Relation Age of Onset   Hypertension Mother    Hyperlipidemia Mother    Heart disease Mother    Obesity Mother    Skin cancer Mother    Hypertension Father    Obesity Father    Prostate cancer Father        dx 25s   Renal cancer Father        dx 76s   Cancer Father         mouth; dx after 38   Skin cancer Father        dx after 99   Melanoma Maternal Uncle        mets; dx 35s   Pancreatic cancer Paternal Aunt        dx 62s   Cancer Paternal Uncle        unknown type; dx unknown age   Skin cancer Maternal Grandmother        face   Cancer Paternal Grandmother        unknown type; ? pancreatic; dx after 20   Breast cancer Cousin        dx 79s; paternal cousin   Colon cancer Neg Hx    Colon polyps Neg Hx    Esophageal cancer Neg Hx    Rectal cancer Neg Hx    Stomach cancer Neg Hx      Physical Exam: General:   Alert,  well-nourished, pleasant and cooperative in NAD Head:  Normocephalic and atraumatic. Eyes:  Sclera clear, no icterus.   Conjunctiva pink. Mouth:  No deformity or lesions.   Neck:  Supple; no masses or thyromegaly. Lungs:  Clear throughout to auscultation.   No wheezes. Heart:  Regular rate and rhythm; no murmurs. Abdomen:  Soft, non-tender, nondistended, normal bowel sounds, no rebound or guarding.  Msk:  Symmetrical. No boney deformities LAD: No inguinal or umbilical LAD Extremities:  No clubbing or edema. Neurologic:  Alert and  oriented x4;  grossly nonfocal Skin:  No obvious rash or bruise. Psych:  Alert and cooperative. Normal mood and affect.     Terez Freimark L. Tarri Glenn, MD, MPH 11/07/2020, 1:28 PM

## 2020-11-07 NOTE — Op Note (Signed)
[facility] Patient Name: Anna Martinez Procedure Date: 11/07/2020 2:18 PM MRN: [id number] Endoscopist: Thornton Park MD, MD Age: 60 Referring MD:  Date of Birth: 04-12-1960 Gender: Female Account #: [id number] Procedure:                Colonoscopy Indications:              Screening for colorectal malignant neoplasm, This                            is the patient's first colonoscopy Medicines:                Monitored Anesthesia Care Procedure:                Pre-Anesthesia Assessment:                           - Prior to the procedure, a History and Physical                            was performed, and patient medications and                            allergies were reviewed. The patient's tolerance of                            previous anesthesia was also reviewed. The risks                            and benefits of the procedure and the sedation                            options and risks were discussed with the patient.                            All questions were answered, and informed consent                            was obtained. Prior Anticoagulants: The patient has                            taken no previous anticoagulant or antiplatelet                            agents. ASA Grade Assessment: III - A patient with                            severe systemic disease. After reviewing the risks                            and benefits, the patient was deemed in                            satisfactory condition to undergo the procedure.  After obtaining informed consent, the colonoscope                            was passed under direct vision. Throughout the                            procedure, the patient's blood pressure, pulse, and                            oxygen saturations were monitored continuously. The                            Olympus CF-HQ190L ([id number]) Colonoscope was                            introduced through the  anus and advanced to the the                            cecum, identified by appendiceal orifice and                            ileocecal valve. A second forward view of the right                            colon was performed. The colonoscopy was performed                            without difficulty. The patient tolerated the                            procedure well. The quality of the bowel                            preparation was good. The ileocecal valve,                            appendiceal orifice, and rectum were photographed. Scope In: 2:23:37 PM Scope Out: 2:35:46 PM Scope Withdrawal Time: 0 hours 10 minutes 16 seconds  Total Procedure Duration: 0 hours 12 minutes 9 seconds  Findings:                 The perianal and digital rectal examinations were                            normal.                           Non-bleeding internal hemorrhoids were found.                           A few small-mouthed diverticula were found in the                            sigmoid colon.  Two sessile polyps were found in the sigmoid colon                            and ascending colon. The polyps were 1 mm in size.                            These polyps were removed with a cold biopsy                            forceps. Resection and retrieval were complete.                            Estimated blood loss was minimal.                           The exam was otherwise without abnormality on                            direct and retroflexion views. Complications:            No immediate complications. Estimated blood loss:                            Minimal. Estimated Blood Loss:     Estimated blood loss was minimal. Impression:               - Non-bleeding internal hemorrhoids.                           - Diverticulosis in the sigmoid colon.                           - Two 1 mm polyps in the sigmoid colon and in the                            ascending colon,  removed with a cold biopsy                            forceps. Resected and retrieved.                           - The examination was otherwise normal on direct                            and retroflexion views. Recommendation:           - Patient has a contact number available for                            emergencies. The signs and symptoms of potential                            delayed complications were discussed with the  patient. Return to normal activities tomorrow.                            Written discharge instructions were provided to the                            patient.                           - Continue present medications.                           - Await pathology results.                           - Repeat colonoscopy date to be determined after                            pending pathology results are reviewed for                            surveillance.                           - Follow a high fiber diet. Drink at least 64                            ounces of water daily. Add a daily stool bulking                            agent such as psyllium (an exampled would be                            Metamucil).                           - Emerging evidence supports eating a diet of                            fruits, vegetables, grains, calcium, and yogurt                            while reducing red meat and alcohol may reduce the                            risk of colon cancer.                           - Thank you for allowing me to be involved in your                            colon cancer prevention. Thornton Park MD, MD 11/07/2020 2:42:10 PM This report has been signed electronically.

## 2020-11-07 NOTE — Progress Notes (Signed)
D.T. vital signs. °

## 2020-11-07 NOTE — Patient Instructions (Signed)
Handouts given for high fiber diet, diverticulosis and polyps.  YOU HAD AN ENDOSCOPIC PROCEDURE TODAY AT [facility]:   Refer to the procedure report that was given to you for any specific questions about what was found during the examination.  If the procedure report does not answer your questions, please call your gastroenterologist to clarify.  If you requested that your care partner not be given the details of your procedure findings, then the procedure report has been included in a sealed envelope for you to review at your convenience later.  YOU SHOULD EXPECT: Some feelings of bloating in the abdomen. Passage of more gas than usual.  Walking can help get rid of the air that was put into your GI tract during the procedure and reduce the bloating. If you had a lower endoscopy (such as a colonoscopy or flexible sigmoidoscopy) you may notice spotting of blood in your stool or on the toilet paper. If you underwent a bowel prep for your procedure, you may not have a normal bowel movement for a few days.  Please Note:  You might notice some irritation and congestion in your nose or some drainage.  This is from the oxygen used during your procedure.  There is no need for concern and it should clear up in a day or so.  SYMPTOMS TO REPORT IMMEDIATELY:  Following lower endoscopy (colonoscopy or flexible sigmoidoscopy):  Excessive amounts of blood in the stool  Significant tenderness or worsening of abdominal pains  Swelling of the abdomen that is new, acute  Fever of 100F or higher  For urgent or emergent issues, a gastroenterologist can be reached at any hour by calling [phone]. Do not use MyChart messaging for urgent concerns.    DIET:  We do recommend a small meal at first, but then you may proceed to your regular diet.  Drink plenty of fluids but you should avoid alcoholic beverages for 24 hours.  ACTIVITY:  You should plan to take it easy for the rest of today and you  should NOT DRIVE or use heavy machinery until tomorrow (because of the sedation medicines used during the test).    FOLLOW UP: Our staff will call the number listed on your records 48-72 hours following your procedure to check on you and address any questions or concerns that you may have regarding the information given to you following your procedure. If we do not reach you, we will leave a message.  We will attempt to reach you two times.  During this call, we will ask if you have developed any symptoms of COVID 19. If you develop any symptoms (ie: fever, flu-like symptoms, shortness of breath, cough etc.) before then, please call [phone].  If you test positive for Covid 19 in the 2 weeks post procedure, please call and report this information to [place].    If any biopsies were taken you will be contacted by phone or by letter within the next 1-3 weeks.  Please call us at [phone] if you have not heard about the biopsies in 3 weeks.    SIGNATURES/CONFIDENTIALITY: You and/or your care partner have signed paperwork which will be entered into your electronic medical record.  These signatures attest to the fact that that the information above on your After Visit Summary has been reviewed and is understood.  Full responsibility of the confidentiality of this discharge information lies with you and/or your care-partner.

## 2020-11-07 NOTE — Progress Notes (Signed)
PT taken to PACU. Monitors in place. VSS. Report given to RN. 

## 2020-11-07 NOTE — Progress Notes (Signed)
Pt's states no medical or surgical changes since previsit or office visit. 

## 2020-11-09 ENCOUNTER — Telehealth: Payer: Self-pay | Admitting: *Deleted

## 2020-11-09 NOTE — Telephone Encounter (Signed)
  Follow up Call-  Call back number 11/07/2020  Post procedure Call Back phone  # [phone]  Permission to leave phone message Yes  Some recent data might be hidden     Patient questions:  Do you have a fever, pain , or abdominal swelling? No. Pain Score  0 *  Have you tolerated food without any problems? Yes.    Have you been able to return to your normal activities? Yes.    Do you have any questions about your discharge instructions: Diet   No. Medications  No. Follow up visit  No.  Do you have questions or concerns about your Care? No.  Actions: * If pain score is 4 or above: No action needed, pain <4.  ,Have you developed a fever since your procedure? no  2.   Have you had an respiratory symptoms (SOB or cough) since your procedure? no  3.   Have you tested positive for COVID 19 since your procedure no  4.   Have you had any family members/close contacts diagnosed with the COVID 19 since your procedure?  no   If yes to any of these questions please route to Joylene John, RN and Joella Prince, RN

## 2020-11-13 ENCOUNTER — Encounter: Payer: Self-pay | Admitting: Gastroenterology

## 2020-11-21 ENCOUNTER — Encounter (INDEPENDENT_AMBULATORY_CARE_PROVIDER_SITE_OTHER): Payer: Self-pay | Admitting: Family Medicine

## 2020-11-21 ENCOUNTER — Other Ambulatory Visit: Payer: Self-pay

## 2020-11-21 ENCOUNTER — Ambulatory Visit (INDEPENDENT_AMBULATORY_CARE_PROVIDER_SITE_OTHER): Payer: BC Managed Care – PPO | Admitting: Family Medicine

## 2020-11-21 VITALS — BP 134/82 | HR 80 | Temp 98.2°F | Ht 69.0 in | Wt 331.0 lb

## 2020-11-21 DIAGNOSIS — I10 Essential (primary) hypertension: Secondary | ICD-10-CM | POA: Diagnosis not present

## 2020-11-21 DIAGNOSIS — Z6841 Body Mass Index (BMI) 40.0 and over, adult: Secondary | ICD-10-CM | POA: Diagnosis not present

## 2020-11-21 DIAGNOSIS — Z9189 Other specified personal risk factors, not elsewhere classified: Secondary | ICD-10-CM

## 2020-11-21 NOTE — Progress Notes (Signed)
Chief Complaint:   OBESITY Anna Martinez is here to discuss her progress with her obesity treatment plan along with follow-up of her obesity related diagnoses. Anna Martinez is on the Category 4 Plan or keeping a food journal and adhering to recommended goals of 1700-2000 calories and 100+ grams of protein daily and states she is following her eating plan approximately 50% of the time. Anna Martinez states she is doing 0 minutes 0 times per week.  Today's visit was #: 8 Starting weight: 353 lbs Starting date: 04/07/2020 Today's weight: 331 lbs Today's date: 11/21/2020 Total lbs lost to date: 22 Total lbs lost since last in-office visit: 0  Interim History: Anna Martinez has done well maintaining her weight loss. She is having significant hot flashes, which is affecting her eating. She is trying to increase her water intake.  Subjective:   1. Essential hypertension, benign Anna Martinez's blood pressure is well controlled on her medications. She is working on weight loss.  2. At risk for heart disease Anna Martinez is at a higher than average risk for cardiovascular disease due to obesity.   Assessment/Plan:   1. Essential hypertension, benign Anna Martinez will continue with her diet, exercise, and medications, and will cork on decreasing Na+ in her diet. She will watch for signs of hypotension as she continues her lifestyle modifications.  2. At risk for heart disease Anna Martinez was given approximately 15 minutes of coronary artery disease prevention counseling today. She is 60 y.o. female and has risk factors for heart disease including obesity. We discussed intensive lifestyle modifications today with an emphasis on specific weight loss instructions and strategies.   Repetitive spaced learning was employed today to elicit superior memory formation and behavioral change.  3. Obesity with current BMI of 49.0 Anna Martinez is currently in the action stage of change. As such, her goal is to continue with weight loss efforts. She has agreed to the  Category 4 Plan.   We will recheck fasting labs at her next visit.  Behavioral modification strategies: increasing lean protein intake, increasing water intake, decreasing sodium intake, and travel eating strategies.  Anna Martinez has agreed to follow-up with our clinic in 4 weeks. She was informed of the importance of frequent follow-up visits to maximize her success with intensive lifestyle modifications for her multiple health conditions.   Objective:   Blood pressure 134/82, pulse 80, temperature 98.2 F (36.8 C), height 5\' 9"  (1.753 m), weight (!) 331 lb (150.1 kg), last menstrual period 07/21/2015, SpO2 98 %. Body mass index is 48.88 kg/m.  General: Cooperative, alert, well developed, in no acute distress. HEENT: Conjunctivae and lids unremarkable. Cardiovascular: Regular rhythm.  Lungs: Normal work of breathing. Neurologic: No focal deficits.   Lab Results  Component Value Date   CREATININE 0.74 09/30/2020   BUN 14 09/30/2020   NA 138 09/30/2020   K 3.9 09/30/2020   CL 102 09/30/2020   CO2 27 09/30/2020   Lab Results  Component Value Date   ALT 20 04/07/2020   AST 19 04/07/2020   ALKPHOS 143 (H) 04/07/2020   BILITOT 0.6 04/07/2020   Lab Results  Component Value Date   HGBA1C 6.4 (H) 06/23/2020   HGBA1C 6.6 (H) 04/07/2020   Lab Results  Component Value Date   INSULIN 17.0 04/07/2020   Lab Results  Component Value Date   TSH 3.970 04/07/2020   Lab Results  Component Value Date   CHOL 202 (H) 04/07/2020   HDL 44 04/07/2020   LDLCALC 141 (H) 04/07/2020  TRIG 96 04/07/2020   Lab Results  Component Value Date   VD25OH 34.2 04/07/2020   Lab Results  Component Value Date   WBC 8.8 06/23/2020   HGB 13.9 06/23/2020   HCT 43.0 06/23/2020   MCV 86.9 06/23/2020   PLT 295 06/23/2020   No results found for: IRON, TIBC, FERRITIN  Attestation Statements:   Reviewed by clinician on day of visit: allergies, medications, problem list, medical history, surgical  history, family history, social history, and previous encounter notes.   I, Trixie Dredge, am acting as transcriptionist for Dennard Nip, MD.  I have reviewed the above documentation for accuracy and completeness, and I agree with the above. -  Dennard Nip, MD

## 2020-11-30 ENCOUNTER — Encounter: Payer: Self-pay | Admitting: Hematology

## 2020-12-05 ENCOUNTER — Encounter: Payer: Self-pay | Admitting: Hematology

## 2020-12-06 ENCOUNTER — Telehealth: Payer: Self-pay | Admitting: Nurse Practitioner

## 2020-12-06 ENCOUNTER — Other Ambulatory Visit: Payer: Self-pay | Admitting: Nurse Practitioner

## 2020-12-06 MED ORDER — LETROZOLE 2.5 MG PO TABS: 2.5000 mg | ORAL_TABLET | Freq: Every day | ORAL | 1 refills | Status: DC

## 2020-12-06 NOTE — Telephone Encounter (Signed)
I called Ms. Clinkscale to discuss her medication side effects. She has been on exemestane, now experiencing 15-20 intense hot flashes during the day and night. She also has mild joint aches and fatigue, which could be unrelated and are not a big problem.  She functions normally without difficulty.    We discussed management with SSRI vs gabapentin, she prefers to hold off on additional medication at this time.  Not an ideal candidate for tamoxifen given history of endometrial polyp and DDI with Wellbutrin.    We discussed alternative antiestrogen therapies including anastrozole and letrozole and the potential side effect and benefit of those.  The consensus is to stop exemestane and start letrozole in 3-4 weeks, after hot flashes have improved.  We will push out her survivorship visit to December so we can discuss how she is tolerating letrozole.  Patient agrees with the plan and has no other needs at this time, she appreciates the call.  Cira Rue, NP

## 2020-12-07 ENCOUNTER — Encounter: Payer: Self-pay | Admitting: Nurse Practitioner

## 2020-12-15 ENCOUNTER — Encounter (HOSPITAL_COMMUNITY): Payer: Self-pay

## 2020-12-15 ENCOUNTER — Encounter: Payer: BC Managed Care – PPO | Admitting: Nurse Practitioner

## 2020-12-15 ENCOUNTER — Ambulatory Visit (HOSPITAL_COMMUNITY): Admit: 2020-12-15 | Payer: BC Managed Care – PPO | Admitting: Gastroenterology

## 2020-12-15 SURGERY — COLONOSCOPY WITH PROPOFOL
Anesthesia: Monitor Anesthesia Care

## 2020-12-22 ENCOUNTER — Ambulatory Visit (INDEPENDENT_AMBULATORY_CARE_PROVIDER_SITE_OTHER): Payer: BC Managed Care – PPO | Admitting: Family Medicine

## 2020-12-27 ENCOUNTER — Encounter: Payer: BC Managed Care – PPO | Admitting: Nurse Practitioner

## 2021-01-02 ENCOUNTER — Ambulatory Visit: Payer: BC Managed Care – PPO | Attending: General Surgery

## 2021-01-02 ENCOUNTER — Other Ambulatory Visit: Payer: Self-pay

## 2021-01-02 VITALS — Wt 343.0 lb

## 2021-01-02 DIAGNOSIS — Z483 Aftercare following surgery for neoplasm: Secondary | ICD-10-CM | POA: Insufficient documentation

## 2021-01-02 NOTE — Therapy (Signed)
[facility] @ [address] [place], [place], [address] Phone: [phone]   Fax:  [phone]  Physical Therapy Treatment  Patient Details  Name: Anna Martinez MRN: [id number] Date of Birth: 18-Mar-1960 Referring Provider (PT): Dr. Marlou Starks   Encounter Date: 01/02/2021   PT End of Session - 01/02/21 1353     Visit Number 2   # unchanged due to screen only   PT Start Time 1350    PT Stop Time 1402    PT Time Calculation (min) 12 min    Activity Tolerance Patient tolerated treatment well    Behavior During Therapy [facility] for tasks assessed/performed             Past Medical History:  Diagnosis Date   Abnormal Pap smear 08/13/2012   LSIL may include HPV or mild Dysplasia (CIN1)   Acute lateral meniscus tear of right knee, subsequent encounter    Breast cancer ([place]) 05/2020   LEFT   Depression    Family history of breast cancer 05/30/2020   Family history of pancreatic cancer 05/30/2020   Family history of prostate cancer 05/30/2020   Family history of renal cancer 05/30/2020   Family history of skin cancer 05/30/2020   Gallbladder problem    Heart murmur    cardiologist a long time ago ECHO not completed   Hypertension    on meds   Knee pain    Lateral meniscus tear    right knee   Obesity    Other fatigue    Pre-diabetes    Shortness of breath on exertion    Sleep apnea    uses CPAP nightly   Vitamin D deficiency     Past Surgical History:  Procedure Laterality Date   BREAST LUMPECTOMY WITH RADIOACTIVE SEED AND SENTINEL LYMPH NODE BIOPSY Left 06/29/2020   Procedure: LEFT BREAST LUMPECTOMY WITH RADIOACTIVE SEED;  Surgeon: Jovita Kussmaul, MD;  Location: [facility];  Service: General;  Laterality: Left;   CYST REMOVAL TRUNK Left 06/29/2020   Procedure: REMOVAL OF SEBACEOUS CYST;  Surgeon: Megan Salon, MD;  Location: [facility];  Service: Gynecology;  Laterality: Left;   HYSTEROSCOPY WITH D & C N/A 06/29/2020    Procedure: DILATATION AND CURETTAGE /HYSTEROSCOPY WITH MYOSURE;  Surgeon: Megan Salon, MD;  Location: [facility];  Service: Gynecology;  Laterality: N/A;   KNEE ARTHROSCOPY WITH LATERAL MENISECTOMY Right 10/03/2020   Procedure: KNEE ARTHROSCOPY WITH LATERAL MENISECTOMY;  Surgeon: Elsie Saas, MD;  Location: [facility];  Service: Orthopedics;  Laterality: Right;   LAPAROSCOPIC CHOLECYSTECTOMY  2001   SENTINEL NODE BIOPSY Left 06/29/2020   Procedure: LEFT SENTINEL LYMPH NODE BIOPSY;  Surgeon: Jovita Kussmaul, MD;  Location: [facility];  Service: General;  Laterality: Left;    Vitals:   01/02/21 1351  Weight: (!) 343 lb (155.6 kg)     Subjective Assessment - 01/02/21 1353     Subjective Pt returns for her 3 month L-Dex screen.    Pertinent History Pt was diagnosed in March with a routine mammogram.  She is now surgery on 06/29/20 for left  lumpectomy with SLNB. She also had a vulvar sebaceous cyst and endometrial polyp removed at the same time by Dr. Sabra Heck.                    L-DEX FLOWSHEETS - 01/02/21 1300       L-DEX LYMPHEDEMA SCREENING   Measurement Type Unilateral  L-DEX MEASUREMENT EXTREMITY Upper Extremity    POSITION  Standing    DOMINANT SIDE Right    At Risk Side Left    BASELINE SCORE (UNILATERAL) 3.4    L-DEX SCORE (UNILATERAL) 3.3    VALUE CHANGE (UNILAT) -0.1                                     PT Long Term Goals - 07/20/20 1304       PT LONG TERM GOAL #1   Title Pt will demonstrate full AROM of left shoulder post surgically    Period Weeks    Status Achieved    Target Date 07/20/20                   Plan - 01/02/21 1403     Clinical Impression Statement Pt returns for her 3 month L-Dex screen. Her change from baseline of -0.1 is WNLs so no further treatment is required at this time except to cont every 3 month L-Dex screens which pt is agreeable to. Also issued her a script from Dr. Burr Medico for a  compression bra as pt reports having recently finished radiation and can benefit from this as she continues to recover from radiation.    PT Next Visit Plan Cont every 3 month L-Dex screens for up to 2 years from her SLNB (~06/30/22)    Consulted and Agree with Plan of Care Patient             Patient will benefit from skilled therapeutic intervention in order to improve the following deficits and impairments:     Visit Diagnosis: Aftercare following surgery for neoplasm     Problem List Patient Active Problem List   Diagnosis Date Noted   Acute lateral meniscus tear of right knee, subsequent encounter 09/29/2020   Postmenopausal bleeding    Endometrial polyp    Vulvar cyst    Diabetes mellitus ([place]) 06/22/2020   Genetic testing 06/06/2020   Family history of breast cancer 05/30/2020   Family history of prostate cancer 05/30/2020   Family history of skin cancer 05/30/2020   Family history of pancreatic cancer 05/30/2020   Family history of renal cancer 05/30/2020   Malignant neoplasm of lower-outer quadrant of left breast of female, estrogen receptor positive ([place]) 05/18/2020   Hyperlipidemia 03/24/2020   Mixed hyperlipidemia 03/24/2020   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

## 2021-01-04 ENCOUNTER — Encounter (HOSPITAL_BASED_OUTPATIENT_CLINIC_OR_DEPARTMENT_OTHER): Payer: Self-pay | Admitting: Obstetrics & Gynecology

## 2021-01-04 ENCOUNTER — Other Ambulatory Visit (HOSPITAL_BASED_OUTPATIENT_CLINIC_OR_DEPARTMENT_OTHER): Payer: Self-pay | Admitting: *Deleted

## 2021-01-04 DIAGNOSIS — R4586 Emotional lability: Secondary | ICD-10-CM

## 2021-01-04 MED ORDER — BUPROPION HCL ER (XL) 300 MG PO TB24: 300.0000 mg | ORAL_TABLET | Freq: Every day | ORAL | 1 refills | Status: DC

## 2021-01-12 ENCOUNTER — Other Ambulatory Visit: Payer: Self-pay

## 2021-01-12 ENCOUNTER — Ambulatory Visit
Admission: RE | Admit: 2021-01-12 | Discharge: 2021-01-12 | Disposition: A | Discharge status: Home / Self Care | Payer: BC Managed Care – PPO | Source: Ambulatory Visit | Attending: Hematology | Admitting: Hematology

## 2021-01-12 DIAGNOSIS — E2839 Other primary ovarian failure: Secondary | ICD-10-CM

## 2021-01-17 ENCOUNTER — Telehealth: Payer: Self-pay | Admitting: *Deleted

## 2021-01-19 ENCOUNTER — Ambulatory Visit (INDEPENDENT_AMBULATORY_CARE_PROVIDER_SITE_OTHER): Payer: BC Managed Care – PPO | Admitting: Family Medicine

## 2021-01-19 ENCOUNTER — Other Ambulatory Visit: Payer: Self-pay

## 2021-01-19 ENCOUNTER — Encounter (INDEPENDENT_AMBULATORY_CARE_PROVIDER_SITE_OTHER): Payer: Self-pay | Admitting: Family Medicine

## 2021-01-19 VITALS — BP 166/69 | HR 67 | Temp 98.3°F | Ht 69.0 in | Wt 332.0 lb

## 2021-01-19 DIAGNOSIS — Z6841 Body Mass Index (BMI) 40.0 and over, adult: Secondary | ICD-10-CM

## 2021-01-19 DIAGNOSIS — Z9189 Other specified personal risk factors, not elsewhere classified: Secondary | ICD-10-CM

## 2021-01-19 DIAGNOSIS — E559 Vitamin D deficiency, unspecified: Secondary | ICD-10-CM | POA: Diagnosis not present

## 2021-01-19 DIAGNOSIS — E1169 Type 2 diabetes mellitus with other specified complication: Secondary | ICD-10-CM | POA: Diagnosis not present

## 2021-01-19 DIAGNOSIS — I10 Essential (primary) hypertension: Secondary | ICD-10-CM

## 2021-01-19 DIAGNOSIS — E782 Mixed hyperlipidemia: Secondary | ICD-10-CM | POA: Diagnosis not present

## 2021-01-19 NOTE — Progress Notes (Signed)
Chief Complaint:   OBESITY Anna Martinez is here to discuss her progress with her obesity treatment plan along with follow-up of her obesity related diagnoses. Anna Martinez is on the Category 4 Plan and states she is following her eating plan approximately 50% of the time. Anna Martinez states she is doing 0 minutes 0 times per week.  Today's visit was #: 9 Starting weight: 353 lbs Starting date: 04/07/2020 Today's weight: 332 lbs Today's date: 01/19/2021 Total lbs lost to date: 21 Total lbs lost since last in-office visit: 0  Interim History: Anna Martinez has done well with minimizing holiday weight gain. She is trying to be mindful and portion control even when she deviates from her plan.  Subjective:   1. Vitamin D deficiency Kash[place] is not on Vit D currently, and she is due for labs.  2. Essential hypertension Anna Martinez's blood pressure is elevated today. She is working on diet but she feels the Na+ has been higher recently.  3. Type 2 diabetes mellitus with other specified complication, without long-term current use of insulin (HCC) Anna Martinez is working on [employer], and she is due for labs.  4. Mixed hyperlipidemia Anna Martinez is not on statin. She is working on decreasing cholesterol in her diet. She is due to have labs.  5. At risk for heart disease Anna Martinez is at a higher than average risk for cardiovascular disease due to obesity.   Assessment/Plan:   1. Vitamin D deficiency Low Vitamin D level contributes to fatigue and are associated with obesity, breast, and colon cancer. We will check labs today, and Anna Martinez will follow-up for routine testing of Vitamin D, at least 2-3 times per year to avoid over-replacement.  - VITAMIN D 25 Hydroxy (Vit-D Deficiency, Fractures)  2. Essential hypertension Anna Martinez will decrease sodium, and continue her diet and medications to improve blood pressure control. We will recheck her blood pressure in 1 month.  3. Type 2 diabetes mellitus with other specified complication, without  long-term current use of insulin ([place]) We will check labs today, and Anna Martinez will continue with her diet and exercise. Good blood sugar control is important to decrease the likelihood of diabetic complications such as nephropathy, neuropathy, limb loss, blindness, coronary artery disease, and death. Intensive lifestyle modification including diet, exercise and weight loss are the first line of treatment for diabetes.   - Vitamin B12 - CMP14+EGFR - Insulin, random - Hemoglobin A1c - Microalbumin / creatinine urine ratio  4. Mixed hyperlipidemia Cardiovascular risk and specific lipid/LDL goals reviewed. We discussed several lifestyle modifications today. We will check labs today. Anna Martinez will continue with diet, exercise and weight loss efforts. Orders and follow up as documented in patient record.   - Lipid Panel With LDL/HDL Ratio  5. At risk for heart disease Anna Martinez was given approximately 15 minutes of coronary artery disease prevention counseling today. She is 60 y.o. female and has risk factors for heart disease including obesity. We discussed intensive lifestyle modifications today with an emphasis on specific weight loss instructions and strategies.   Repetitive spaced learning was employed today to elicit superior memory formation and behavioral change.  6. Obesity BMI today is 55 Anna Martinez is currently in the action stage of change. As such, her goal is to continue with weight loss efforts. She has agreed to the Category 4 Plan.   Behavioral modification strategies: holiday eating strategies .  Anna Martinez has agreed to follow-up with our clinic in 4 weeks. She was informed of the importance of frequent follow-up visits  to maximize her success with intensive lifestyle modifications for her multiple health conditions.   Anna Martinez was informed we would discuss her lab results at her next visit unless there is a critical issue that needs to be addressed sooner. Anna Martinez agreed to keep her next visit at the agreed  upon time to discuss these results.  Objective:   Blood pressure (!) 166/69, pulse 67, temperature 98.3 F (36.8 C), height '5\' 9"'  (1.753 m), weight (!) 332 lb (150.6 kg), last menstrual period 07/21/2015, SpO2 97 %. Body mass index is 49.03 kg/m.  General: Cooperative, alert, well developed, in no acute distress. HEENT: Conjunctivae and lids unremarkable. Cardiovascular: Regular rhythm.  Lungs: Normal work of breathing. Neurologic: No focal deficits.   Lab Results  Component Value Date   CREATININE 0.74 09/30/2020   BUN 14 09/30/2020   NA 138 09/30/2020   K 3.9 09/30/2020   CL 102 09/30/2020   CO2 27 09/30/2020   Lab Results  Component Value Date   ALT 20 04/07/2020   AST 19 04/07/2020   ALKPHOS 143 (H) 04/07/2020   BILITOT 0.6 04/07/2020   Lab Results  Component Value Date   HGBA1C 6.4 (H) 06/23/2020   HGBA1C 6.6 (H) 04/07/2020   Lab Results  Component Value Date   INSULIN 17.0 04/07/2020   Lab Results  Component Value Date   TSH 3.970 04/07/2020   Lab Results  Component Value Date   CHOL 202 (H) 04/07/2020   HDL 44 04/07/2020   LDLCALC 141 (H) 04/07/2020   TRIG 96 04/07/2020   Lab Results  Component Value Date   VD25OH 34.2 04/07/2020   Lab Results  Component Value Date   WBC 8.8 06/23/2020   HGB 13.9 06/23/2020   HCT 43.0 06/23/2020   MCV 86.9 06/23/2020   PLT 295 06/23/2020   No results found for: IRON, TIBC, FERRITIN  Attestation Statements:   Reviewed by clinician on day of visit: allergies, medications, problem list, medical history, surgical history, family history, social history, and previous encounter notes.   I, Trixie Dredge, am acting as transcriptionist for Dennard Nip, MD.  I have reviewed the above documentation for accuracy and completeness, and I agree with the above. -  Dennard Nip, MD

## 2021-01-20 LAB — CMP14+EGFR
ALT: 13 IU/L (ref 0–32)
AST: 13 IU/L (ref 0–40)
Albumin/Globulin Ratio: 2 (ref 1.2–2.2)
Albumin: 4.5 g/dL (ref 3.8–4.8)
Alkaline Phosphatase: 155 IU/L — ABNORMAL HIGH (ref 44–121)
BUN/Creatinine Ratio: 19 (ref 12–28)
BUN: 16 mg/dL (ref 8–27)
Bilirubin Total: 0.5 mg/dL (ref 0.0–1.2)
CO2: 24 mmol/L (ref 20–29)
Calcium: 9.1 mg/dL (ref 8.7–10.3)
Chloride: 100 mmol/L (ref 96–106)
Creatinine, Ser: 0.83 mg/dL (ref 0.57–1.00)
Globulin, Total: 2.3 g/dL (ref 1.5–4.5)
Glucose: 125 mg/dL — ABNORMAL HIGH (ref 70–99)
Potassium: 4.3 mmol/L (ref 3.5–5.2)
Sodium: 140 mmol/L (ref 134–144)
Total Protein: 6.8 g/dL (ref 6.0–8.5)
eGFR: 80 mL/min/{1.73_m2} (ref >59)

## 2021-01-20 LAB — LIPID PANEL WITH LDL/HDL RATIO
Cholesterol, Total: 216 mg/dL — ABNORMAL HIGH (ref 100–199)
HDL: 39 mg/dL — ABNORMAL LOW (ref >39)
LDL Chol Calc (NIH): 153 mg/dL — ABNORMAL HIGH (ref 0–99)
LDL/HDL Ratio: 3.9 ratio — ABNORMAL HIGH (ref 0.0–3.2)
Triglycerides: 132 mg/dL (ref 0–149)
VLDL Cholesterol Cal: 24 mg/dL (ref 5–40)

## 2021-01-20 LAB — INSULIN, RANDOM: INSULIN: 14.3 u[IU]/mL (ref 2.6–24.9)

## 2021-01-20 LAB — MICROALBUMIN / CREATININE URINE RATIO
Creatinine, Urine: 86.4 mg/dL
Microalb/Creat Ratio: <3 mg/g creat (ref 0–29)
Microalbumin, Urine: <3 ug/mL

## 2021-01-20 LAB — VITAMIN D 25 HYDROXY (VIT D DEFICIENCY, FRACTURES): Vit D, 25-Hydroxy: 46.5 ng/mL (ref 30.0–100.0)

## 2021-01-20 LAB — HEMOGLOBIN A1C
Est. average glucose Bld gHb Est-mCnc: 143 mg/dL
Hgb A1c MFr Bld: 6.6 % — ABNORMAL HIGH (ref 4.8–5.6)

## 2021-01-20 LAB — VITAMIN B12: Vitamin B-12: 451 pg/mL (ref 232–1245)

## 2021-01-22 NOTE — Progress Notes (Signed)
CLINIC:  Survivorship   Patient Care Team: Maurice Small, MD as PCP - General (Family Medicine) Rockwell Germany, RN as Oncology Nurse Navigator Mauro Kaufmann, RN as Oncology Nurse Navigator Truitt Merle, MD as Consulting Physician (Hematology) Jovita Kussmaul, MD as Consulting Physician (General Surgery) Kyung Rudd, MD as Consulting Physician (Radiation Oncology) Alla Feeling, NP as Nurse Practitioner (Nurse Practitioner)   REASON FOR VISIT:  Routine follow-up post-treatment for a recent history of breast cancer.  BRIEF ONCOLOGIC HISTORY:  Oncology History Overview Note  Cancer Staging Malignant neoplasm of lower-outer quadrant of left breast of female, estrogen receptor positive ([place]) Staging form: Breast, AJCC 8th Edition - Clinical stage from 05/10/2020: Stage IA (cT1c, cN0, cM0, G2, ER+, PR+, HER2-) - Signed by Gardenia Phlegm, NP on 05/18/2020 Histologic grading system: 3 grade system - Pathologic stage from 08/03/2020: Stage IA (pT2, pN0(sn), cM0, G2, ER+, PR+, HER2-, Oncotype DX score: 15) - Signed by Truitt Merle, MD on 09/14/2020 Method of lymph node assessment: Sentinel lymph node biopsy Multigene prognostic tests performed: Oncotype DX Recurrence score range: Greater than or equal to 11 Histologic grading system: 3 grade system    Malignant neoplasm of lower-outer quadrant of left breast of female, estrogen receptor positive ([place])  05/09/2020 Breast US   IMPRESSION: 1. Suspicious 1.9 cm mass with associated architectural distortion involving the LOWER OUTER QUADRANT of the LEFT breast at the 5 o'clock position approximately 3 cm from nipple. 2. No pathologic LEFT axillary lymphadenopathy.   05/10/2020 Cancer Staging   Staging form: Breast, AJCC 8th Edition - Clinical stage from 05/10/2020: Stage IA (cT1c, cN0, cM0, G2, ER+, PR+, HER2-) - Signed by Gardenia Phlegm, NP on 05/18/2020 Histologic grading system: 3 grade system    05/10/2020 Initial Biopsy    Diagnosis Breast, left, needle core biopsy, 5 o'clock - INVASIVE MAMMARY CARCINOMA - MAMMARY CARCINOMA IN-SITU - SEE COMMENT Microscopic Comment The biopsy material shows an infiltrative proliferation of cells arranged linearly and in small clusters. Based on the biopsy, the carcinoma appears Nottingham grade 2 of 3 and measures 1.5 cm in greatest linear extent. The tumor cells are NEGATIVE for Her2 (1+).  Estrogen Receptor: 80%, POSITIVE, STRONG-MODERATE STAINING INTENSITY Progesterone Receptor: 90%, POSITIVE, STRONG STAINING INTENSITY Proliferation Marker Ki67: 10% E-cadherin is NEGATIVE supporting lobular origin.   05/18/2020 Initial Diagnosis   Malignant neoplasm of lower-outer quadrant of left breast of female, estrogen receptor positive ([place])   05/31/2020 Imaging   Breast MRI IMPRESSION: 1. 1.5 cm heterogeneously enhancing, biopsy-proven, carcinoma in the lower slightly outer aspect of the left breast, at 5 o'clock. 2. No evidence of additional left breast carcinoma. 3. No evidence of right breast carcinoma. 4. No abnormal lymph nodes   06/06/2020 Genetic Testing   No pathogenic variants detected in [facility] CancerNext-Expanded +RNAinsight Panel.  The report date is Jun 13, 2020.  The CancerNext-Expanded gene panel offered by [facility] and includes sequencing, rearrangement, and RNA analysis for the following 77 genes: AIP, ALK, APC, ATM, AXIN2, BAP1, BARD1, BLM, BMPR1A, BRCA1, BRCA2, BRIP1, CDC73, CDH1, CDK4, CDKN1B, CDKN2A, CHEK2, CTNNA1, DICER1, FANCC, FH, FLCN, GALNT12, KIF1B, LZTR1, MAX, MEN1, MET, MLH1, MSH2, MSH3, MSH6, MUTYH, NBN, NF1, NF2, NTHL1, PALB2, PHOX2B, PMS2, POT1, PRKAR1A, PTCH1, PTEN, RAD51C, RAD51D, RB1, RECQL, RET, SDHA, SDHAF2, SDHB, SDHC, SDHD, SMAD4, SMARCA4, SMARCB1, SMARCE1, STK11, SUFU, TMEM127, TP53, TSC1, TSC2, VHL and XRCC2 (sequencing and deletion/duplication); EGFR, EGLN1, HOXB13, KIT, MITF, PDGFRA, POLD1, and POLE (sequencing only); EPCAM and GREM1  (deletion/duplication  only).    06/29/2020 Pathology Results   FINAL MICROSCOPIC DIAGNOSIS:   A. BREAST, LEFT, LUMPECTOMY:  - Invasive lobular carcinoma, grade 2, spanning 2.1 cm.  - Lobular carcinoma in situ.  - Biopsy site.  - Margins are negative for carcinoma.  - See oncology table.   B. LYMPH NODE, LEFT #1, SENTINEL, EXCISION:  - One of one lymph nodes negative for carcinoma (0/1).   C. LYMPH NODE, LEFT, SENTINEL, EXCISION:  - One of one lymph nodes negative for carcinoma (0/1).   D. LYMPH NODE, LEFT, SENTINEL, EXCISION:  - One of one lymph nodes negative for carcinoma (0/1).   E. LYMPH NODE, LEFT #2, SENTINEL, EXCISION:  - One of one lymph nodes negative for carcinoma (0/1).   06/29/2020 Oncotype testing   Recurrence score of 15 predicts a risk of recurrence outside the breast over the next 9 years of 4%, if the patient's only systemic therapy is an antiestrogen for 5 years.    08/03/2020 Cancer Staging   Staging form: Breast, AJCC 8th Edition - Pathologic stage from 08/03/2020: Stage IA (pT2, pN0(sn), cM0, G2, ER+, PR+, HER2-, Oncotype DX score: 15) - Signed by Truitt Merle, MD on 09/14/2020 Method of lymph node assessment: Sentinel lymph node biopsy Multigene prognostic tests performed: Oncotype DX Recurrence score range: Greater than or equal to 11 Histologic grading system: 3 grade system    01/23/2021 Survivorship   SCP delivered by Cira Rue, NP     INTERVAL HISTORY:  Anna Martinez presents to the [facility] today for our initial meeting to review her survivorship care plan detailing her treatment course for breast cancer, as well as monitoring long-term side effects of that treatment, education regarding health maintenance, screening, and overall wellness and health promotion.     Overall, [name] is doing mostly well.  She has arthritic pain in her knees and shoulders.  She cannot tolerate exemestane due to extreme hot flashes up to 20 times per  day/night.  I instructed her to stop that in November which she did, and restarted letrozole but only for 1 week due to similar hot flashes 16-17/day.  She stopped about 2 weeks ago.  Hot flashes are less frequent now, 3-4 during the day and 3-4 at night.  She wants to know the plan and her risk going forward if she elects not to take antiestrogen.  Otherwise she is doing well, left breast feels heavy and is still little dark, she was just prescribed a compression bra which she wears periodically.  Denies new lump/mass, nipple discharge or inversion, or skin change.  Bowels moving well, denies black or bloody stool.  Denies recent infection, cough, chest pain.  She is never had a DVT/PE.  Denies history of heart disease.  She is a non-smoker.   ONCOLOGY TREATMENT TEAM:  1. Surgeon:  Dr. Marlou Starks at [facility] 2. Medical Oncologist: Dr. Burr Medico  3. Radiation Oncologist: Dr. Lisbeth Renshaw    PAST MEDICAL/SURGICAL HISTORY:  Past Medical History:  Diagnosis Date   Abnormal Pap smear 08/13/2012   LSIL may include HPV or mild Dysplasia (CIN1)   Acute lateral meniscus tear of right knee, subsequent encounter    Breast cancer ([place]) 05/2020   LEFT   Depression    Family history of breast cancer 05/30/2020   Family history of pancreatic cancer 05/30/2020   Family history of prostate cancer 05/30/2020   Family history of renal cancer 05/30/2020   Family history of skin cancer 05/30/2020  Gallbladder problem    Heart murmur    cardiologist a long time ago ECHO not completed   Hypertension    on meds   Knee pain    Lateral meniscus tear    right knee   Obesity    Other fatigue    Pre-diabetes    Shortness of breath on exertion    Sleep apnea    uses CPAP nightly   Vitamin D deficiency    Past Surgical History:  Procedure Laterality Date   BREAST LUMPECTOMY WITH RADIOACTIVE SEED AND SENTINEL LYMPH NODE BIOPSY Left 06/29/2020   Procedure: LEFT BREAST LUMPECTOMY WITH RADIOACTIVE SEED;   Surgeon: Jovita Kussmaul, MD;  Location: [place];  Service: General;  Laterality: Left;   CYST REMOVAL TRUNK Left 06/29/2020   Procedure: REMOVAL OF SEBACEOUS CYST;  Surgeon: Megan Salon, MD;  Location: [place];  Service: Gynecology;  Laterality: Left;   HYSTEROSCOPY WITH D & C N/A 06/29/2020   Procedure: DILATATION AND CURETTAGE /HYSTEROSCOPY WITH MYOSURE;  Surgeon: Megan Salon, MD;  Location: [place];  Service: Gynecology;  Laterality: N/A;   KNEE ARTHROSCOPY WITH LATERAL MENISECTOMY Right 10/03/2020   Procedure: KNEE ARTHROSCOPY WITH LATERAL MENISECTOMY;  Surgeon: Elsie Saas, MD;  Location: [place];  Service: Orthopedics;  Laterality: Right;   LAPAROSCOPIC CHOLECYSTECTOMY  2001   SENTINEL NODE BIOPSY Left 06/29/2020   Procedure: LEFT SENTINEL LYMPH NODE BIOPSY;  Surgeon: Jovita Kussmaul, MD;  Location: [facility];  Service: General;  Laterality: Left;     ALLERGIES:  Allergies  Allergen Reactions   Bactrim [Sulfamethoxazole-Trimethoprim] Itching and Rash   Penicillins     ? Reaction as baby   Sulfa Antibiotics Rash     CURRENT MEDICATIONS:  Outpatient Encounter Medications as of 01/23/2021  Medication Sig   buPROPion (WELLBUTRIN XL) 300 MG 24 hr tablet Take 1 tablet (300 mg total) by mouth daily.   valsartan-hydrochlorothiazide (DIOVAN-HCT) 160-12.5 MG tablet Take 1 tablet by mouth daily.   VITAMIN D PO Take 1 tablet by mouth daily at 6 (six) AM.   [DISCONTINUED] mometasone (ELOCON) 0.1 % ointment Apply topically as directed once or twice weekly (Patient taking differently: Apply 1 application topically 2 (two) times daily as needed. Apply topically as directed once or twice weekly)   No facility-administered encounter medications on file as of 01/23/2021.     ONCOLOGIC FAMILY HISTORY:  Family History  Problem Relation Age of Onset   Hypertension Mother    Hyperlipidemia Mother    Heart disease Mother    Obesity Mother    Skin cancer Mother    Hypertension  Father    Obesity Father    Prostate cancer Father        dx 48s   Renal cancer Father        dx 32s   Cancer Father        mouth; dx after 51   Skin cancer Father        dx after 57   Melanoma Maternal Uncle        mets; dx 81s   Pancreatic cancer Paternal Aunt        dx 82s   Cancer Paternal Uncle        unknown type; dx unknown age   Skin cancer Maternal Grandmother        face   Cancer Paternal Grandmother        unknown type; ? pancreatic; dx after 60  Breast cancer Cousin        dx 66s; paternal cousin   Colon cancer Neg Hx    Colon polyps Neg Hx    Esophageal cancer Neg Hx    Rectal cancer Neg Hx    Stomach cancer Neg Hx      GENETIC COUNSELING/TESTING: Yes, 06/06/20 negative   SOCIAL HISTORY:  SARINITY DICICCO denies any current or history of tobacco, alcohol, or illicit drug use.     PHYSICAL EXAMINATION:  Vital Signs:   Vitals:   01/23/21 0944  BP: (!) 162/81  Pulse: 72  Resp: 17  Temp: 98.2 F (36.8 C)  SpO2: 98%   Filed Weights   01/23/21 0944  Weight: (!) 339 lb 8 oz (154 kg)   General: Well-nourished, well-appearing female in no acute distress.   HEENT:  Sclerae anicteric.  Lymph: No cervical, supraclavicular, or infraclavicular lymphadenopathy noted on palpation.  Respiratory:  breathing non-labored.  GI: Abdomen soft and round; non-tender, non-distended. Bowel sounds normoactive.  Neuro: No focal deficits. Steady gait.  Psych: Mood and affect normal and appropriate for situation.  Extremities: No edema. MSK: No focal spinal tenderness to palpation.  Full range of motion in bilateral upper extremities Skin: Warm and dry. Breast: breasts are symmetrical without nipple discharge or inversion. S/p left lumpectomy, incisions completely healed. No palpable mass in either breast or axilla that I could appreciate   LABORATORY DATA:  None for this visit.  DIAGNOSTIC IMAGING:  None for this visit.      ASSESSMENT AND PLAN:  Ms.. Bucker is a  pleasant 60 y.o. female with Stage IA left breast invasive lobular carcinoma, ER+/PR+/HER2-, diagnosed in 05/2020, treated with lumpectomy, adjuvant radiation therapy, and anti-estrogen therapy with Exemestane beginning in 09/2020 then letrozole in 12/2020, she is currently not on anti-estrogen therapy.  She presents to the Survivorship Clinic for our initial meeting and routine follow-up post-completion of treatment for breast cancer.    1. Stage IA left breast cancer:  Ms. Idler has recovered well from definitive treatment for breast cancer. She will follow-up with her medical oncologist, Dr. Burr Medico in 03/2021 with history and physical exam per surveillance protocol.  She did not tolerate exemestane or letrozole due to severe hot flashes and mild to moderate knee pain. Further anti-estrogen therapy is on hold for now. Today, a comprehensive survivorship care plan and treatment summary was reviewed with the patient today detailing her breast cancer diagnosis, treatment course, potential late/long-term effects of treatment, appropriate follow-up care with recommendations for the future, and patient education resources.  A copy of this summary, along with a letter will be sent to the patients primary care provider via In Basket message after todays visit.    2. Anti-estrogen therapy SE's: hot flashes, knee pain: Ms. Co developed severe frequent hot flashes 20/day on exemestane. She stopped that under my instruction and tried letrozole, only to develop similar HF's and knee pain after about 1 week. She stopped letrozole. We had a lengthy discussion about continuing anti-estrogen in the setting of lobular breast cancer with low oncotype. Despite her low recurrence score, lobular histology is assoc with higher risk of late recurrence so I do recommend to continue anti-estrogen if possible. We reviewed the following options and pt will think about how she'd like to proceed:  A. Restart exemestane at 1/2 dose per  day or full dose on modified MWF schedule. Consider adding gabapentin for hot flashes.   B. Switch to Tamoxifen, wean off Welbutrin due  to DDI, and start effexor or other acceptable alternative for mood and hot flashes. I discussed with Dr. Sabra Heck who felt she is not at further increased risk of endometrial cancer due to her h/o benign polyp. She would also be willing to help patient transition off welbutrin.   C. Stop anti-estrogen and proceed with surveillance alone  D. Possibly add breast MRI for additional screening, staggered 6 months apart from mammogram if she is not on anti-estrogen.    3. Bone health:  Given Ms. Kleiner age/history of breast cancer and her current treatment regimen including anti-estrogen therapy with exemestane/letrozole, she is at risk for bone demineralization.  Her last DEXA scan was 01/12/21, which showed T score -1.0, normal. We reviewed the bone strengthening quality of tamoxifen if she switches. I recommend once daily calcium/vitamin D and to increase her weight-bearing activities.  She was given education on specific activities to promote bone health.  4. Cancer screening:  Due to Ms. Ironside's history and her age, she should receive screening for skin cancers, colon cancer, and gynecologic cancers.  The information and recommendations are listed on the patient's comprehensive care plan/treatment summary and were reviewed in detail with the patient.    5. Health maintenance and wellness promotion: Ms. Rubano was encouraged to consume 5-7 servings of fruits and vegetables per day. We reviewed the "Nutrition Rainbow" handout. She was also encouraged to engage in moderate to vigorous exercise for 30 minutes per day most days of the week. She has limitations due to her knee pain. She was instructed to limit her alcohol consumption and continue to abstain from tobacco use.    6. Support services/counseling: It is not uncommon for this period of the patient's cancer care  trajectory to be one of many emotions and stressors.  We discussed an opportunity for her to participate in the next session of [facility] ("Finding Your New Normal") support group series designed for patients after they have completed treatment.   Ms. Bohl was encouraged to take advantage of our many other support services programs, support groups, and/or counseling in coping with her new life as a cancer survivor after completing anti-cancer treatment.  She was offered support today through active listening and expressive supportive counseling.  She was given information regarding our available services and encouraged to contact me with any questions or for help enrolling in any of our support group/programs.    Dispo:   -reviewed anti-estrogen therapy options as above, pt will let us know  -Return to cancer center 03/17/20  -Mammogram due in 04/2021 -Follow up with surgery as indicated -If she proceeds without anti-estrogen therapy, consider screening breast MRI staggered 6 months from mammo if insurance will approve  -She is welcome to return back to the Survivorship Clinic at any time; no additional follow-up needed at this time.  -Consider referral back to survivorship as a long-term survivor for continued surveillance  Orders Placed This Encounter  Procedures   MM DIAG BREAST TOMO BILATERAL    Standing Status:   Future    Standing Expiration Date:   01/24/2022    Order Specific Question:   Reason for Exam (SYMPTOM  OR DIAGNOSIS REQUIRED)    Answer:   h/o lobular breast cancer    Order Specific Question:   Preferred imaging location?    Answer:   [facility]     A total of (40) minutes of face-to-face time was spent with this patient with greater than 50% of that time in counseling and  care-coordination.   Cira Rue, NP Survivorship Program [facility] [phone]   Note: PRIMARY CARE PROVIDER Maurice Small, [place] [phone]

## 2021-01-23 ENCOUNTER — Other Ambulatory Visit: Payer: Self-pay

## 2021-01-23 ENCOUNTER — Other Ambulatory Visit (HOSPITAL_BASED_OUTPATIENT_CLINIC_OR_DEPARTMENT_OTHER): Payer: Self-pay | Admitting: *Deleted

## 2021-01-23 ENCOUNTER — Inpatient Hospital Stay: Payer: BC Managed Care – PPO | Attending: Nurse Practitioner | Admitting: Nurse Practitioner

## 2021-01-23 VITALS — BP 162/81 | HR 72 | Temp 98.2°F | Resp 17 | Ht 69.0 in | Wt 339.5 lb

## 2021-01-23 DIAGNOSIS — Z170 Estrogen receptor positive status [ER+]: Secondary | ICD-10-CM | POA: Insufficient documentation

## 2021-01-23 DIAGNOSIS — C50512 Malignant neoplasm of lower-outer quadrant of left female breast: Secondary | ICD-10-CM | POA: Insufficient documentation

## 2021-01-23 DIAGNOSIS — L900 Lichen sclerosus et atrophicus: Secondary | ICD-10-CM

## 2021-01-23 DIAGNOSIS — Z923 Personal history of irradiation: Secondary | ICD-10-CM | POA: Insufficient documentation

## 2021-01-23 DIAGNOSIS — Z79899 Other long term (current) drug therapy: Secondary | ICD-10-CM | POA: Insufficient documentation

## 2021-01-23 MED ORDER — MOMETASONE FUROATE 0.1 % EX OINT: TOPICAL_OINTMENT | CUTANEOUS | 0 refills | Status: DC

## 2021-01-24 ENCOUNTER — Encounter: Payer: Self-pay | Admitting: Nurse Practitioner

## 2021-02-03 ENCOUNTER — Other Ambulatory Visit: Payer: Self-pay | Admitting: Orthopedic Surgery

## 2021-02-03 ENCOUNTER — Ambulatory Visit (HOSPITAL_COMMUNITY)
Admission: RE | Admit: 2021-02-03 | Discharge: 2021-02-03 | Disposition: A | Discharge status: Home / Self Care | Payer: BC Managed Care – PPO | Source: Ambulatory Visit | Attending: Orthopedic Surgery | Admitting: Orthopedic Surgery

## 2021-02-03 ENCOUNTER — Other Ambulatory Visit: Payer: Self-pay | Admitting: Nephrology

## 2021-02-03 ENCOUNTER — Other Ambulatory Visit (HOSPITAL_COMMUNITY): Payer: Self-pay | Admitting: Orthopedic Surgery

## 2021-02-03 ENCOUNTER — Other Ambulatory Visit: Payer: Self-pay

## 2021-02-03 DIAGNOSIS — M7989 Other specified soft tissue disorders: Secondary | ICD-10-CM | POA: Diagnosis not present

## 2021-02-03 DIAGNOSIS — M79604 Pain in right leg: Secondary | ICD-10-CM | POA: Insufficient documentation

## 2021-02-03 NOTE — Progress Notes (Signed)
Lower extremity venous has been completed.   Preliminary results in CV Proc.   Jinny Blossom Zarrah [place] 02/03/2021 3:51 PM

## 2021-02-07 ENCOUNTER — Encounter: Payer: BC Managed Care – PPO | Admitting: Nurse Practitioner

## 2021-02-22 ENCOUNTER — Ambulatory Visit (INDEPENDENT_AMBULATORY_CARE_PROVIDER_SITE_OTHER): Payer: BC Managed Care – PPO | Admitting: Family Medicine

## 2021-03-13 ENCOUNTER — Other Ambulatory Visit: Payer: BC Managed Care – PPO

## 2021-03-14 ENCOUNTER — Telehealth: Payer: Self-pay | Admitting: Hematology

## 2021-03-14 NOTE — Telephone Encounter (Signed)
Sch per 2/7 inbasket, req to r/s per provider, pt asked to r/s in March, pt aware

## 2021-03-17 ENCOUNTER — Ambulatory Visit: Payer: BC Managed Care – PPO | Admitting: Hematology

## 2021-03-17 ENCOUNTER — Other Ambulatory Visit: Payer: BC Managed Care – PPO

## 2021-03-30 ENCOUNTER — Other Ambulatory Visit: Payer: Self-pay

## 2021-03-30 ENCOUNTER — Other Ambulatory Visit (HOSPITAL_COMMUNITY)
Admission: RE | Admit: 2021-03-30 | Discharge: 2021-03-30 | Disposition: A | Discharge status: Home / Self Care | Payer: BC Managed Care – PPO | Source: Ambulatory Visit | Attending: Obstetrics & Gynecology | Admitting: Obstetrics & Gynecology

## 2021-03-30 ENCOUNTER — Ambulatory Visit (INDEPENDENT_AMBULATORY_CARE_PROVIDER_SITE_OTHER): Payer: BC Managed Care – PPO | Admitting: Obstetrics & Gynecology

## 2021-03-30 ENCOUNTER — Encounter (HOSPITAL_BASED_OUTPATIENT_CLINIC_OR_DEPARTMENT_OTHER): Payer: Self-pay | Admitting: Obstetrics & Gynecology

## 2021-03-30 VITALS — BP 140/75 | HR 74 | Ht 69.0 in | Wt 337.8 lb

## 2021-03-30 DIAGNOSIS — Z124 Encounter for screening for malignant neoplasm of cervix: Secondary | ICD-10-CM

## 2021-03-30 DIAGNOSIS — L900 Lichen sclerosus et atrophicus: Secondary | ICD-10-CM

## 2021-03-30 DIAGNOSIS — Z01419 Encounter for gynecological examination (general) (routine) without abnormal findings: Secondary | ICD-10-CM | POA: Diagnosis not present

## 2021-03-30 DIAGNOSIS — C50512 Malignant neoplasm of lower-outer quadrant of left female breast: Secondary | ICD-10-CM

## 2021-03-30 DIAGNOSIS — Z9889 Other specified postprocedural states: Secondary | ICD-10-CM

## 2021-03-30 DIAGNOSIS — Z6841 Body Mass Index (BMI) 40.0 and over, adult: Secondary | ICD-10-CM

## 2021-03-30 DIAGNOSIS — Z170 Estrogen receptor positive status [ER+]: Secondary | ICD-10-CM

## 2021-03-30 MED ORDER — MOMETASONE FUROATE 0.1 % EX OINT: TOPICAL_OINTMENT | CUTANEOUS | 4 refills | Status: DC

## 2021-03-30 NOTE — Progress Notes (Signed)
61 y.o. G1P1 Significant Other White or Caucasian female here for annual exam.  Had lobular breast cancer treated with lumpectomy and radiation.  Has been on two aromatase inhibitors and has just not tolerated them well.  Has decided to stop.  Hot flashes are better.  Is considering Tamoxifen.  Wants to discuss this today.   Had two episodes of spotting this last year.  Had polyp resection last year.  Had a URI this winter and has coughed a lot.  Would like me to feel in one location to see if she has a hernia.    She is having some decreased libido as well.   Patient's last menstrual period was 07/21/2015.          Sexually active: Yes.    The current method of family planning is post menopausal status.    Exercising: Yes.     swimming Smoker:  no  Health Maintenance: Pap:  02/10/2018 Negative History of abnormal Pap:  LGSIL 2014 MMG:  04/12/2020  breast cancer was diagnosed Colonoscopy:  11/07/2020, follow up 5 years.  Dr. Tarri Glenn BMD:   01/12/2021 Screening Labs: 01/2021   reports that she has never smoked. She has never used smokeless tobacco. She reports that she does not drink alcohol and does not use drugs.  Past Medical History:  Diagnosis Date   Abnormal Pap smear 08/13/2012   LSIL may include HPV or mild Dysplasia (CIN1)   Acute lateral meniscus tear of right knee, subsequent encounter    Breast cancer ([place]) 05/2020   LEFT   Depression    Family history of breast cancer 05/30/2020   Family history of pancreatic cancer 05/30/2020   Family history of prostate cancer 05/30/2020   Family history of renal cancer 05/30/2020   Family history of skin cancer 05/30/2020   Gallbladder problem    Heart murmur    cardiologist a long time ago ECHO not completed   Hypertension    on meds   Knee pain    Lateral meniscus tear    right knee   Obesity    Other fatigue    Pre-diabetes    Shortness of breath on exertion    Sleep apnea    uses CPAP nightly   Vitamin D  deficiency     Past Surgical History:  Procedure Laterality Date   BREAST LUMPECTOMY WITH RADIOACTIVE SEED AND SENTINEL LYMPH NODE BIOPSY Left 06/29/2020   Procedure: LEFT BREAST LUMPECTOMY WITH RADIOACTIVE SEED;  Surgeon: Jovita Kussmaul, MD;  Location: [facility];  Service: General;  Laterality: Left;   CYST REMOVAL TRUNK Left 06/29/2020   Procedure: REMOVAL OF SEBACEOUS CYST;  Surgeon: Megan Salon, MD;  Location: [facility];  Service: Gynecology;  Laterality: Left;   HYSTEROSCOPY WITH D & C N/A 06/29/2020   Procedure: DILATATION AND CURETTAGE /HYSTEROSCOPY WITH MYOSURE;  Surgeon: Megan Salon, MD;  Location: [facility];  Service: Gynecology;  Laterality: N/A;   KNEE ARTHROSCOPY WITH LATERAL MENISECTOMY Right 10/03/2020   Procedure: KNEE ARTHROSCOPY WITH LATERAL MENISECTOMY;  Surgeon: Elsie Saas, MD;  Location: [facility];  Service: Orthopedics;  Laterality: Right;   LAPAROSCOPIC CHOLECYSTECTOMY  2001   SENTINEL NODE BIOPSY Left 06/29/2020   Procedure: LEFT SENTINEL LYMPH NODE BIOPSY;  Surgeon: Jovita Kussmaul, MD;  Location: [facility];  Service: General;  Laterality: Left;    Current Outpatient Medications  Medication Sig Dispense Refill   buPROPion (WELLBUTRIN XL) 300 MG 24 hr tablet Take 1 tablet (  300 mg total) by mouth daily. 90 tablet 1   mometasone (ELOCON) 0.1 % ointment Apply topically as directed once or twice weekly 45 g 0   valsartan-hydrochlorothiazide (DIOVAN-HCT) 160-12.5 MG tablet Take 1 tablet by mouth daily.     VITAMIN D PO Take 1 tablet by mouth daily at 6 (six) AM. (Patient not taking: Reported on 03/30/2021)     No current facility-administered medications for this visit.    Family History  Problem Relation Age of Onset   Hypertension Mother    Hyperlipidemia Mother    Heart disease Mother    Obesity Mother    Skin cancer Mother    Hypertension Father    Obesity Father    Prostate cancer Father        dx 14s   Renal cancer Father        dx 65s    Cancer Father        mouth; dx after 61   Skin cancer Father        dx after 68   Melanoma Maternal Uncle        mets; dx 27s   Pancreatic cancer Paternal Aunt        dx 67s   Cancer Paternal Uncle        unknown type; dx unknown age   Skin cancer Maternal Grandmother        face   Cancer Paternal Grandmother        unknown type; ? pancreatic; dx after 83   Breast cancer Cousin        dx 18s; paternal cousin   Colon cancer Neg Hx    Colon polyps Neg Hx    Esophageal cancer Neg Hx    Rectal cancer Neg Hx    Stomach cancer Neg Hx     Review of Systems  All other systems reviewed and are negative.  Exam:   BP 140/75 (BP Location: Right Arm, Patient Position: Sitting, Cuff Size: Large)    Pulse 74    Ht 5\' 9"  (1.753 m) Comment: reported   Wt (!) 337 lb 12.8 oz (153.2 kg)    LMP 07/21/2015    BMI 49.88 kg/m   Height: 5\' 9"  (175.3 cm) (reported)  General appearance: alert, cooperative and appears stated age Head: Normocephalic, without obvious abnormality, atraumatic Neck: no adenopathy, supple, symmetrical, trachea midline and thyroid normal to inspection and palpation Lungs: clear to auscultation bilaterally Breasts: normal appearance, no masses or tenderness Heart: regular rate and rhythm Abdomen: soft, non-tender; bowel sounds normal; no masses,  no organomegaly Extremities: extremities normal, atraumatic, no cyanosis or edema Skin: Skin color, texture, turgor normal. No rashes or lesions Lymph nodes: Cervical, supraclavicular, and axillary nodes normal. No abnormal inguinal nodes palpated Neurologic: Grossly normal   Pelvic: External genitalia:  no lesions, hypopigmentation has resolved              Urethra:  normal appearing urethra with no masses, tenderness or lesions              Bartholins and Skenes: normal                 Vagina: normal appearing vagina with normal color and no discharge, no lesions              Cervix: no lesions              Pap taken: Yes.    Bimanual Exam:  Uterus:  normal size, contour,  position, consistency, mobility, non-tender              Adnexa: normal adnexa and no mass, fullness, tenderness               Rectovaginal: Confirms               Anus:  normal sphincter tone, no lesions  Chaperone, Octaviano Batty, CMA, was present for exam.  Assessment/Plan: 1. Well woman exam with routine gynecological exam - Pap and HR HPV obtained today - Diagnostic MMG is scheduled for her screening - Colonoscopy 11/07/2020 with follow up 5 years recommended - BMD 01/12/2021 - lab work done 01/2021 - vaccines reviewed/updated  2. Cervical cancer screening - Cytology - PAP( [facility])  3. Lichen sclerosus et atrophicus - skin looks really good today.  Continue 1 -2 times weekly use for maintenance - mometasone (ELOCON) 0.1 % ointment; Apply topically as directed once or twice weekly  Dispense: 45 g; Refill: 4  4. Malignant neoplasm of lower-outer quadrant of left breast of female, estrogen receptor positive ([place]) - has oncology follow up  5. BMI 50.0-59.9, adult ([place])  6. History of hysteroscopy and polyp resection - pt knows to call with any future bleeding.  Would order ultrasound at that time

## 2021-04-03 ENCOUNTER — Ambulatory Visit: Payer: BC Managed Care – PPO | Attending: General Surgery

## 2021-04-03 ENCOUNTER — Other Ambulatory Visit: Payer: Self-pay

## 2021-04-03 VITALS — Wt 338.5 lb

## 2021-04-03 DIAGNOSIS — Z483 Aftercare following surgery for neoplasm: Secondary | ICD-10-CM | POA: Insufficient documentation

## 2021-04-03 LAB — CYTOLOGY - PAP
Adequacy: ABSENT
Comment: NEGATIVE
Diagnosis: NEGATIVE
High risk HPV: NEGATIVE

## 2021-04-03 NOTE — Therapy (Signed)
[facility] @ [address], [place], [address] Phone: [phone]   Fax:  [phone]  Physical Therapy Treatment  Patient Details  Name: Anna Martinez MRN: [id number] Date of Birth: November 11, 1960 Referring Provider (PT): Dr. Marlou Starks   Encounter Date: 04/03/2021   PT End of Session - 04/03/21 1558     Visit Number 2   # unchanged due to screen only   PT Start Time 3888    PT Stop Time 1600    PT Time Calculation (min) 4 min    Behavior During Therapy [facility] for tasks assessed/performed             Past Medical History:  Diagnosis Date   Abnormal Pap smear 08/13/2012   LSIL may include HPV or mild Dysplasia (CIN1)   Acute lateral meniscus tear of right knee, subsequent encounter    Breast cancer ([place]) 05/2020   LEFT   Depression    Family history of breast cancer 05/30/2020   Family history of pancreatic cancer 05/30/2020   Family history of prostate cancer 05/30/2020   Family history of renal cancer 05/30/2020   Family history of skin cancer 05/30/2020   Gallbladder problem    Heart murmur    cardiologist a long time ago ECHO not completed   Hypertension    on meds   Knee pain    Lateral meniscus tear    right knee   Obesity    Other fatigue    Pre-diabetes    Shortness of breath on exertion    Sleep apnea    uses CPAP nightly   Vitamin D deficiency     Past Surgical History:  Procedure Laterality Date   BREAST LUMPECTOMY WITH RADIOACTIVE SEED AND SENTINEL LYMPH NODE BIOPSY Left 06/29/2020   Procedure: LEFT BREAST LUMPECTOMY WITH RADIOACTIVE SEED;  Surgeon: Jovita Kussmaul, MD;  Location: [facility];  Service: General;  Laterality: Left;   CYST REMOVAL TRUNK Left 06/29/2020   Procedure: REMOVAL OF SEBACEOUS CYST;  Surgeon: Megan Salon, MD;  Location: [facility];  Service: Gynecology;  Laterality: Left;   HYSTEROSCOPY WITH D & C N/A 06/29/2020   Procedure: DILATATION AND CURETTAGE /HYSTEROSCOPY WITH MYOSURE;   Surgeon: Megan Salon, MD;  Location: [facility];  Service: Gynecology;  Laterality: N/A;   KNEE ARTHROSCOPY WITH LATERAL MENISECTOMY Right 10/03/2020   Procedure: KNEE ARTHROSCOPY WITH LATERAL MENISECTOMY;  Surgeon: Elsie Saas, MD;  Location: [facility];  Service: Orthopedics;  Laterality: Right;   LAPAROSCOPIC CHOLECYSTECTOMY  2001   SENTINEL NODE BIOPSY Left 06/29/2020   Procedure: LEFT SENTINEL LYMPH NODE BIOPSY;  Surgeon: Jovita Kussmaul, MD;  Location: [facility];  Service: General;  Laterality: Left;    Vitals:   04/03/21 1557  Weight: (!) 338 lb 8 oz (153.5 kg)     Subjective Assessment - 04/03/21 1557     Subjective Pt returns for her 3 month L-Dex screen.    Pertinent History Pt was diagnosed in March with a routine mammogram.  She is now surgery on 06/29/20 for left  lumpectomy with SLNB. She also had a vulvar sebaceous cyst and endometrial polyp removed at the same time by Dr. Sabra Heck.                    L-DEX FLOWSHEETS - 04/03/21 1500       L-DEX LYMPHEDEMA SCREENING   Measurement Type Unilateral    L-DEX MEASUREMENT EXTREMITY Upper  Extremity    POSITION  Standing    DOMINANT SIDE Right    At Risk Side Left    BASELINE SCORE (UNILATERAL) 3.4    L-DEX SCORE (UNILATERAL) 6.7    VALUE CHANGE (UNILAT) 3.3                                     PT Long Term Goals - 07/20/20 1304       PT LONG TERM GOAL #1   Title Pt will demonstrate full AROM of left shoulder post surgically    Period Weeks    Status Achieved    Target Date 07/20/20                   Plan - 04/03/21 1558     Clinical Impression Statement Pt returns for her 3 month L-Dex screen. Her change from baseline of is WNLs so no further treatment is required  at this time except to cont every 3 month L-Dex screens which pt is agreeable to.    PT Next Visit Plan Cont every 3 month L-Dex screens for up to 2 years from her SLNB (~06/30/22)    Consulted  and Agree with Plan of Care Patient             Patient will benefit from skilled therapeutic intervention in order to improve the following deficits and impairments:     Visit Diagnosis: Aftercare following surgery for neoplasm     Problem List Patient Active Problem List   Diagnosis Date Noted   Acute lateral meniscus tear of right knee, subsequent encounter 09/29/2020   Postmenopausal bleeding    Endometrial polyp    Vulvar cyst    Diabetes mellitus ([place]) 06/22/2020   Genetic testing 06/06/2020   Family history of breast cancer 05/30/2020   Family history of prostate cancer 05/30/2020   Family history of skin cancer 05/30/2020   Family history of pancreatic cancer 05/30/2020   Family history of renal cancer 05/30/2020   Malignant neoplasm of lower-outer quadrant of left breast of female, estrogen receptor positive ([place]) 05/18/2020   Hyperlipidemia 03/24/2020   Mixed hyperlipidemia 03/24/2020   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

## 2021-04-10 NOTE — Progress Notes (Signed)
[place]   [phone] Fax:([phone]   Clinic Follow up Note   Patient Care Team: Maurice Small, MD as PCP - General (Family Medicine) Rockwell Germany, RN as Oncology Nurse Navigator Mauro Kaufmann, RN as Oncology Nurse Navigator Truitt Merle, MD as Consulting Physician (Hematology) Jovita Kussmaul, MD as Consulting Physician (General Surgery) Kyung Rudd, MD as Consulting Physician (Radiation Oncology) Alla Feeling, NP as Nurse Practitioner (Nurse Practitioner) 04/11/2021  CHIEF COMPLAINT: Follow-up lobular left breast cancer  SUMMARY OF ONCOLOGIC HISTORY: Oncology History Overview Note  Cancer Staging Malignant neoplasm of lower-outer quadrant of left breast of female, estrogen receptor positive ([place]) Staging form: Breast, AJCC 8th Edition - Clinical stage from 05/10/2020: Stage IA (cT1c, cN0, cM0, G2, ER+, PR+, HER2-) - Signed by Gardenia Phlegm, NP on 05/18/2020 Histologic grading system: 3 grade system - Pathologic stage from 08/03/2020: Stage IA (pT2, pN0(sn), cM0, G2, ER+, PR+, HER2-, Oncotype DX score: 15) - Signed by Truitt Merle, MD on 09/14/2020 Method of lymph node assessment: Sentinel lymph node biopsy Multigene prognostic tests performed: Oncotype DX Recurrence score range: Greater than or equal to 11 Histologic grading system: 3 grade system    Malignant neoplasm of lower-outer quadrant of left breast of female, estrogen receptor positive ([place])  05/09/2020 Breast US   IMPRESSION: 1. Suspicious 1.9 cm mass with associated architectural distortion involving the LOWER OUTER QUADRANT of the LEFT breast at the 5 o'clock position approximately 3 cm from nipple. 2. No pathologic LEFT axillary lymphadenopathy.   05/10/2020 Cancer Staging   Staging form: Breast, AJCC 8th Edition - Clinical stage from 05/10/2020: Stage IA (cT1c, cN0, cM0, G2, ER+, PR+, HER2-) - Signed by Gardenia Phlegm, NP on 05/18/2020 Histologic grading system: 3  grade system    05/10/2020 Initial Biopsy   Diagnosis Breast, left, needle core biopsy, 5 o'clock - INVASIVE MAMMARY CARCINOMA - MAMMARY CARCINOMA IN-SITU - SEE COMMENT Microscopic Comment The biopsy material shows an infiltrative proliferation of cells arranged linearly and in small clusters. Based on the biopsy, the carcinoma appears Nottingham grade 2 of 3 and measures 1.5 cm in greatest linear extent. The tumor cells are NEGATIVE for Her2 (1+).  Estrogen Receptor: 80%, POSITIVE, STRONG-MODERATE STAINING INTENSITY Progesterone Receptor: 90%, POSITIVE, STRONG STAINING INTENSITY Proliferation Marker Ki67: 10% E-cadherin is NEGATIVE supporting lobular origin.   05/18/2020 Initial Diagnosis   Malignant neoplasm of lower-outer quadrant of left breast of female, estrogen receptor positive ([place])   05/31/2020 Imaging   Breast MRI IMPRESSION: 1. 1.5 cm heterogeneously enhancing, biopsy-proven, carcinoma in the lower slightly outer aspect of the left breast, at 5 o'clock. 2. No evidence of additional left breast carcinoma. 3. No evidence of right breast carcinoma. 4. No abnormal lymph nodes   06/06/2020 Genetic Testing   No pathogenic variants detected in [facility] CancerNext-Expanded +RNAinsight Panel.  The report date is Jun 13, 2020.  The CancerNext-Expanded gene panel offered by [facility] and includes sequencing, rearrangement, and RNA analysis for the following 77 genes: AIP, ALK, APC, ATM, AXIN2, BAP1, BARD1, BLM, BMPR1A, BRCA1, BRCA2, BRIP1, CDC73, CDH1, CDK4, CDKN1B, CDKN2A, CHEK2, CTNNA1, DICER1, FANCC, FH, FLCN, GALNT12, KIF1B, LZTR1, MAX, MEN1, MET, MLH1, MSH2, MSH3, MSH6, MUTYH, NBN, NF1, NF2, NTHL1, PALB2, PHOX2B, PMS2, POT1, PRKAR1A, PTCH1, PTEN, RAD51C, RAD51D, RB1, RECQL, RET, SDHA, SDHAF2, SDHB, SDHC, SDHD, SMAD4, SMARCA4, SMARCB1, SMARCE1, STK11, SUFU, TMEM127, TP53, TSC1, TSC2, VHL and XRCC2 (sequencing and deletion/duplication); EGFR, EGLN1, HOXB13, KIT, MITF, PDGFRA, POLD1, and  POLE (sequencing  only); EPCAM and GREM1 (deletion/duplication only).    06/29/2020 Pathology Results   FINAL MICROSCOPIC DIAGNOSIS:   A. BREAST, LEFT, LUMPECTOMY:  - Invasive lobular carcinoma, grade 2, spanning 2.1 cm.  - Lobular carcinoma in situ.  - Biopsy site.  - Margins are negative for carcinoma.  - See oncology table.   B. LYMPH NODE, LEFT #1, SENTINEL, EXCISION:  - One of one lymph nodes negative for carcinoma (0/1).   C. LYMPH NODE, LEFT, SENTINEL, EXCISION:  - One of one lymph nodes negative for carcinoma (0/1).   D. LYMPH NODE, LEFT, SENTINEL, EXCISION:  - One of one lymph nodes negative for carcinoma (0/1).   E. LYMPH NODE, LEFT #2, SENTINEL, EXCISION:  - One of one lymph nodes negative for carcinoma (0/1).   06/29/2020 Oncotype testing   Recurrence score of 15 predicts a risk of recurrence outside the breast over the next 9 years of 4%, if the patient's only systemic therapy is an antiestrogen for 5 years.    08/03/2020 Cancer Staging   Staging form: Breast, AJCC 8th Edition - Pathologic stage from 08/03/2020: Stage IA (pT2, pN0(sn), cM0, G2, ER+, PR+, HER2-, Oncotype DX score: 15) - Signed by Truitt Merle, MD on 09/14/2020 Method of lymph node assessment: Sentinel lymph node biopsy Multigene prognostic tests performed: Oncotype DX Recurrence score range: Greater than or equal to 11 Histologic grading system: 3 grade system    08/18/2020 - 09/06/2020 Radiation Therapy   Completed adjuvant radiation per Dr. Lisbeth Renshaw   11/2020 - 12/2020 Anti-estrogen oral therapy   Did not tolerate exemestane or letrozole due to severe hot flashes and knee pain   01/23/2021 Survivorship   SCP delivered by Cira Rue, NP     CURRENT THERAPY: Currently on surveillance alone, did not tolerate AI's well, considering tamoxifen  INTERVAL HISTORY: Anna Martinez returns for follow-up as scheduled, last seen by me 01/23/2021.  She recently had a breast and GYN exam by Dr. Hale Bogus at her visit  03/30/2021.  She denies any breast concerns.  She is here today to discuss starting tamoxifen.  She was told she can just stop Wellbutrin without weaning, last dose 3/4.  She feels well, mood is good.  Hot flashes have nearly resolved off AI's.  She has mild residual knee pain from arthritis, but mobile and functional.  She has gained weight she attributes to inadvertently skipping BP med for the last few days.  All other systems were reviewed with the patient and are negative.  MEDICAL HISTORY:  Past Medical History:  Diagnosis Date   Abnormal Pap smear 08/13/2012   LSIL may include HPV or mild Dysplasia (CIN1)   Acute lateral meniscus tear of right knee, subsequent encounter    Breast cancer ([place]) 05/2020   LEFT   Depression    Family history of breast cancer 05/30/2020   Family history of pancreatic cancer 05/30/2020   Family history of prostate cancer 05/30/2020   Family history of renal cancer 05/30/2020   Family history of skin cancer 05/30/2020   Gallbladder problem    Heart murmur    cardiologist a long time ago ECHO not completed   Hypertension    on meds   Knee pain    Lateral meniscus tear    right knee   Obesity    Other fatigue    Pre-diabetes    Shortness of breath on exertion    Sleep apnea    uses CPAP nightly   Vitamin D deficiency  SURGICAL HISTORY: Past Surgical History:  Procedure Laterality Date   BREAST LUMPECTOMY WITH RADIOACTIVE SEED AND SENTINEL LYMPH NODE BIOPSY Left 06/29/2020   Procedure: LEFT BREAST LUMPECTOMY WITH RADIOACTIVE SEED;  Surgeon: Jovita Kussmaul, MD;  Location: [place];  Service: General;  Laterality: Left;   CYST REMOVAL TRUNK Left 06/29/2020   Procedure: REMOVAL OF SEBACEOUS CYST;  Surgeon: Megan Salon, MD;  Location: [place];  Service: Gynecology;  Laterality: Left;   HYSTEROSCOPY WITH D & C N/A 06/29/2020   Procedure: DILATATION AND CURETTAGE /HYSTEROSCOPY WITH MYOSURE;  Surgeon: Megan Salon, MD;  Location: [place];  Service:  Gynecology;  Laterality: N/A;   KNEE ARTHROSCOPY WITH LATERAL MENISECTOMY Right 10/03/2020   Procedure: KNEE ARTHROSCOPY WITH LATERAL MENISECTOMY;  Surgeon: Elsie Saas, MD;  Location: [place];  Service: Orthopedics;  Laterality: Right;   LAPAROSCOPIC CHOLECYSTECTOMY  2001   SENTINEL NODE BIOPSY Left 06/29/2020   Procedure: LEFT SENTINEL LYMPH NODE BIOPSY;  Surgeon: Jovita Kussmaul, MD;  Location: [place];  Service: General;  Laterality: Left;    I have reviewed the social history and family history with the patient and they are unchanged from previous note.  ALLERGIES:  is allergic to bactrim [sulfamethoxazole-trimethoprim], penicillins, and sulfa antibiotics.  MEDICATIONS:  Current Outpatient Medications  Medication Sig Dispense Refill   tamoxifen (NOLVADEX) 20 MG tablet Take 1 tablet (20 mg total) by mouth daily. 30 tablet 3   tamoxifen (NOLVADEX) 20 MG tablet Take 1 tablet (20 mg total) by mouth daily. 30 tablet 3   valsartan-hydrochlorothiazide (DIOVAN-HCT) 160-12.5 MG tablet Take 1 tablet by mouth daily.     mometasone (ELOCON) 0.1 % ointment Apply topically as directed once or twice weekly 45 g 4   VITAMIN D PO Take 1 tablet by mouth daily at 6 (six) AM. (Patient not taking: Reported on 03/30/2021)     No current facility-administered medications for this visit.    PHYSICAL EXAMINATION:  Vitals:   04/11/21 1004  BP: (!) 168/88  Pulse: 73  Temp: 98.2 F (36.8 C)  SpO2: 98%   Filed Weights   04/11/21 1004  Weight: (!) 344 lb 4.8 oz (156.2 kg)    GENERAL:alert, no distress and comfortable SKIN: No rash LUNGS:  normal breathing effort NEURO: alert & oriented x 3 with fluent speech, no focal motor deficits Breast exam deferred  LABORATORY DATA:  I have reviewed the data as listed CBC Latest Ref Rng & Units 04/11/2021 06/23/2020 04/07/2020  WBC 4.0 - 10.5 K/uL 7.1 8.8 9.0  Hemoglobin 12.0 - 15.0 g/dL 13.2 13.9 14.5  Hematocrit 36.0 - 46.0 % 39.4 43.0  44.6  Platelets 150 - 400 K/uL 256 295 316     CMP Latest Ref Rng & Units 04/11/2021 01/19/2021 09/30/2020  Glucose 70 - 99 mg/dL 151(H) 125(H) 108(H)  BUN 8 - 23 mg/dL '14 16 14  ' Creatinine 0.44 - 1.00 mg/dL 0.72 0.83 0.74  Sodium 135 - 145 mmol/L 140 140 138  Potassium 3.5 - 5.1 mmol/L 3.9 4.3 3.9  Chloride 98 - 111 mmol/L 107 100 102  CO2 22 - 32 mmol/L '28 24 27  ' Calcium 8.9 - 10.3 mg/dL 9.2 9.1 8.9  Total Protein 6.5 - 8.1 g/dL 6.8 6.8 -  Total Bilirubin 0.3 - 1.2 mg/dL 0.5 0.5 -  Alkaline Phos 38 - 126 U/L 121 155(H) -  AST 15 - 41 U/L 13(L) 13 -  ALT 0 - 44 U/L 15 13 -  RADIOGRAPHIC STUDIES: I have personally reviewed the radiological images as listed and agreed with the findings in the report. No results found.   ASSESSMENT & PLAN: Anna Martinez is a 61 y.o. female with    1. Malignant neoplasm in left lower outer breast - invasive lobular carcinoma, grade II, ER/PR positive HER2 negative, Ki67 10%, pT2N0M0, stage I -Diagnosed 05/10/2020 with invasive lobular carcinoma s/p lumpectomy on 06/29/20 by Dr. Marlou Starks. Pathology showed 2.1 cm invasive and in situ lobular carcinoma, margins and lymph nodes negative. Oncotype recurrence score of 15, low risk.  Adjuvant chemotherapy is not recommended. -She completed adjuvant radiation therapy 09/14/2020 by Dr. Lisbeth Renshaw.   -Given the strong ER and PR expression in her postmenopausal status, adjuvant endocrine therapy with AI was recommended for a total of 5-10 years to reduce the risk of cancer recurrence. she did not tolerate exemestane or letrozole due to arthritis and severe hot flashes, she stopped in 12/2020.  -in the meantime she has been considering tamoxifen, getting well female breast and GYN exam with Dr. Sabra Heck, and stopped Wellbutrin 04/08/2021.  She agrees to try tamoxifen -Plan to start in 1-2 weeks, she will monitor carefully for side effects.  If she does not tolerate well, we will proceed with surveillance alone -She is scheduled  for bilateral mammogram 04/13/2021 -Next visit in 3 months, or sooner if needed.   2. Abnormal vaginal bleeding -onset 05/2020, mostly post-coital from ob/gyn notes -endometrium curettage performed during lumpectomy showed benign pathology. -We reviewed the increased risk of endometrial cancer on tamoxifen, this polyp does not further increase that risk per Dr. Sabra Heck   3. Genetics -patient has personal history of sq.cell skin and now breast cancer. Her father had multiple cancers (prostate, kidney, skin, and palate), and she has family history of pancreas cancer and breast cancer in 76 year old cousin.  -testing 05/30/20 was negative   4. HTN, HL, DM, arthritis -per PCP Dr. 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 Health maintenance -last pap 02/10/18 negative, endometrium curettage 06/29/20 benign -has not had colonoscopy, her's PCP has referred her to GI. -she is under care of Dr. Jearld Lesch for medical weight management   Plan: -Labs reviewed -Stopped Wellbutrin 04/08/2021 per OB/GYN -Begin tamoxifen in 1-2 weeks, reviewed potential SEs -Encouraged healthy active lifestyle, weight loss, avoiding smoking, limiting alcohol -Follow-up in 3 months for surveillance and toxicity check, patient prefers in person visits -Mammo 04/13/2021 as scheduled -She knows to call sooner with any new or worsening side effects or concerns  All questions were answered. The patient knows to call the clinic with any problems, questions or concerns. No barriers to learning was detected. I spent 20 minutes counseling the patient face to face. The total time spent in the appointment was 30 minutes and more than 50% was on counseling and review of test results     Alla Feeling, NP 04/11/21

## 2021-04-11 ENCOUNTER — Other Ambulatory Visit: Payer: Self-pay

## 2021-04-11 ENCOUNTER — Inpatient Hospital Stay (HOSPITAL_BASED_OUTPATIENT_CLINIC_OR_DEPARTMENT_OTHER): Payer: BC Managed Care – PPO | Admitting: Nurse Practitioner

## 2021-04-11 ENCOUNTER — Inpatient Hospital Stay: Payer: BC Managed Care – PPO | Attending: Nurse Practitioner

## 2021-04-11 ENCOUNTER — Encounter: Payer: Self-pay | Admitting: Nurse Practitioner

## 2021-04-11 VITALS — BP 168/88 | HR 73 | Temp 98.2°F | Wt 344.3 lb

## 2021-04-11 DIAGNOSIS — C50512 Malignant neoplasm of lower-outer quadrant of left female breast: Secondary | ICD-10-CM | POA: Diagnosis present

## 2021-04-11 DIAGNOSIS — Z170 Estrogen receptor positive status [ER+]: Secondary | ICD-10-CM | POA: Diagnosis not present

## 2021-04-11 DIAGNOSIS — Z79810 Long term (current) use of selective estrogen receptor modulators (SERMs): Secondary | ICD-10-CM | POA: Diagnosis not present

## 2021-04-11 DIAGNOSIS — Z923 Personal history of irradiation: Secondary | ICD-10-CM | POA: Insufficient documentation

## 2021-04-11 DIAGNOSIS — Z79899 Other long term (current) drug therapy: Secondary | ICD-10-CM | POA: Diagnosis not present

## 2021-04-11 LAB — CMP (CANCER CENTER ONLY)
ALT: 15 U/L (ref 0–44)
AST: 13 U/L — ABNORMAL LOW (ref 15–41)
Albumin: 4 g/dL (ref 3.5–5.0)
Alkaline Phosphatase: 121 U/L (ref 38–126)
Anion gap: 5 (ref 5–15)
BUN: 14 mg/dL (ref 8–23)
CO2: 28 mmol/L (ref 22–32)
Calcium: 9.2 mg/dL (ref 8.9–10.3)
Chloride: 107 mmol/L (ref 98–111)
Creatinine: 0.72 mg/dL (ref 0.44–1.00)
GFR, Estimated: >60 mL/min (ref >60)
Glucose, Bld: 151 mg/dL — ABNORMAL HIGH (ref 70–99)
Potassium: 3.9 mmol/L (ref 3.5–5.1)
Sodium: 140 mmol/L (ref 135–145)
Total Bilirubin: 0.5 mg/dL (ref 0.3–1.2)
Total Protein: 6.8 g/dL (ref 6.5–8.1)

## 2021-04-11 LAB — CBC WITH DIFFERENTIAL (CANCER CENTER ONLY)
Abs Immature Granulocytes: 0.02 10*3/uL (ref 0.00–0.07)
Basophils Absolute: 0 10*3/uL (ref 0.0–0.1)
Basophils Relative: 0 %
Eosinophils Absolute: 0.2 10*3/uL (ref 0.0–0.5)
Eosinophils Relative: 3 %
HCT: 39.4 % (ref 36.0–46.0)
Hemoglobin: 13.2 g/dL (ref 12.0–15.0)
Immature Granulocytes: 0 %
Lymphocytes Relative: 21 %
Lymphs Abs: 1.5 10*3/uL (ref 0.7–4.0)
MCH: 28.1 pg (ref 26.0–34.0)
MCHC: 33.5 g/dL (ref 30.0–36.0)
MCV: 83.8 fL (ref 80.0–100.0)
Monocytes Absolute: 0.6 10*3/uL (ref 0.1–1.0)
Monocytes Relative: 8 %
Neutro Abs: 4.8 10*3/uL (ref 1.7–7.7)
Neutrophils Relative %: 68 %
Platelet Count: 256 10*3/uL (ref 150–400)
RBC: 4.7 MIL/uL (ref 3.87–5.11)
RDW: 14.2 % (ref 11.5–15.5)
WBC Count: 7.1 10*3/uL (ref 4.0–10.5)
nRBC: 0 % (ref 0.0–0.2)

## 2021-04-11 MED ORDER — TAMOXIFEN CITRATE 20 MG PO TABS: 20.0000 mg | ORAL_TABLET | Freq: Every day | ORAL | 3 refills | Status: DC

## 2021-04-12 ENCOUNTER — Telehealth: Payer: Self-pay | Admitting: Hematology

## 2021-04-12 NOTE — Telephone Encounter (Signed)
Scheduled follow-up appointment per 3/7 los. Patient is aware. ?

## 2021-05-04 ENCOUNTER — Other Ambulatory Visit: Payer: Self-pay | Admitting: Nurse Practitioner

## 2021-05-04 ENCOUNTER — Ambulatory Visit
Admission: RE | Admit: 2021-05-04 | Discharge: 2021-05-04 | Disposition: A | Discharge status: Home / Self Care | Payer: BC Managed Care – PPO | Source: Ambulatory Visit | Attending: Nurse Practitioner | Admitting: Nurse Practitioner

## 2021-05-04 DIAGNOSIS — N61 Mastitis without abscess: Secondary | ICD-10-CM

## 2021-05-04 DIAGNOSIS — C50512 Malignant neoplasm of lower-outer quadrant of left female breast: Secondary | ICD-10-CM

## 2021-05-04 HISTORY — DX: Personal history of irradiation: Z92.3

## 2021-05-10 ENCOUNTER — Encounter (HOSPITAL_COMMUNITY): Payer: Self-pay

## 2021-05-11 ENCOUNTER — Ambulatory Visit (INDEPENDENT_AMBULATORY_CARE_PROVIDER_SITE_OTHER): Payer: BC Managed Care – PPO | Admitting: Family Medicine

## 2021-07-10 ENCOUNTER — Ambulatory Visit (INDEPENDENT_AMBULATORY_CARE_PROVIDER_SITE_OTHER): Payer: BC Managed Care – PPO | Admitting: Family Medicine

## 2021-07-10 ENCOUNTER — Ambulatory Visit: Payer: BC Managed Care – PPO

## 2021-07-31 ENCOUNTER — Inpatient Hospital Stay: Payer: BC Managed Care – PPO | Admitting: Hematology

## 2021-07-31 DIAGNOSIS — C50512 Malignant neoplasm of lower-outer quadrant of left female breast: Secondary | ICD-10-CM

## 2021-08-11 ENCOUNTER — Other Ambulatory Visit: Payer: Self-pay

## 2021-08-11 ENCOUNTER — Other Ambulatory Visit: Payer: Self-pay | Admitting: Hematology

## 2021-08-11 ENCOUNTER — Inpatient Hospital Stay: Payer: BC Managed Care – PPO | Attending: Hematology | Admitting: Hematology

## 2021-08-11 ENCOUNTER — Encounter: Payer: Self-pay | Admitting: Hematology

## 2021-08-11 VITALS — BP 138/66 | HR 81 | Temp 98.4°F | Resp 18 | Ht 69.0 in | Wt 340.9 lb

## 2021-08-11 DIAGNOSIS — C50512 Malignant neoplasm of lower-outer quadrant of left female breast: Secondary | ICD-10-CM | POA: Insufficient documentation

## 2021-08-11 DIAGNOSIS — Z79810 Long term (current) use of selective estrogen receptor modulators (SERMs): Secondary | ICD-10-CM | POA: Insufficient documentation

## 2021-08-11 DIAGNOSIS — Z170 Estrogen receptor positive status [ER+]: Secondary | ICD-10-CM | POA: Diagnosis not present

## 2021-08-11 MED ORDER — TAMOXIFEN CITRATE 20 MG PO TABS: 20.0000 mg | ORAL_TABLET | Freq: Every day | ORAL | 1 refills | Status: DC

## 2021-08-11 NOTE — Progress Notes (Signed)
[name]   [phone] [phone]   Clinic Follow up Note   Patient Care Team: Maurice Small, MD as PCP - General (Family Medicine) Rockwell Germany, RN as Oncology Nurse Navigator Mauro Kaufmann, RN as Oncology Nurse Navigator Truitt Merle, MD as Consulting Physician (Hematology) Jovita Kussmaul, MD as Consulting Physician (General Surgery) Kyung Rudd, MD as Consulting Physician (Radiation Oncology) Alla Feeling, NP as Nurse Practitioner (Nurse Practitioner)  Date of Service:  08/11/2021  CHIEF COMPLAINT: f/u of left breast cancer  CURRENT THERAPY:  Tamoxifen, started 04/2021  ASSESSMENT & PLAN:  Anna Martinez is a 61 y.o. post-menopausal female with   1. Malignant neoplasm in left lower outer breast - invasive lobular carcinoma, grade II, ER+/PR+ HER2-, pT2N0M0, stage I -diagnosed 05/10/20, s/p lumpectomy on 06/29/20 by Dr. Marlou Starks. Pathology showed 2.1 cm invasive and in situ lobular carcinoma, margins and lymph nodes negative. Oncotype recurrence score of 15, low risk.  -s/p adjuvant radiation 08/18/20 - 09/14/20 -she did not tolerate exemestane or letrozole due to arthritis and severe hot flashes, she stopped in 12/2020. She started tamoxifen in 04/2021; she is tolerating well with manageable hot flashes. -she presented to annual mammogram on 05/04/21 with left breast erythema and tenderness. MM showed mastitis or radiation changes to left breast. She was given a course of doxycycline, and her symptoms resolved. Short-term f/u MM was not recommended given resolution of her symptoms. -she is clinically doing very well. Physical exam was unremarkable except for some mild LE edema. I reviewed the slightly elevated risk of blood clots on tamoxifen and advised her to elevate her legs when sitting. -plan to see her back in 5 months, as she is seeing GYN in 03/2022.   2. Genetics -patient has personal history of sq.cell skin and now breast cancer. Her father had  multiple cancers (prostate, kidney, skin, and palate), and she has family history of pancreas cancer and breast cancer in 31 year old cousin.  -testing 05/30/20 was negative   3. HTN, HL, DM, arthritis -per PCP Dr. Justin Mend -experienced worsening arthralgia on AI, improved off AI and not worsened on tamoxifen   4. Health maintenance -last pap 03/30/21 negative, endometrium curettage 06/29/20 benign -colonoscopy on 11/07/20 by Dr. Tarri Glenn showed only some internal hemorrhoids, diverticulosis, and two tiny polyps, which were benign. Recall 2032.    Plan: -continue tamoxifen -lab and f/u in 5 months   No problem-specific Assessment & Plan notes found for this encounter.   SUMMARY OF ONCOLOGIC HISTORY: Oncology History Overview Note  Cancer Staging Malignant neoplasm of lower-outer quadrant of left breast of female, estrogen receptor positive ([place]) Staging form: Breast, AJCC 8th Edition - Clinical stage from 05/10/2020: Stage IA (cT1c, cN0, cM0, G2, ER+, PR+, HER2-) - Signed by Gardenia Phlegm, NP on 05/18/2020 Histologic grading system: 3 grade system - Pathologic stage from 08/03/2020: Stage IA (pT2, pN0(sn), cM0, G2, ER+, PR+, HER2-, Oncotype DX score: 15) - Signed by Truitt Merle, MD on 09/14/2020 Method of lymph node assessment: Sentinel lymph node biopsy Multigene prognostic tests performed: Oncotype DX Recurrence score range: Greater than or equal to 11 Histologic grading system: 3 grade system    Malignant neoplasm of lower-outer quadrant of left breast of female, estrogen receptor positive ([place])  05/09/2020 Breast US   IMPRESSION: 1. Suspicious 1.9 cm mass with associated architectural distortion involving the LOWER OUTER QUADRANT of the LEFT breast at the 5 o'clock position approximately 3 cm from nipple. 2.  No pathologic LEFT axillary lymphadenopathy.   05/10/2020 Cancer Staging   Staging form: Breast, AJCC 8th Edition - Clinical stage from 05/10/2020: Stage IA (cT1c, cN0, cM0,  G2, ER+, PR+, HER2-) - Signed by Gardenia Phlegm, NP on 05/18/2020 Histologic grading system: 3 grade system   05/10/2020 Initial Biopsy   Diagnosis Breast, left, needle core biopsy, 5 o'clock - INVASIVE MAMMARY CARCINOMA - MAMMARY CARCINOMA IN-SITU - SEE COMMENT Microscopic Comment The biopsy material shows an infiltrative proliferation of cells arranged linearly and in small clusters. Based on the biopsy, the carcinoma appears Nottingham grade 2 of 3 and measures 1.5 cm in greatest linear extent. The tumor cells are NEGATIVE for Her2 (1+).  Estrogen Receptor: 80%, POSITIVE, STRONG-MODERATE STAINING INTENSITY Progesterone Receptor: 90%, POSITIVE, STRONG STAINING INTENSITY Proliferation Marker Ki67: 10% E-cadherin is NEGATIVE supporting lobular origin.   05/18/2020 Initial Diagnosis   Malignant neoplasm of lower-outer quadrant of left breast of female, estrogen receptor positive ([place])   05/31/2020 Imaging   Breast MRI IMPRESSION: 1. 1.5 cm heterogeneously enhancing, biopsy-proven, carcinoma in the lower slightly outer aspect of the left breast, at 5 o'clock. 2. No evidence of additional left breast carcinoma. 3. No evidence of right breast carcinoma. 4. No abnormal lymph nodes   06/06/2020 Genetic Testing   No pathogenic variants detected in [facility] CancerNext-Expanded +RNAinsight Panel.  The report date is Jun 13, 2020.  The CancerNext-Expanded gene panel offered by [facility] and includes sequencing, rearrangement, and RNA analysis for the following 77 genes: AIP, ALK, APC, ATM, AXIN2, BAP1, BARD1, BLM, BMPR1A, BRCA1, BRCA2, BRIP1, CDC73, CDH1, CDK4, CDKN1B, CDKN2A, CHEK2, CTNNA1, DICER1, FANCC, FH, FLCN, GALNT12, KIF1B, LZTR1, MAX, MEN1, MET, MLH1, MSH2, MSH3, MSH6, MUTYH, NBN, NF1, NF2, NTHL1, PALB2, PHOX2B, PMS2, POT1, PRKAR1A, PTCH1, PTEN, RAD51C, RAD51D, RB1, RECQL, RET, SDHA, SDHAF2, SDHB, SDHC, SDHD, SMAD4, SMARCA4, SMARCB1, SMARCE1, STK11, SUFU, TMEM127, TP53, TSC1, TSC2,  VHL and XRCC2 (sequencing and deletion/duplication); EGFR, EGLN1, HOXB13, KIT, MITF, PDGFRA, POLD1, and POLE (sequencing only); EPCAM and GREM1 (deletion/duplication only).    06/29/2020 Pathology Results   FINAL MICROSCOPIC DIAGNOSIS:   A. BREAST, LEFT, LUMPECTOMY:  - Invasive lobular carcinoma, grade 2, spanning 2.1 cm.  - Lobular carcinoma in situ.  - Biopsy site.  - Margins are negative for carcinoma.  - See oncology table.   B. LYMPH NODE, LEFT #1, SENTINEL, EXCISION:  - One of one lymph nodes negative for carcinoma (0/1).   C. LYMPH NODE, LEFT, SENTINEL, EXCISION:  - One of one lymph nodes negative for carcinoma (0/1).   D. LYMPH NODE, LEFT, SENTINEL, EXCISION:  - One of one lymph nodes negative for carcinoma (0/1).   E. LYMPH NODE, LEFT #2, SENTINEL, EXCISION:  - One of one lymph nodes negative for carcinoma (0/1).   06/29/2020 Oncotype testing   Recurrence score of 15 predicts a risk of recurrence outside the breast over the next 9 years of 4%, if the patient's only systemic therapy is an antiestrogen for 5 years.    08/03/2020 Cancer Staging   Staging form: Breast, AJCC 8th Edition - Pathologic stage from 08/03/2020: Stage IA (pT2, pN0(sn), cM0, G2, ER+, PR+, HER2-, Oncotype DX score: 15) - Signed by Truitt Merle, MD on 09/14/2020 Method of lymph node assessment: Sentinel lymph node biopsy Multigene prognostic tests performed: Oncotype DX Recurrence score range: Greater than or equal to 11 Histologic grading system: 3 grade system   08/18/2020 - 09/06/2020 Radiation Therapy   Completed adjuvant radiation per Dr. Lisbeth Renshaw   11/2020 -  12/2020 Anti-estrogen oral therapy   Did not tolerate exemestane or letrozole due to severe hot flashes and knee pain   01/23/2021 Survivorship   SCP delivered by Cira Rue, NP      INTERVAL HISTORY:  Anna Martinez is here for a follow up of breast cancer. She was last seen by NP Lacie on 04/11/21. She presents to the clinic alone. She reports  she is doing well overall. She notes she is tolerating tamoxifen much better than AI-- she explains she has no arthralgias and the hot flashes are much more manageable.   All other systems were reviewed with the patient and are negative.  MEDICAL HISTORY:  Past Medical History:  Diagnosis Date   Abnormal Pap smear 08/13/2012   LSIL may include HPV or mild Dysplasia (CIN1)   Acute lateral meniscus tear of right knee, subsequent encounter    Breast cancer ([place]) 05/2020   LEFT   Depression    Family history of breast cancer 05/30/2020   Family history of pancreatic cancer 05/30/2020   Family history of prostate cancer 05/30/2020   Family history of renal cancer 05/30/2020   Family history of skin cancer 05/30/2020   Gallbladder problem    Heart murmur    cardiologist a long time ago ECHO not completed   Hypertension    on meds   Knee pain    Lateral meniscus tear    right knee   Obesity    Other fatigue    Personal history of radiation therapy    Pre-diabetes    Shortness of breath on exertion    Sleep apnea    uses CPAP nightly   Vitamin D deficiency     SURGICAL HISTORY: Past Surgical History:  Procedure Laterality Date   BREAST LUMPECTOMY     BREAST LUMPECTOMY WITH RADIOACTIVE SEED AND SENTINEL LYMPH NODE BIOPSY Left 06/29/2020   Procedure: LEFT BREAST LUMPECTOMY WITH RADIOACTIVE SEED;  Surgeon: Jovita Kussmaul, MD;  Location: [facility];  Service: General;  Laterality: Left;   CYST REMOVAL TRUNK Left 06/29/2020   Procedure: REMOVAL OF SEBACEOUS CYST;  Surgeon: Megan Salon, MD;  Location: [facility];  Service: Gynecology;  Laterality: Left;   HYSTEROSCOPY WITH D & C N/A 06/29/2020   Procedure: DILATATION AND CURETTAGE /HYSTEROSCOPY WITH MYOSURE;  Surgeon: Megan Salon, MD;  Location: [facility];  Service: Gynecology;  Laterality: N/A;   KNEE ARTHROSCOPY WITH LATERAL MENISECTOMY Right 10/03/2020   Procedure: KNEE ARTHROSCOPY WITH LATERAL MENISECTOMY;  Surgeon: Elsie Saas, MD;   Location: [facility];  Service: Orthopedics;  Laterality: Right;   LAPAROSCOPIC CHOLECYSTECTOMY  2001   SENTINEL NODE BIOPSY Left 06/29/2020   Procedure: LEFT SENTINEL LYMPH NODE BIOPSY;  Surgeon: Jovita Kussmaul, MD;  Location: [facility];  Service: General;  Laterality: Left;    I have reviewed the social history and family history with the patient and they are unchanged from previous note.  ALLERGIES:  is allergic to bactrim [sulfamethoxazole-trimethoprim], penicillins, and sulfa antibiotics.  MEDICATIONS:  Current Outpatient Medications  Medication Sig Dispense Refill   mometasone (ELOCON) 0.1 % ointment Apply topically as directed once or twice weekly 45 g 4   tamoxifen (NOLVADEX) 20 MG tablet Take 1 tablet (20 mg total) by mouth daily. 30 tablet 3   tamoxifen (NOLVADEX) 20 MG tablet Take 1 tablet (20 mg total) by mouth daily. 90 tablet 1   valsartan-hydrochlorothiazide (DIOVAN-HCT) 160-12.5 MG tablet Take 1 tablet by mouth daily.  VITAMIN D PO Take 1 tablet by mouth daily at 6 (six) AM. (Patient not taking: Reported on 03/30/2021)     No current facility-administered medications for this visit.    PHYSICAL EXAMINATION: ECOG PERFORMANCE STATUS: 0 - Asymptomatic  Vitals:   08/11/21 0832  BP: 138/66  Pulse: 81  Resp: 18  Temp: 98.4 F (36.9 C)  SpO2: 98%   Wt Readings from Last 3 Encounters:  08/11/21 (!) 340 lb 14.4 oz (154.6 kg)  04/11/21 (!) 344 lb 4.8 oz (156.2 kg)  04/03/21 (!) 338 lb 8 oz (153.5 kg)     GENERAL:alert, no distress and comfortable SKIN: skin color, texture, turgor are normal, no rashes or significant lesions EYES: normal, Conjunctiva are pink and non-injected, sclera clear  NECK: supple, thyroid normal size, non-tender, without nodularity LYMPH:  no palpable lymphadenopathy in the cervical, axillary LUNGS: clear to auscultation and percussion with normal breathing effort HEART: regular rate & rhythm and no murmurs, (+) mild lower  extremity edema ABDOMEN:abdomen soft, non-tender and normal bowel sounds Musculoskeletal:no cyanosis of digits and no clubbing  NEURO: alert & oriented x 3 with fluent speech, no focal motor/sensory deficits BREAST: No palpable mass, nodules or adenopathy bilaterally. Breast exam benign.   LABORATORY DATA:  I have reviewed the data as listed    Latest Ref Rng & Units 04/11/2021    9:52 AM 06/23/2020    3:00 PM 04/07/2020    9:46 AM  CBC  WBC 4.0 - 10.5 K/uL 7.1  8.8  9.0   Hemoglobin 12.0 - 15.0 g/dL 13.2  13.9  14.5   Hematocrit 36.0 - 46.0 % 39.4  43.0  44.6   Platelets 150 - 400 K/uL 256  295  316         Latest Ref Rng & Units 04/11/2021    9:52 AM 01/19/2021    8:34 AM 09/30/2020    7:39 AM  CMP  Glucose 70 - 99 mg/dL 151  125  108   BUN 8 - 23 mg/dL '14  16  14   ' Creatinine 0.44 - 1.00 mg/dL 0.72  0.83  0.74   Sodium 135 - 145 mmol/L 140  140  138   Potassium 3.5 - 5.1 mmol/L 3.9  4.3  3.9   Chloride 98 - 111 mmol/L 107  100  102   CO2 22 - 32 mmol/L '28  24  27   ' Calcium 8.9 - 10.3 mg/dL 9.2  9.1  8.9   Total Protein 6.5 - 8.1 g/dL 6.8  6.8    Total Bilirubin 0.3 - 1.2 mg/dL 0.5  0.5    Alkaline Phos 38 - 126 U/L 121  155    AST 15 - 41 U/L 13  13    ALT 0 - 44 U/L 15  13        RADIOGRAPHIC STUDIES: I have personally reviewed the radiological images as listed and agreed with the findings in the report. No results found.    No orders of the defined types were placed in this encounter.  All questions were answered. The patient knows to call the clinic with any problems, questions or concerns. No barriers to learning was detected. The total time spent in the appointment was 30 minutes.     Truitt Merle, MD 08/11/2021   I, Wilburn Mylar, am acting as scribe for Truitt Merle, MD.   I have reviewed the above documentation for accuracy and completeness, and I agree with the above.

## 2021-08-12 ENCOUNTER — Other Ambulatory Visit: Payer: Self-pay | Admitting: Nurse Practitioner

## 2021-08-12 DIAGNOSIS — C50512 Malignant neoplasm of lower-outer quadrant of left female breast: Secondary | ICD-10-CM

## 2021-09-13 ENCOUNTER — Encounter (INDEPENDENT_AMBULATORY_CARE_PROVIDER_SITE_OTHER): Payer: Self-pay

## 2021-10-31 ENCOUNTER — Encounter (HOSPITAL_BASED_OUTPATIENT_CLINIC_OR_DEPARTMENT_OTHER): Payer: Self-pay | Admitting: Obstetrics & Gynecology

## 2021-10-31 ENCOUNTER — Encounter: Payer: Self-pay | Admitting: Nurse Practitioner

## 2022-01-11 ENCOUNTER — Encounter: Payer: Self-pay | Admitting: Nurse Practitioner

## 2022-01-14 NOTE — Progress Notes (Unsigned)
[place]   [phone] [phone]   Clinic Follow up Note   Patient Care Team: Maurice Small, MD as PCP - General (Family Medicine) Rockwell Germany, RN as Oncology Nurse Navigator Mauro Kaufmann, RN as Oncology Nurse Navigator Truitt Merle, MD as Consulting Physician (Hematology) Jovita Kussmaul, MD as Consulting Physician (General Surgery) Kyung Rudd, MD as Consulting Physician (Radiation Oncology) Alla Feeling, NP as Nurse Practitioner (Nurse Practitioner) 01/15/2022  CHIEF COMPLAINT: Follow up left breast cancer   SUMMARY OF ONCOLOGIC HISTORY: Oncology History Overview Note  Cancer Staging Malignant neoplasm of lower-outer quadrant of left breast of female, estrogen receptor positive ([place]) Staging form: Breast, AJCC 8th Edition - Clinical stage from 05/10/2020: Stage IA (cT1c, cN0, cM0, G2, ER+, PR+, HER2-) - Signed by Gardenia Phlegm, NP on 05/18/2020 Histologic grading system: 3 grade system - Pathologic stage from 08/03/2020: Stage IA (pT2, pN0(sn), cM0, G2, ER+, PR+, HER2-, Oncotype DX score: 15) - Signed by Truitt Merle, MD on 09/14/2020 Method of lymph node assessment: Sentinel lymph node biopsy Multigene prognostic tests performed: Oncotype DX Recurrence score range: Greater than or equal to 11 Histologic grading system: 3 grade system    Malignant neoplasm of lower-outer quadrant of left breast of female, estrogen receptor positive ([place])  05/09/2020 Breast US   IMPRESSION: 1. Suspicious 1.9 cm mass with associated architectural distortion involving the LOWER OUTER QUADRANT of the LEFT breast at the 5 o'clock position approximately 3 cm from nipple. 2. No pathologic LEFT axillary lymphadenopathy.   05/10/2020 Cancer Staging   Staging form: Breast, AJCC 8th Edition - Clinical stage from 05/10/2020: Stage IA (cT1c, cN0, cM0, G2, ER+, PR+, HER2-) - Signed by Gardenia Phlegm, NP on 05/18/2020 Histologic grading system: 3 grade  system   05/10/2020 Initial Biopsy   Diagnosis Breast, left, needle core biopsy, 5 o'clock - INVASIVE MAMMARY CARCINOMA - MAMMARY CARCINOMA IN-SITU - SEE COMMENT Microscopic Comment The biopsy material shows an infiltrative proliferation of cells arranged linearly and in small clusters. Based on the biopsy, the carcinoma appears Nottingham grade 2 of 3 and measures 1.5 cm in greatest linear extent. The tumor cells are NEGATIVE for Her2 (1+).  Estrogen Receptor: 80%, POSITIVE, STRONG-MODERATE STAINING INTENSITY Progesterone Receptor: 90%, POSITIVE, STRONG STAINING INTENSITY Proliferation Marker Ki67: 10% E-cadherin is NEGATIVE supporting lobular origin.   05/18/2020 Initial Diagnosis   Malignant neoplasm of lower-outer quadrant of left breast of female, estrogen receptor positive ([place])   05/31/2020 Imaging   Breast MRI IMPRESSION: 1. 1.5 cm heterogeneously enhancing, biopsy-proven, carcinoma in the lower slightly outer aspect of the left breast, at 5 o'clock. 2. No evidence of additional left breast carcinoma. 3. No evidence of right breast carcinoma. 4. No abnormal lymph nodes   06/06/2020 Genetic Testing   No pathogenic variants detected in [facility] CancerNext-Expanded +RNAinsight Panel.  The report date is Jun 13, 2020.  The CancerNext-Expanded gene panel offered by [facility] and includes sequencing, rearrangement, and RNA analysis for the following 77 genes: AIP, ALK, APC, ATM, AXIN2, BAP1, BARD1, BLM, BMPR1A, BRCA1, BRCA2, BRIP1, CDC73, CDH1, CDK4, CDKN1B, CDKN2A, CHEK2, CTNNA1, DICER1, FANCC, FH, FLCN, GALNT12, KIF1B, LZTR1, MAX, MEN1, MET, MLH1, MSH2, MSH3, MSH6, MUTYH, NBN, NF1, NF2, NTHL1, PALB2, PHOX2B, PMS2, POT1, PRKAR1A, PTCH1, PTEN, RAD51C, RAD51D, RB1, RECQL, RET, SDHA, SDHAF2, SDHB, SDHC, SDHD, SMAD4, SMARCA4, SMARCB1, SMARCE1, STK11, SUFU, TMEM127, TP53, TSC1, TSC2, VHL and XRCC2 (sequencing and deletion/duplication); EGFR, EGLN1, HOXB13, KIT, MITF, PDGFRA, POLD1, and POLE  (sequencing  only); EPCAM and GREM1 (deletion/duplication only).    06/29/2020 Pathology Results   FINAL MICROSCOPIC DIAGNOSIS:   A. BREAST, LEFT, LUMPECTOMY:  - Invasive lobular carcinoma, grade 2, spanning 2.1 cm.  - Lobular carcinoma in situ.  - Biopsy site.  - Margins are negative for carcinoma.  - See oncology table.   B. LYMPH NODE, LEFT #1, SENTINEL, EXCISION:  - One of one lymph nodes negative for carcinoma (0/1).   C. LYMPH NODE, LEFT, SENTINEL, EXCISION:  - One of one lymph nodes negative for carcinoma (0/1).   D. LYMPH NODE, LEFT, SENTINEL, EXCISION:  - One of one lymph nodes negative for carcinoma (0/1).   E. LYMPH NODE, LEFT #2, SENTINEL, EXCISION:  - One of one lymph nodes negative for carcinoma (0/1).   06/29/2020 Oncotype testing   Recurrence score of 15 predicts a risk of recurrence outside the breast over the next 9 years of 4%, if the patient's only systemic therapy is an antiestrogen for 5 years.    08/03/2020 Cancer Staging   Staging form: Breast, AJCC 8th Edition - Pathologic stage from 08/03/2020: Stage IA (pT2, pN0(sn), cM0, G2, ER+, PR+, HER2-, Oncotype DX score: 15) - Signed by Truitt Merle, MD on 09/14/2020 Method of lymph node assessment: Sentinel lymph node biopsy Multigene prognostic tests performed: Oncotype DX Recurrence score range: Greater than or equal to 11 Histologic grading system: 3 grade system   08/18/2020 - 09/06/2020 Radiation Therapy   Completed adjuvant radiation per Dr. Lisbeth Renshaw   11/2020 - 12/2020 Anti-estrogen oral therapy   Did not tolerate exemestane or letrozole due to severe hot flashes and knee pain   01/23/2021 Survivorship   SCP delivered by Cira Rue, NP     CURRENT THERAPY: Tamoxifen, started 04/2021  INTERVAL HISTORY: Anna Martinez returns for follow up as scheduled. Last seen by Dr. Burr Medico 08/11/21. She continues tamoxifen, tolerating well with mild hot flashes, but better than on AI, and joint aches in her knees. She has gained  some weight back, needs to get back to pool exercise. She would like to avoid weight loss medication if possible. Mood is good. She still has mild pain in her left breast at time, but denies new lump/mass, nipple discharge/inversion, or skin change. Mammogram due late March.   All other systems were reviewed with the patient and are negative.  MEDICAL HISTORY:  Past Medical History:  Diagnosis Date   Abnormal Pap smear 08/13/2012   LSIL may include HPV or mild Dysplasia (CIN1)   Acute lateral meniscus tear of right knee, subsequent encounter    Breast cancer ([place]) 05/2020   LEFT   Depression    Family history of breast cancer 05/30/2020   Family history of pancreatic cancer 05/30/2020   Family history of prostate cancer 05/30/2020   Family history of renal cancer 05/30/2020   Family history of skin cancer 05/30/2020   Gallbladder problem    Heart murmur    cardiologist a long time ago ECHO not completed   Hypertension    on meds   Knee pain    Lateral meniscus tear    right knee   Obesity    Other fatigue    Personal history of radiation therapy    Pre-diabetes    Shortness of breath on exertion    Sleep apnea    uses CPAP nightly   Vitamin D deficiency     SURGICAL HISTORY: Past Surgical History:  Procedure Laterality Date   BREAST LUMPECTOMY  BREAST LUMPECTOMY WITH RADIOACTIVE SEED AND SENTINEL LYMPH NODE BIOPSY Left 06/29/2020   Procedure: LEFT BREAST LUMPECTOMY WITH RADIOACTIVE SEED;  Surgeon: Jovita Kussmaul, MD;  Location: [place];  Service: General;  Laterality: Left;   CYST REMOVAL TRUNK Left 06/29/2020   Procedure: REMOVAL OF SEBACEOUS CYST;  Surgeon: Megan Salon, MD;  Location: [place];  Service: Gynecology;  Laterality: Left;   HYSTEROSCOPY WITH D & C N/A 06/29/2020   Procedure: DILATATION AND CURETTAGE /HYSTEROSCOPY WITH MYOSURE;  Surgeon: Megan Salon, MD;  Location: [place];  Service: Gynecology;  Laterality: N/A;   KNEE ARTHROSCOPY WITH LATERAL  MENISECTOMY Right 10/03/2020   Procedure: KNEE ARTHROSCOPY WITH LATERAL MENISECTOMY;  Surgeon: Elsie Saas, MD;  Location: [place];  Service: Orthopedics;  Laterality: Right;   LAPAROSCOPIC CHOLECYSTECTOMY  2001   SENTINEL NODE BIOPSY Left 06/29/2020   Procedure: LEFT SENTINEL LYMPH NODE BIOPSY;  Surgeon: Jovita Kussmaul, MD;  Location: [place];  Service: General;  Laterality: Left;    I have reviewed the social history and family history with the patient and they are unchanged from previous note.  ALLERGIES:  is allergic to bactrim [sulfamethoxazole-trimethoprim], penicillins, and sulfa antibiotics.  MEDICATIONS:  Current Outpatient Medications  Medication Sig Dispense Refill   mometasone (ELOCON) 0.1 % ointment Apply topically as directed once or twice weekly 45 g 4   tamoxifen (NOLVADEX) 20 MG tablet Take 1 tablet (20 mg total) by mouth daily. 90 tablet 1   valsartan-hydrochlorothiazide (DIOVAN-HCT) 320-25 MG tablet Take 1 tablet by mouth daily.     VITAMIN D PO Take 1 tablet by mouth daily at 6 (six) AM.     No current facility-administered medications for this visit.    PHYSICAL EXAMINATION: ECOG PERFORMANCE STATUS: 1 - Symptomatic but completely ambulatory  Vitals:   01/15/22 1058 01/15/22 1110  BP: (!) 166/85 (!) 140/80  Pulse: (!) 57   Resp: 19   Temp: 98.6 F (37 C)   SpO2: 98%    Filed Weights   01/15/22 1058  Weight: (!) 340 lb 6.4 oz (154.4 kg)    GENERAL:alert, no distress and comfortable SKIN: No rash EYES:  sclera clear NECK: Without mass LYMPH:  no palpable cervical or supraclavicular lymphadenopathy  LUNGS: normal breathing effort HEART: no lower extremity edema ABDOMEN:abdomen soft, non-tender and normal bowel sounds NEURO: alert & oriented x 3 with fluent speech, no focal motor/sensory deficits Breast exam: No nipple discharge or inversion.  S/p left lumpectomy and radiation, incisions completely healed with some scar tissue and  mild angioectasia under the breast.  No palpable mass or nodularity in either breast or axilla that I could appreciate.  LABORATORY DATA:  I have reviewed the data as listed    Latest Ref Rng & Units 04/11/2021    9:52 AM 06/23/2020    3:00 PM 04/07/2020    9:46 AM  CBC  WBC 4.0 - 10.5 K/uL 7.1  8.8  9.0   Hemoglobin 12.0 - 15.0 g/dL 13.2  13.9  14.5   Hematocrit 36.0 - 46.0 % 39.4  43.0  44.6   Platelets 150 - 400 K/uL 256  295  316         Latest Ref Rng & Units 04/11/2021    9:52 AM 01/19/2021    8:34 AM 09/30/2020    7:39 AM  CMP  Glucose 70 - 99 mg/dL 151  125  108   BUN 8 - 23 mg/dL 14  16  14   Creatinine 0.44 - 1.00 mg/dL 0.72  0.83  0.74   Sodium 135 - 145 mmol/L 140  140  138   Potassium 3.5 - 5.1 mmol/L 3.9  4.3  3.9   Chloride 98 - 111 mmol/L 107  100  102   CO2 22 - 32 mmol/L _0 Calcium 8.9 - 10.3 mg/dL 9.2  9.1  8.9   Total Protein 6.5 - 8.1 g/dL 6.8  6.8    Total Bilirubin 0.3 - 1.2 mg/dL 0.5  0.5    Alkaline Phos 38 - 126 U/L 121  155    AST 15 - 41 U/L 13  13    ALT 0 - 44 U/L 15  13      Labs reviewed from PCP 12/08/2021 showing a normal 27.29 (25), normal CMP, and vitamin D level of 24.3  RADIOGRAPHIC STUDIES: I have personally reviewed the radiological images as listed and agreed with the findings in the report. No results found.   ASSESSMENT & PLAN: Anna Martinez is a 61 y.o. female with    1. Malignant neoplasm in left lower outer breast - invasive lobular carcinoma, grade II, ER/PR positive HER2 negative, Ki67 10%, pT2N0M0, stage I -Diagnosed 05/10/2020 with invasive lobular carcinoma s/p lumpectomy on 06/29/20 by Dr. Marlou Starks. Pathology showed 2.1 cm invasive and in situ lobular carcinoma, margins and lymph nodes negative. Oncotype recurrence score of 15, low risk.  Adjuvant chemotherapy is not recommended. -She completed adjuvant radiation therapy 09/14/2020 by Dr. Lisbeth Renshaw.   -Given the strong ER and PR expression in her postmenopausal status, adjuvant  endocrine therapy with AI was recommended for a total of 5-10 years to reduce the risk of cancer recurrence. she did not tolerate exemestane or letrozole due to arthritis and severe hot flashes, she stopped in 12/2020.  -She was able to come off Wellbutrin and start tamoxifen in 04/2021.  Netta Neat is clinically doing well.  Tolerating tamoxifen with mild hot flash and knee aches, much better than AI.  Breast exam is unremarkable, recent labs from PCP have been reviewed. -Overall no clinical concern for breast cancer recurrence.  Continue surveillance and tamoxifen -Mammogram due late March 2024, order placed today -Follow-up in 6 months, or sooner if needed   2. Abnormal vaginal bleeding -onset 05/2020, mostly post-coital from ob/gyn notes -endometrium curettage performed during lumpectomy showed benign pathology. -Denies abnormal bleeding.  Continue follow-up with Dr. Sabra Heck   3. Genetics -patient has personal history of sq.cell skin and now breast cancer. Her father had multiple cancers (prostate, kidney, skin, and palate), and she has family history of pancreas cancer and breast cancer in 33 year old cousin.  -testing 05/30/20 was negative -She is concerned about getting other types of cancer, I encouraged her to continue age-appropriate screening, abstain from smoking, limit alcohol, and live a healthy active lifestyle   4. HTN, HL, DM, arthritis, low vitamin D -per PCP Dr. Justin Mend -arthritis is mainly in hands, has normal function -He developed severe knee pain on AI, much improved on tamoxifen -Encouraged her to get back to aqua aerobics -Vitamin D low, 24 in 12/2021 at PCP.  She takes 2000 IUs daily   5. Health maintenance -last pap 02/10/18 negative, endometrium curettage 06/29/20 benign (GYN is Dr. Sabra Heck) -Colonoscopy 11/2020 -she was under care of Dr. Jearld Lesch for medical weight management, she has not been lately and has gained weight.  She wants to try to lose more on her  own before  going back to weight loss clinic   Plan: -Recent labs reviewed -Continue breast cancer surveillance and tamoxifen -Mammogram due late March/early April, order placed today -Continue healthy active lifestyle and intentional weight loss -Follow-up Dr. Sabra Heck 04/05/2022 as scheduled -Next breast cancer surveillance visit in 6 months, or sooner if needed   Orders Placed This Encounter  Procedures   MM DIAG BREAST TOMO BILATERAL    Standing Status:   Future    Standing Expiration Date:   01/16/2023    Order Specific Question:   Reason for Exam (SYMPTOM  OR DIAGNOSIS REQUIRED)    Answer:   h/o left breast cancer 05/2020, s/p lumpectomy and radiation    Order Specific Question:   Preferred imaging location?    Answer:   [facility]   All questions were answered. The patient knows to call the clinic with any problems, questions or concerns. No barriers to learning was detected. I spent 20 minutes counseling the patient face to face. The total time spent in the appointment was 30 minutes and more than 50% was on counseling, review of test results, and coordination of care.     Alla Feeling, NP 01/15/22

## 2022-01-15 ENCOUNTER — Encounter: Payer: Self-pay | Admitting: Hematology

## 2022-01-15 ENCOUNTER — Inpatient Hospital Stay: Payer: BC Managed Care – PPO | Attending: Nurse Practitioner | Admitting: Nurse Practitioner

## 2022-01-15 ENCOUNTER — Encounter: Payer: Self-pay | Admitting: Nurse Practitioner

## 2022-01-15 ENCOUNTER — Other Ambulatory Visit: Payer: Self-pay

## 2022-01-15 ENCOUNTER — Other Ambulatory Visit: Payer: BC Managed Care – PPO

## 2022-01-15 VITALS — BP 140/80 | HR 57 | Temp 98.6°F | Resp 19 | Ht 69.0 in | Wt 340.4 lb

## 2022-01-15 DIAGNOSIS — Z79810 Long term (current) use of selective estrogen receptor modulators (SERMs): Secondary | ICD-10-CM | POA: Insufficient documentation

## 2022-01-15 DIAGNOSIS — C50512 Malignant neoplasm of lower-outer quadrant of left female breast: Secondary | ICD-10-CM | POA: Diagnosis present

## 2022-01-15 DIAGNOSIS — I10 Essential (primary) hypertension: Secondary | ICD-10-CM | POA: Insufficient documentation

## 2022-01-15 DIAGNOSIS — Z170 Estrogen receptor positive status [ER+]: Secondary | ICD-10-CM

## 2022-01-16 ENCOUNTER — Telehealth: Payer: Self-pay | Admitting: Nurse Practitioner

## 2022-01-16 NOTE — Telephone Encounter (Signed)
Left patient a voicemail regarding upcoming appointments  

## 2022-01-21 ENCOUNTER — Encounter: Payer: Self-pay | Admitting: Nurse Practitioner

## 2022-02-09 ENCOUNTER — Other Ambulatory Visit: Payer: Self-pay | Admitting: Hematology

## 2022-02-09 DIAGNOSIS — C50512 Malignant neoplasm of lower-outer quadrant of left female breast: Secondary | ICD-10-CM

## 2022-03-24 ENCOUNTER — Other Ambulatory Visit (HOSPITAL_BASED_OUTPATIENT_CLINIC_OR_DEPARTMENT_OTHER): Payer: Self-pay | Admitting: Obstetrics & Gynecology

## 2022-03-24 DIAGNOSIS — L900 Lichen sclerosus et atrophicus: Secondary | ICD-10-CM

## 2022-04-05 ENCOUNTER — Ambulatory Visit (INDEPENDENT_AMBULATORY_CARE_PROVIDER_SITE_OTHER): Payer: BC Managed Care – PPO | Admitting: Obstetrics & Gynecology

## 2022-04-05 ENCOUNTER — Encounter (HOSPITAL_BASED_OUTPATIENT_CLINIC_OR_DEPARTMENT_OTHER): Payer: Self-pay | Admitting: Obstetrics & Gynecology

## 2022-04-05 VITALS — BP 145/78 | HR 70 | Ht 68.5 in | Wt 342.4 lb

## 2022-04-05 DIAGNOSIS — Z6841 Body Mass Index (BMI) 40.0 and over, adult: Secondary | ICD-10-CM | POA: Diagnosis not present

## 2022-04-05 DIAGNOSIS — L900 Lichen sclerosus et atrophicus: Secondary | ICD-10-CM | POA: Diagnosis not present

## 2022-04-05 DIAGNOSIS — C50512 Malignant neoplasm of lower-outer quadrant of left female breast: Secondary | ICD-10-CM | POA: Diagnosis not present

## 2022-04-05 DIAGNOSIS — Z01419 Encounter for gynecological examination (general) (routine) without abnormal findings: Secondary | ICD-10-CM | POA: Diagnosis not present

## 2022-04-05 DIAGNOSIS — B372 Candidiasis of skin and nail: Secondary | ICD-10-CM

## 2022-04-05 DIAGNOSIS — Z170 Estrogen receptor positive status [ER+]: Secondary | ICD-10-CM

## 2022-04-05 DIAGNOSIS — I10 Essential (primary) hypertension: Secondary | ICD-10-CM

## 2022-04-05 MED ORDER — NYSTATIN 100000 UNIT/GM EX CREA: 1.0000 | TOPICAL_CREAM | Freq: Two times a day (BID) | CUTANEOUS | 0 refills | Status: DC

## 2022-04-05 MED ORDER — MOMETASONE FUROATE 0.1 % EX OINT: TOPICAL_OINTMENT | CUTANEOUS | 3 refills | Status: DC

## 2022-04-05 MED ORDER — NYSTATIN 100000 UNIT/GM EX CREA: 1.0000 | TOPICAL_CREAM | Freq: Two times a day (BID) | CUTANEOUS | 2 refills | Status: AC

## 2022-04-05 MED ORDER — FLUCONAZOLE IN SODIUM CHLORIDE 200-0.9 MG/100ML-% IV SOLN: INTRAVENOUS | 0 refills | Status: DC

## 2022-04-05 NOTE — Progress Notes (Signed)
62 y.o. G1P1 Significant Other White or Caucasian female here for annual exam.  Denies vaginal bleeding.  Has a skin rash she would like me to look at today.  It under skin folds on abdomen.    Is still being followed by oncologist.  She is on tamoxifen.  The goal is to take this for 10 years.    Did have lab work with Windy Carina within the last six months.  Reports hba1c is 6.9.  declined treatment.  Working on dietary changes.  Lipids were ok.    Patient's last menstrual period was 07/21/2015.          Sexually active: Yes.    The current method of family planning is status post hysterectomy.    Smoker:  no  Health Maintenance: Pap:  03/30/2021 Negative History of abnormal Pap:  no MMG:  05/04/2021 Colonoscopy:  11/07/2020, follow up 10 years BMD:  01/2021.  Normal. Screening Labs: does with Dr. Justin Mend   reports that she has never smoked. She has never used smokeless tobacco. She reports that she does not drink alcohol and does not use drugs.  Past Medical History:  Diagnosis Date   Abnormal Pap smear 08/13/2012   LSIL may include HPV or mild Dysplasia (CIN1)   Acute lateral meniscus tear of right knee, subsequent encounter    Breast cancer ([place]) 05/2020   LEFT   Depression    Family history of breast cancer 05/30/2020   Family history of pancreatic cancer 05/30/2020   Family history of prostate cancer 05/30/2020   Family history of renal cancer 05/30/2020   Family history of skin cancer 05/30/2020   Gallbladder problem    Heart murmur    cardiologist a long time ago ECHO not completed   Hypertension    on meds   Knee pain    Lateral meniscus tear    right knee   Obesity    Other fatigue    Personal history of radiation therapy    Pre-diabetes    Shortness of breath on exertion    Sleep apnea    uses CPAP nightly   Vitamin D deficiency     Past Surgical History:  Procedure Laterality Date   BREAST LUMPECTOMY     BREAST LUMPECTOMY WITH RADIOACTIVE SEED AND  SENTINEL LYMPH NODE BIOPSY Left 06/29/2020   Procedure: LEFT BREAST LUMPECTOMY WITH RADIOACTIVE SEED;  Surgeon: Jovita Kussmaul, MD;  Location: [place];  Service: General;  Laterality: Left;   CYST REMOVAL TRUNK Left 06/29/2020   Procedure: REMOVAL OF SEBACEOUS CYST;  Surgeon: Megan Salon, MD;  Location: [place];  Service: Gynecology;  Laterality: Left;   HYSTEROSCOPY WITH D & C N/A 06/29/2020   Procedure: DILATATION AND CURETTAGE /HYSTEROSCOPY WITH MYOSURE;  Surgeon: Megan Salon, MD;  Location: [place];  Service: Gynecology;  Laterality: N/A;   KNEE ARTHROSCOPY WITH LATERAL MENISECTOMY Right 10/03/2020   Procedure: KNEE ARTHROSCOPY WITH LATERAL MENISECTOMY;  Surgeon: Elsie Saas, MD;  Location: [place];  Service: Orthopedics;  Laterality: Right;   LAPAROSCOPIC CHOLECYSTECTOMY  2001   SENTINEL NODE BIOPSY Left 06/29/2020   Procedure: LEFT SENTINEL LYMPH NODE BIOPSY;  Surgeon: Autumn Messing III, MD;  Location: [place];  Service: General;  Laterality: Left;    Current Outpatient Medications  Medication Sig Dispense Refill   mometasone (ELOCON) 0.1 % ointment APPLY TOPICALLY TO THE AFFECTED AREA AS DIRECTED ONCE OR TWICE WEEKLY 45 g 0   tamoxifen (NOLVADEX) 20 MG  tablet TAKE 1 TABLET(20 MG) BY MOUTH DAILY 90 tablet 1   valsartan-hydrochlorothiazide (DIOVAN-HCT) 320-25 MG tablet Take 1 tablet by mouth daily.     VITAMIN D PO Take 1 tablet by mouth daily at 6 (six) AM.     No current facility-administered medications for this visit.    Family History  Problem Relation Age of Onset   Hypertension Mother    Hyperlipidemia Mother    Heart disease Mother    Obesity Mother    Skin cancer Mother    Hypertension Father    Obesity Father    Prostate cancer Father        dx 40s   Renal cancer Father        dx 72s   Cancer Father        mouth; dx after 26   Skin cancer Father        dx after 67   Melanoma Maternal Uncle        mets; dx 6s   Pancreatic cancer Paternal Aunt         dx 54s   Cancer Paternal Uncle        unknown type; dx unknown age   Skin cancer Maternal Grandmother        face   Cancer Paternal Grandmother        unknown type; ? pancreatic; dx after 31   Breast cancer Cousin        dx 71s; paternal cousin   Colon cancer Neg Hx    Colon polyps Neg Hx    Esophageal cancer Neg Hx    Rectal cancer Neg Hx    Stomach cancer Neg Hx     ROS: Constitutional: negative Genitourinary:negative  Exam:   BP (!) 145/78 (BP Location: Right Arm, Patient Position: Sitting, Cuff Size: Large)   Pulse 70   Ht 5' 8.5" (1.74 m) Comment: Reported  Wt (!) 342 lb 6.4 oz (155.3 kg)   LMP 07/21/2015   BMI 51.30 kg/m   Height: 5' 8.5" (174 cm) (Reported)  General appearance: alert, cooperative and appears stated age Head: Normocephalic, without obvious abnormality, atraumatic Neck: no adenopathy, supple, symmetrical, trachea midline and thyroid normal to inspection and palpation Lungs: clear to auscultation bilaterally Breasts: normal appearance, no masses or tenderness Heart: regular rate and rhythm Abdomen: soft, non-tender; bowel sounds normal; no masses,  no organomegaly Extremities: extremities normal, atraumatic, no cyanosis or edema Skin: Skin color, texture, turgor normal. Macular erythematous rash under abdominal skin folds with satellite lesions Lymph nodes: Cervical, supraclavicular, and axillary nodes normal. No abnormal inguinal nodes palpated Neurologic: Grossly normal   Pelvic: External genitalia:  no lesions, no hypopigmentation present              Urethra:  normal appearing urethra with no masses, tenderness or lesions              Bartholins and Skenes: normal                 Vagina: erythema with whitish and clumpy discharge              Cervix: no lesions              Pap taken: No. Bimanual Exam:  Uterus:  normal size, contour, position, consistency, mobility, non-tender              Adnexa: normal adnexa and no mass, fullness,  tenderness  Rectovaginal: Confirms               Anus:  normal sphincter tone, no lesions  Chaperone, Octaviano Batty, CMA, was present for exam.  Assessment/Plan: 1. Well woman exam with routine gynecological exam - Pap smear neg with neg HR HPV 2023.   - Mammogram scheduled 05/07/2022 - Colonoscopy 2022 - Bone mineral density 01/2021 - lab work done with PCP - vaccines reviewed/updated  2. Candidal skin infection - fluconazole (DIFLUCAN) 200-0.9 MG/100ML-% IVPB; 1 tab every 3 days for 4 doses  Dispense: 4100 mL; Refill: 0 - nystatin cream (MYCOSTATIN); Apply 1 Application topically 2 (two) times daily. Apply to affected area BID for up to 7 days.  Dispense: 30 g; Refill: 0  3. Class 3 severe obesity with serious comorbidity and body mass index (BMI) of 50.0 to 59.9 in adult, unspecified obesity type ([place])  4. Lichen sclerosus et atrophicus - mometasone (ELOCON) 0.1 % ointment; APPLY TOPICALLY TO THE AFFECTED AREA AS DIRECTED ONCE OR TWICE WEEKLY  Dispense: 45 g; Refill: 3  5. Essential hypertension, benign  6. Malignant neoplasm of lower-outer quadrant of left breast of female, estrogen receptor positive ([place])

## 2022-05-07 ENCOUNTER — Ambulatory Visit
Admission: RE | Admit: 2022-05-07 | Discharge: 2022-05-07 | Disposition: A | Discharge status: Home / Self Care | Payer: BC Managed Care – PPO | Source: Ambulatory Visit | Attending: Nurse Practitioner | Admitting: Nurse Practitioner

## 2022-05-07 DIAGNOSIS — C50512 Malignant neoplasm of lower-outer quadrant of left female breast: Secondary | ICD-10-CM

## 2022-07-18 ENCOUNTER — Other Ambulatory Visit: Payer: Self-pay

## 2022-07-18 ENCOUNTER — Encounter: Payer: Self-pay | Admitting: Nurse Practitioner

## 2022-07-18 ENCOUNTER — Inpatient Hospital Stay: Payer: BC Managed Care – PPO | Attending: Nurse Practitioner

## 2022-07-18 ENCOUNTER — Inpatient Hospital Stay: Payer: BC Managed Care – PPO | Admitting: Nurse Practitioner

## 2022-07-18 DIAGNOSIS — C50512 Malignant neoplasm of lower-outer quadrant of left female breast: Secondary | ICD-10-CM

## 2022-07-18 DIAGNOSIS — Z79810 Long term (current) use of selective estrogen receptor modulators (SERMs): Secondary | ICD-10-CM | POA: Insufficient documentation

## 2022-07-18 DIAGNOSIS — Z170 Estrogen receptor positive status [ER+]: Secondary | ICD-10-CM | POA: Diagnosis not present

## 2022-07-18 LAB — CBC WITH DIFFERENTIAL/PLATELET
Abs Immature Granulocytes: 0.01 10*3/uL (ref 0.00–0.07)
Basophils Absolute: 0 10*3/uL (ref 0.0–0.1)
Basophils Relative: 1 %
Eosinophils Absolute: 0.1 10*3/uL (ref 0.0–0.5)
Eosinophils Relative: 2 %
HCT: 41.6 % (ref 36.0–46.0)
Hemoglobin: 14.8 g/dL (ref 12.0–15.0)
Immature Granulocytes: 0 %
Lymphocytes Relative: 30 %
Lymphs Abs: 1.9 10*3/uL (ref 0.7–4.0)
MCH: 30 pg (ref 26.0–34.0)
MCHC: 35.6 g/dL (ref 30.0–36.0)
MCV: 84.4 fL (ref 80.0–100.0)
Monocytes Absolute: 0.6 10*3/uL (ref 0.1–1.0)
Monocytes Relative: 9 %
Neutro Abs: 3.8 10*3/uL (ref 1.7–7.7)
Neutrophils Relative %: 58 %
Platelets: 255 10*3/uL (ref 150–400)
RBC: 4.93 MIL/uL (ref 3.87–5.11)
RDW: 12.6 % (ref 11.5–15.5)
WBC: 6.5 10*3/uL (ref 4.0–10.5)
nRBC: 0 % (ref 0.0–0.2)

## 2022-07-18 LAB — COMPREHENSIVE METABOLIC PANEL
ALT: 29 U/L (ref 0–44)
AST: 29 U/L (ref 15–41)
Albumin: 3.8 g/dL (ref 3.5–5.0)
Alkaline Phosphatase: 82 U/L (ref 38–126)
Anion gap: 9 (ref 5–15)
BUN: 14 mg/dL (ref 8–23)
CO2: 29 mmol/L (ref 22–32)
Calcium: 9.5 mg/dL (ref 8.9–10.3)
Chloride: 102 mmol/L (ref 98–111)
Creatinine, Ser: 0.65 mg/dL (ref 0.44–1.00)
GFR, Estimated: >60 mL/min (ref >60)
Glucose, Bld: 148 mg/dL — ABNORMAL HIGH (ref 70–99)
Potassium: 3.4 mmol/L — ABNORMAL LOW (ref 3.5–5.1)
Sodium: 140 mmol/L (ref 135–145)
Total Bilirubin: 0.6 mg/dL (ref 0.3–1.2)
Total Protein: 7 g/dL (ref 6.5–8.1)

## 2022-07-18 MED ORDER — TAMOXIFEN CITRATE 20 MG PO TABS: ORAL_TABLET | ORAL | 3 refills | Status: DC

## 2022-07-18 NOTE — Progress Notes (Signed)
Patient Care Team: Camie Patience, FNP as PCP - General (Family Medicine) Donnelly Angelica, RN as Oncology Nurse Navigator Pershing Proud, RN as Oncology Nurse Navigator Malachy Mood, MD as Consulting Physician (Hematology) Griselda Miner, MD as Consulting Physician (General Surgery) Dorothy Puffer, MD as Consulting Physician (Radiation Oncology) Pollyann Samples, NP as Nurse Practitioner (Nurse Practitioner)   CHIEF COMPLAINT: Follow-up lobular breast cancer  Oncology History Overview Note  Cancer Staging Malignant neoplasm of lower-outer quadrant of left breast of female, estrogen receptor positive (HCC) Staging form: Breast, AJCC 8th Edition - Clinical stage from 05/10/2020: Stage IA (cT1c, cN0, cM0, G2, ER+, PR+, HER2-) - Signed by Loa Socks, NP on 05/18/2020 Histologic grading system: 3 grade system - Pathologic stage from 08/03/2020: Stage IA (pT2, pN0(sn), cM0, G2, ER+, PR+, HER2-, Oncotype DX score: 15) - Signed by Malachy Mood, MD on 09/14/2020 Method of lymph node assessment: Sentinel lymph node biopsy Multigene prognostic tests performed: Oncotype DX Recurrence score range: Greater than or equal to 11 Histologic grading system: 3 grade system    Malignant neoplasm of lower-outer quadrant of left breast of female, estrogen receptor positive (HCC)  05/09/2020 Breast US   IMPRESSION: 1. Suspicious 1.9 cm mass with associated architectural distortion involving the LOWER OUTER QUADRANT of the LEFT breast at the 5 o'clock position approximately 3 cm from nipple. 2. No pathologic LEFT axillary lymphadenopathy.   05/10/2020 Cancer Staging   Staging form: Breast, AJCC 8th Edition - Clinical stage from 05/10/2020: Stage IA (cT1c, cN0, cM0, G2, ER+, PR+, HER2-) - Signed by Loa Socks, NP on 05/18/2020 Histologic grading system: 3 grade system   05/10/2020 Initial Biopsy   Diagnosis Breast, left, needle core biopsy, 5 o'clock - INVASIVE MAMMARY CARCINOMA - MAMMARY  CARCINOMA IN-SITU - SEE COMMENT Microscopic Comment The biopsy material shows an infiltrative proliferation of cells arranged linearly and in small clusters. Based on the biopsy, the carcinoma appears Nottingham grade 2 of 3 and measures 1.5 cm in greatest linear extent. The tumor cells are NEGATIVE for Her2 (1+).  Estrogen Receptor: 80%, POSITIVE, STRONG-MODERATE STAINING INTENSITY Progesterone Receptor: 90%, POSITIVE, STRONG STAINING INTENSITY Proliferation Marker Ki67: 10% E-cadherin is NEGATIVE supporting lobular origin.   05/18/2020 Initial Diagnosis   Malignant neoplasm of lower-outer quadrant of left breast of female, estrogen receptor positive (HCC)   05/31/2020 Imaging   Breast MRI IMPRESSION: 1. 1.5 cm heterogeneously enhancing, biopsy-proven, carcinoma in the lower slightly outer aspect of the left breast, at 5 o'clock. 2. No evidence of additional left breast carcinoma. 3. No evidence of right breast carcinoma. 4. No abnormal lymph nodes   06/06/2020 Genetic Testing   No pathogenic variants detected in [facility] CancerNext-Expanded +RNAinsight Panel.  The report date is Jun 13, 2020.  The CancerNext-Expanded gene panel offered by [facility] and includes sequencing, rearrangement, and RNA analysis for the following 77 genes: AIP, ALK, APC, ATM, AXIN2, BAP1, BARD1, BLM, BMPR1A, BRCA1, BRCA2, BRIP1, CDC73, CDH1, CDK4, CDKN1B, CDKN2A, CHEK2, CTNNA1, DICER1, FANCC, FH, FLCN, GALNT12, KIF1B, LZTR1, MAX, MEN1, MET, MLH1, MSH2, MSH3, MSH6, MUTYH, NBN, NF1, NF2, NTHL1, PALB2, PHOX2B, PMS2, POT1, PRKAR1A, PTCH1, PTEN, RAD51C, RAD51D, RB1, RECQL, RET, SDHA, SDHAF2, SDHB, SDHC, SDHD, SMAD4, SMARCA4, SMARCB1, SMARCE1, STK11, SUFU, TMEM127, TP53, TSC1, TSC2, VHL and XRCC2 (sequencing and deletion/duplication); EGFR, EGLN1, HOXB13, KIT, MITF, PDGFRA, POLD1, and POLE (sequencing only); EPCAM and GREM1 (deletion/duplication only).    06/29/2020 Pathology Results   FINAL MICROSCOPIC DIAGNOSIS:   A.  BREAST,  LEFT, LUMPECTOMY:  - Invasive lobular carcinoma, grade 2, spanning 2.1 cm.  - Lobular carcinoma in situ.  - Biopsy site.  - Margins are negative for carcinoma.  - See oncology table.   B. LYMPH NODE, LEFT #1, SENTINEL, EXCISION:  - One of one lymph nodes negative for carcinoma (0/1).   C. LYMPH NODE, LEFT, SENTINEL, EXCISION:  - One of one lymph nodes negative for carcinoma (0/1).   D. LYMPH NODE, LEFT, SENTINEL, EXCISION:  - One of one lymph nodes negative for carcinoma (0/1).   E. LYMPH NODE, LEFT #2, SENTINEL, EXCISION:  - One of one lymph nodes negative for carcinoma (0/1).   06/29/2020 Oncotype testing   Recurrence score of 15 predicts a risk of recurrence outside the breast over the next 9 years of 4%, if the patient's only systemic therapy is an antiestrogen for 5 years.    08/03/2020 Cancer Staging   Staging form: Breast, AJCC 8th Edition - Pathologic stage from 08/03/2020: Stage IA (pT2, pN0(sn), cM0, G2, ER+, PR+, HER2-, Oncotype DX score: 15) - Signed by Malachy Mood, MD on 09/14/2020 Method of lymph node assessment: Sentinel lymph node biopsy Multigene prognostic tests performed: Oncotype DX Recurrence score range: Greater than or equal to 11 Histologic grading system: 3 grade system   08/18/2020 - 09/06/2020 Radiation Therapy   Completed adjuvant radiation per Dr. Mitzi Hansen   11/2020 - 12/2020 Anti-estrogen oral therapy   Did not tolerate exemestane or letrozole due to severe hot flashes and knee pain   01/23/2021 Survivorship   SCP delivered by Santiago Glad, NP      CURRENT THERAPY:  Tamoxifen, started 04/2021  INTERVAL HISTORY Anna Martinez returns for follow-up as scheduled, last seen by me 01/15/22.  Mammogram 05/07/2022 was benign.  She continues tamoxifen, tolerating well, hot flashes are occasional and tolerable.  Denies concerns in her breast such as new lump/mass, nipple discharge or inversion, or skin change.  Mood is stable off Wellbutrin.  Since last visit  she was diagnosed with diabetes, medication was recommended but she has tried to lose weight through intermittent fasting, low sugar and cutting out processed carbs.  She has lost 10 pounds in the past month and prefers to keep trying this until her next A1c check in September.  ROS  All other systems reviewed and negative  Past Medical History:  Diagnosis Date   Abnormal Pap smear 08/13/2012   LSIL may include HPV or mild Dysplasia (CIN1)   Acute lateral meniscus tear of right knee, subsequent encounter    Breast cancer (HCC) 05/2020   LEFT   Depression    Family history of breast cancer 05/30/2020   Family history of pancreatic cancer 05/30/2020   Family history of prostate cancer 05/30/2020   Family history of renal cancer 05/30/2020   Family history of skin cancer 05/30/2020   Gallbladder problem    Heart murmur    cardiologist a long time ago ECHO not completed   Hypertension    on meds   Knee pain    Lateral meniscus tear    right knee   Obesity    Other fatigue    Personal history of radiation therapy    Pre-diabetes    Shortness of breath on exertion    Sleep apnea    uses CPAP nightly   Vitamin D deficiency      Past Surgical History:  Procedure Laterality Date   BREAST LUMPECTOMY Left 06/29/2020   BREAST LUMPECTOMY WITH RADIOACTIVE SEED AND  SENTINEL LYMPH NODE BIOPSY Left 06/29/2020   Procedure: LEFT BREAST LUMPECTOMY WITH RADIOACTIVE SEED;  Surgeon: Griselda Miner, MD;  Location: [facility] OR;  Service: General;  Laterality: Left;   CYST REMOVAL TRUNK Left 06/29/2020   Procedure: REMOVAL OF SEBACEOUS CYST;  Surgeon: Jerene Bears, MD;  Location: [facility] OR;  Service: Gynecology;  Laterality: Left;   HYSTEROSCOPY WITH D & C N/A 06/29/2020   Procedure: DILATATION AND CURETTAGE /HYSTEROSCOPY WITH MYOSURE;  Surgeon: Jerene Bears, MD;  Location: [facility] OR;  Service: Gynecology;  Laterality: N/A;   KNEE ARTHROSCOPY WITH LATERAL MENISECTOMY Right 10/03/2020   Procedure: KNEE  ARTHROSCOPY WITH LATERAL MENISECTOMY;  Surgeon: Salvatore Marvel, MD;  Location: [facility];  Service: Orthopedics;  Laterality: Right;   LAPAROSCOPIC CHOLECYSTECTOMY  2001   SENTINEL NODE BIOPSY Left 06/29/2020   Procedure: LEFT SENTINEL LYMPH NODE BIOPSY;  Surgeon: Griselda Miner, MD;  Location: [facility] OR;  Service: General;  Laterality: Left;     Outpatient Encounter Medications as of 07/18/2022  Medication Sig   fluconazole (DIFLUCAN) 200-0.9 MG/100ML-% IVPB 1 tab every 3 days for 4 doses   mometasone (ELOCON) 0.1 % ointment APPLY TOPICALLY TO THE AFFECTED AREA AS DIRECTED ONCE OR TWICE WEEKLY   nystatin cream (MYCOSTATIN) Apply 1 Application topically 2 (two) times daily. Apply to affected area BID for up to 7 days.   tamoxifen (NOLVADEX) 20 MG tablet TAKE 1 TABLET(20 MG) BY MOUTH DAILY   valsartan-hydrochlorothiazide (DIOVAN-HCT) 320-25 MG tablet Take 1 tablet by mouth daily.   VITAMIN D PO Take 1 tablet by mouth daily at 6 (six) AM.   [DISCONTINUED] tamoxifen (NOLVADEX) 20 MG tablet TAKE 1 TABLET(20 MG) BY MOUTH DAILY   No facility-administered encounter medications on file as of 07/18/2022.     Today's Vitals   07/18/22 1048  BP: (!) 156/91  Pulse: 64  Resp: 19  Temp: 98.2 F (36.8 C)  TempSrc: Oral  SpO2: 97%  Weight: (!) 330 lb 9 oz (149.9 kg)  Height: 5' 8.5" (1.74 m)   Body mass index is 49.53 kg/m.   PHYSICAL EXAM GENERAL:alert, no distress and comfortable SKIN: no rash  EYES: sclera clear NECK: without mass LYMPH:  no palpable cervical or supraclavicular lymphadenopathy  LUNGS: normal breathing effort HEART:  no lower extremity edema ABDOMEN: abdomen soft, non-tender and normal bowel sounds NEURO: alert & oriented x 3 with fluent speech, no focal motor/sensory deficits Breast exam: No nipple discharge or inversion.  S/p left lumpectomy, incisions completely healed.  No palpable mass or nodularity in either breast or axilla that I could appreciate.      CBC    Component Value Date/Time   WBC 6.5 07/18/2022 1029   RBC 4.93 07/18/2022 1029   HGB 14.8 07/18/2022 1029   HGB 13.2 04/11/2021 0952   HGB 14.5 04/07/2020 0946   HCT 41.6 07/18/2022 1029   HCT 44.6 04/07/2020 0946   PLT 255 07/18/2022 1029   PLT 256 04/11/2021 0952   PLT 316 04/07/2020 0946   MCV 84.4 07/18/2022 1029   MCV 86 04/07/2020 0946   MCH 30.0 07/18/2022 1029   MCHC 35.6 07/18/2022 1029   RDW 12.6 07/18/2022 1029   RDW 13.3 04/07/2020 0946   LYMPHSABS 1.9 07/18/2022 1029   LYMPHSABS 2.2 04/07/2020 0946   MONOABS 0.6 07/18/2022 1029   EOSABS 0.1 07/18/2022 1029   EOSABS 0.2 04/07/2020 0946   BASOSABS 0.0 07/18/2022 1029   BASOSABS 0.0 04/07/2020 0946  CMP     Component Value Date/Time   NA 140 07/18/2022 1029   NA 140 01/19/2021 0834   K 3.4 (L) 07/18/2022 1029   CL 102 07/18/2022 1029   CO2 29 07/18/2022 1029   GLUCOSE 148 (H) 07/18/2022 1029   BUN 14 07/18/2022 1029   BUN 16 01/19/2021 0834   CREATININE 0.65 07/18/2022 1029   CREATININE 0.72 04/11/2021 0952   CALCIUM 9.5 07/18/2022 1029   PROT 7.0 07/18/2022 1029   PROT 6.8 01/19/2021 0834   ALBUMIN 3.8 07/18/2022 1029   ALBUMIN 4.5 01/19/2021 0834   AST 29 07/18/2022 1029   AST 13 (L) 04/11/2021 0952   ALT 29 07/18/2022 1029   ALT 15 04/11/2021 0952   ALKPHOS 82 07/18/2022 1029   BILITOT 0.6 07/18/2022 1029   BILITOT 0.5 04/11/2021 0952   GFRNONAA >60 07/18/2022 1029   GFRNONAA >60 04/11/2021 6440     ASSESSMENT & PLAN: TREVOR DUTY is a 62 y.o. female with    1. Malignant neoplasm in left lower outer breast - invasive lobular carcinoma, grade II, ER/PR positive HER2 negative, Ki67 10%, pT2N0M0, stage I -Diagnosed 05/10/2020 with invasive lobular carcinoma s/p lumpectomy on 06/29/20 by Dr. Carolynne Edouard. Pathology showed 2.1 cm invasive and in situ lobular carcinoma, margins and lymph nodes negative. Oncotype recurrence score of 15, low risk.  Adjuvant chemotherapy is not recommended. -She  completed adjuvant radiation therapy 09/14/2020 by Dr. Mitzi Hansen.   -Given the strong ER and PR expression in her postmenopausal status, adjuvant endocrine therapy with AI was recommended for a total of 7-10 years (lobular histology) to reduce the risk of cancer recurrence. she did not tolerate exemestane or letrozole due to arthritis and severe hot flashes, she stopped in 12/2020.  -She was able to come off Wellbutrin and start tamoxifen in 04/2021.  -Ms. Hartmann is clinically doing well.  Tolerating tamoxifen.  Breast exam is benign, labs are unremarkable.  Recent mammogram 05/2022 was benign.  Overall no clinical concern for breast cancer recurrence -Continue breast cancer surveillance and tamoxifen, refilled -Follow-up with me in 6 months, or sooner if needed   2. Abnormal vaginal bleeding -onset 05/2020, mostly post-coital from ob/gyn notes -endometrium curettage performed during lumpectomy showed benign pathology. -Denies recurrent/abnormal bleeding.   -Continue follow-up with Dr. Hyacinth Meeker   3. Genetics -patient has personal history of sq.cell skin and now breast cancer. Her father had multiple cancers (prostate, kidney, skin, and palate), and she has family history of pancreas cancer and breast cancer in 52 year old cousin.  -testing 05/30/20 was negative -She is concerned about getting other types of cancer, I encouraged her to continue age-appropriate screening, abstain from smoking, limit alcohol, and live a healthy active lifestyle   4. HTN, HL, DM, arthritis, low vitamin D, mood -per PCP Dr. 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  She takes 2000 IUs daily -Recent labs showed DM per patient, she is intermittent fasting for weight loss to try to manage this without medication for now -Weaned off Wellbutrin to start tamoxifen, mood  is stable off medication.  She will let me know if/when she would like to restart something   5. Health maintenance -last pap 02/10/18 negative, endometrium curettage 06/29/20 benign (GYN is Dr. Hyacinth Meeker) -Colonoscopy 11/2020     PLAN: -Recent mammogram and today's labs  reviewed -Continue breast cancer surveillance and tamoxifen, refilled -Continue healthy active lifestyle and age-appropriate health maintenance -Lab and follow-up with me in 6 months, or sooner if needed    All questions were answered. The patient knows to call the clinic with any problems, questions or concerns. No barriers to learning were detected.   Santiago Glad, NP-C 07/18/2022

## 2022-07-19 LAB — CANCER ANTIGEN 27.29: CA 27.29: 22.3 U/mL (ref 0.0–38.6)

## 2022-10-27 IMAGING — US US BREAST*L* LIMITED INC AXILLA
1 series · 13 of 20 positions shown · non-contrast
Comparison: Previous exam(s).

CLINICAL DATA: Recall from 2D screening mammography, possible mass
associated with architectural distortion involving the LOWER LEFT
breast at middle depth.

EXAM:
DIGITAL DIAGNOSTIC UNILATERAL LEFT MAMMOGRAM WITH TOMOSYNTHESIS AND
CAD; ULTRASOUND LEFT BREAST LIMITED
TECHNIQUE: Left digital diagnostic mammography and breast tomosynthesis was
performed. The images were evaluated with computer-aided detection.;
Targeted ultrasound examination of the left breast was performed

[Series 1: us breast*left* limited inc axilla · 0.06mm/px · 13 of 20 slices shown]
[im 1/20]
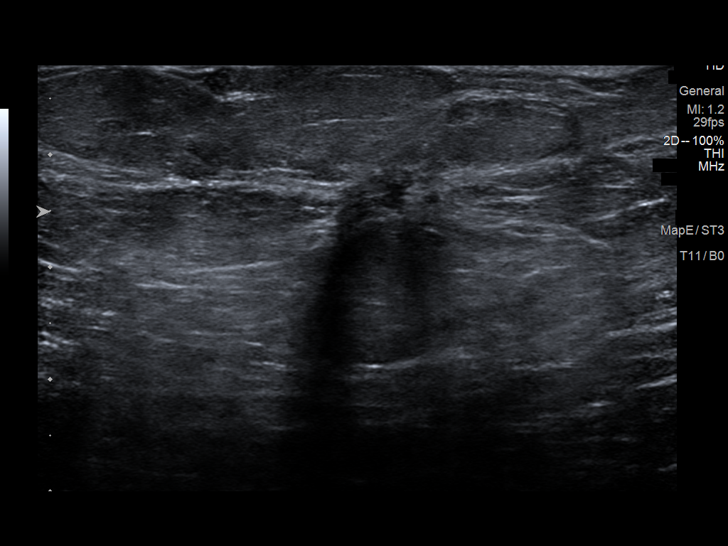
[im 3/20]
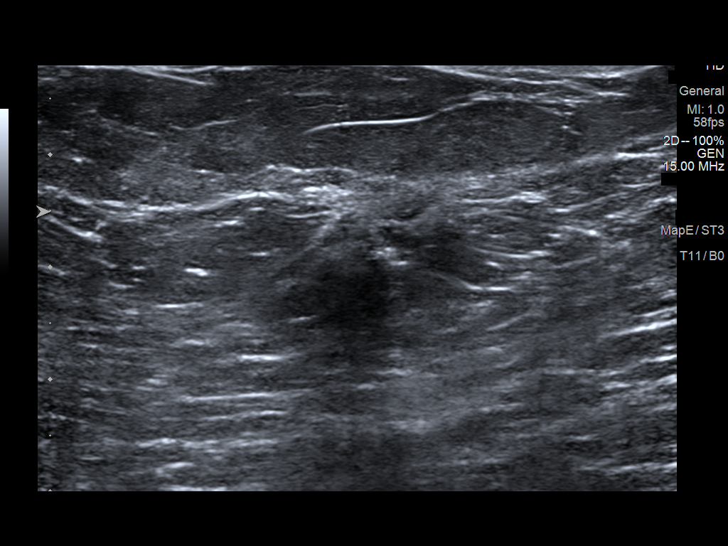
[im 4/20]
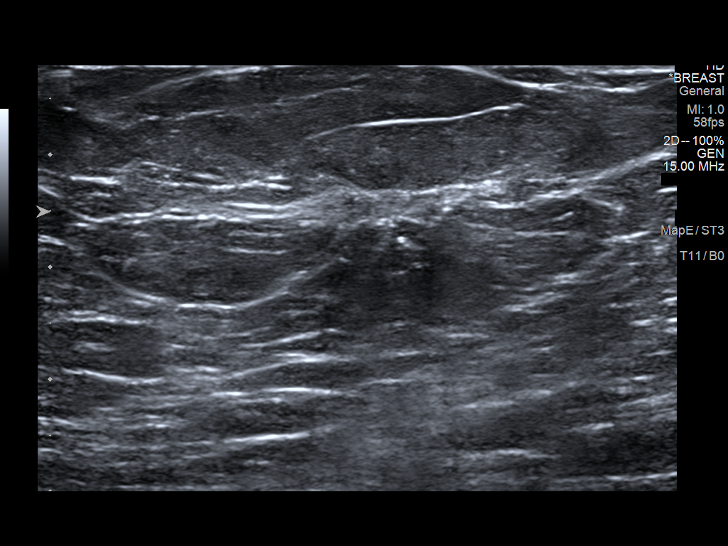
[im 6/20]
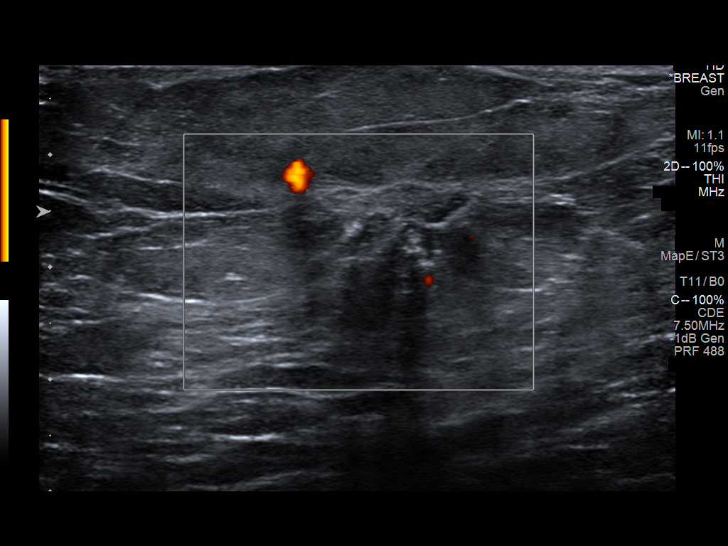
[im 7/20]
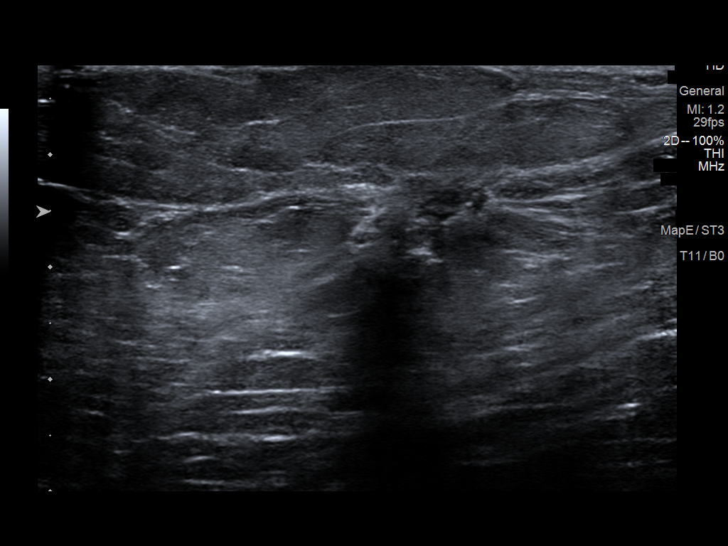
[im 9/20]
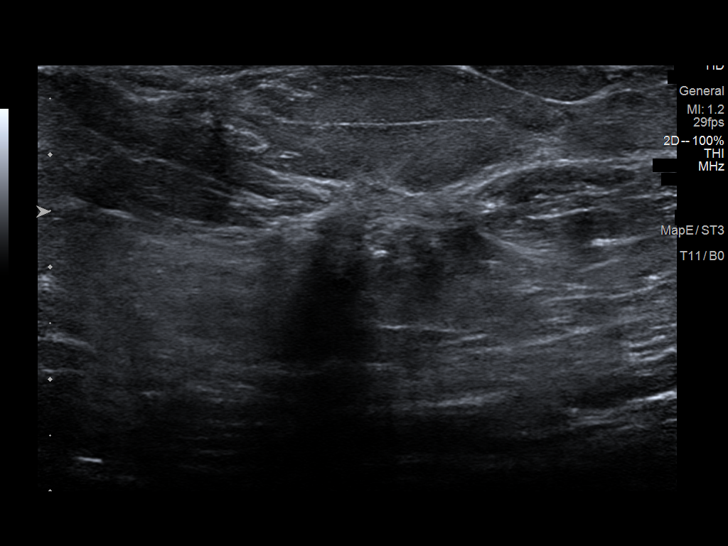
[im 11/20]
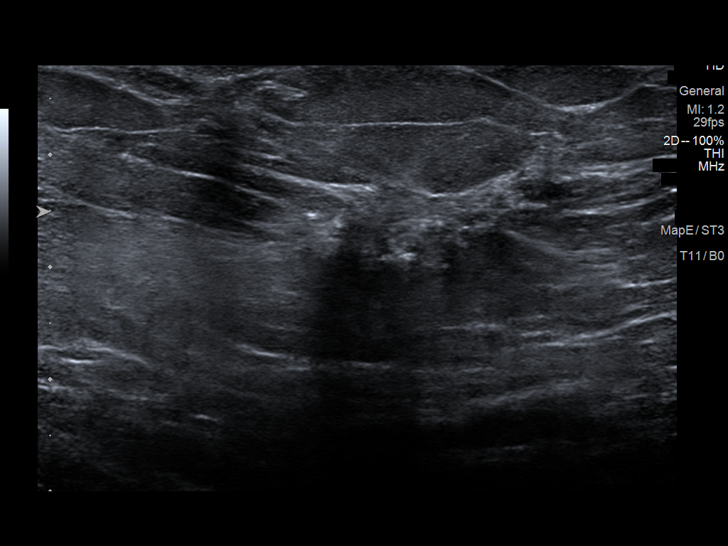
[im 12/20]
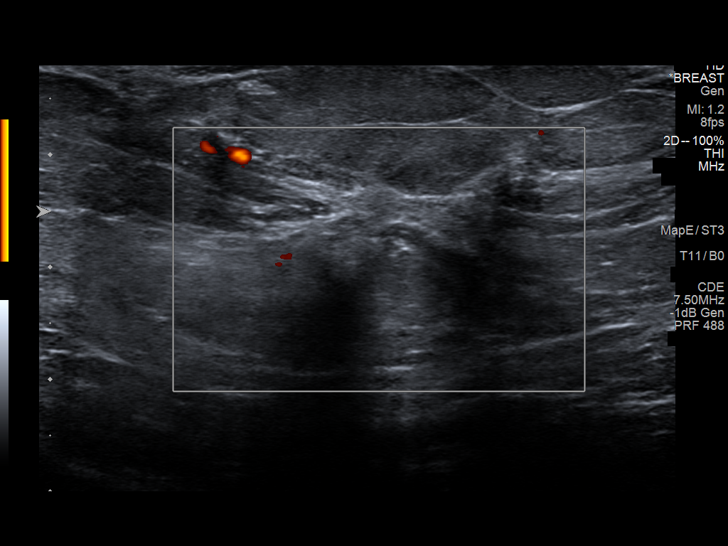
[im 14/20]
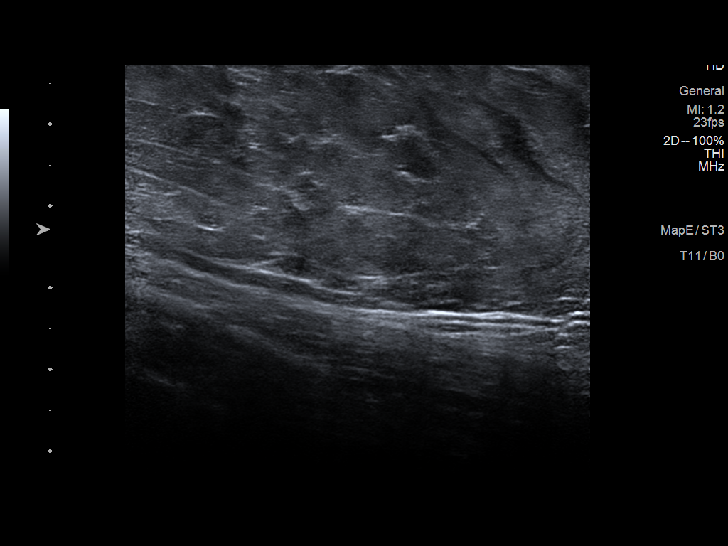
[im 15/20]
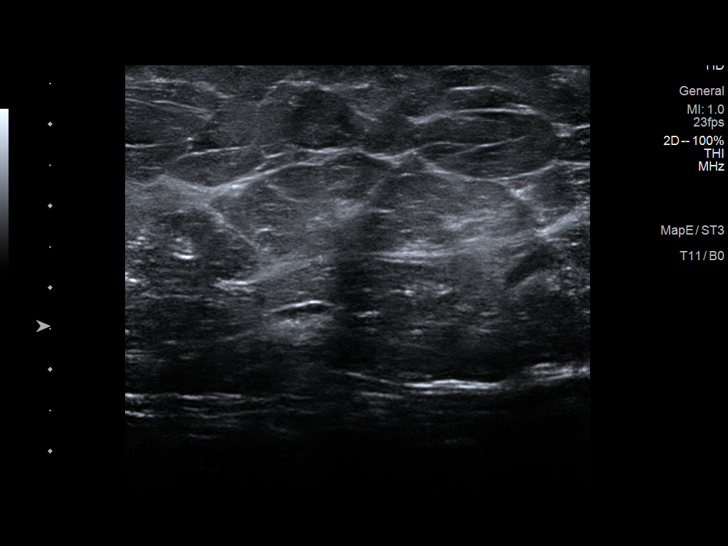
[im 17/20]
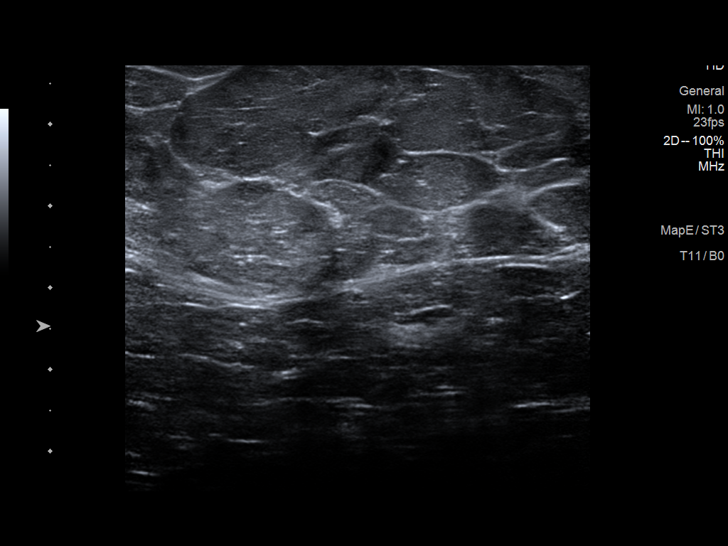
[im 18/20]
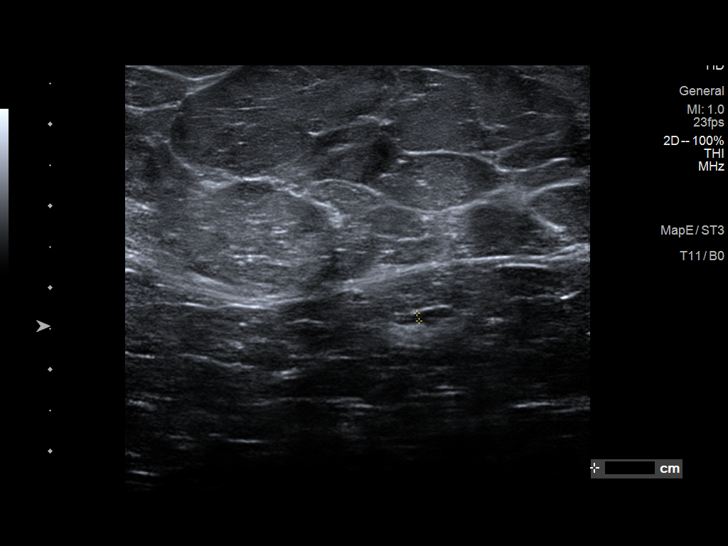
[im 20/20]
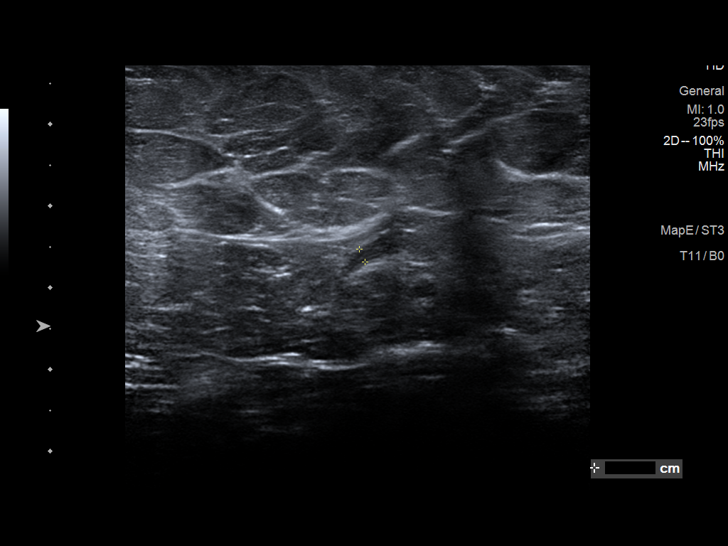

[13 of 20 positions shown; findings below may reference images not displayed]

ACR Breast Density Category b: There are scattered areas of
fibroglandular density.
FINDINGS: Full field tomosynthesis and synthesized CC and MLO views were
obtained. Tomosynthesis images confirm an isodense mass with
interspersed fat associated with architectural distortion in the
LOWER breast at anterior to middle depth. There are no associated
suspicious calcifications.

Targeted ultrasound is performed, showing a vague heterogeneous
though predominantly hypoechoic mass at the 5 o'clock position
approximately 3 cm from nipple, associated with architectural
distortion, measuring approximately 1.9 x 1.2 x 1.3 cm,
demonstrating posterior acoustic shadowing and demonstrating
peripheral power Doppler flow, corresponding to the screening
mammographic.

Sonographic evaluation of the LEFT axilla demonstrates no pathologic
lymphadenopathy.
IMPRESSION: 1. Suspicious 1.9 cm mass with associated architectural distortion
involving the LOWER OUTER QUADRANT of the LEFT breast at the 5
o'clock position approximately 3 cm from nipple.
2. No pathologic LEFT axillary lymphadenopathy.

RECOMMENDATION:
Ultrasound-guided core needle biopsy of the suspicious LEFT breast
mass.

The ultrasound core needle biopsy procedure was discussed with
patient and her questions were answered. She wishes proceed, and the
biopsy has been scheduled for tomorrow, 05/10/2020 at [DATE] a.m.

I have discussed the findings and recommendations with the patient.

BI-RADS CATEGORY  4: Suspicious.

## 2022-11-18 IMAGING — MR MR BREAST BILAT WO/W CM
8 of 12 series · 33 of 48 positions shown · IV contrast (10 ml Gadavist)
Comparison: Previous exam(s).

CLINICAL DATA: Patient with recently diagnosed invasive mammary
carcinoma the left breast. Current exam is to assess for extent
disease.

LABS:  No labs drawn at time of imaging.
EXAM:
BILATERAL BREAST MRI WITH AND WITHOUT CONTRAST
TECHNIQUE: Multiplanar, multisequence MR images of both breasts were obtained
prior to and following the intravenous administration of 10 ml of
Gadavist

[Series 3: t2_tirm_tra ipat (a-p) · axial · 3.0mm · 0.88mm/px · 1 of 67 slices shown]
[im 1/67]
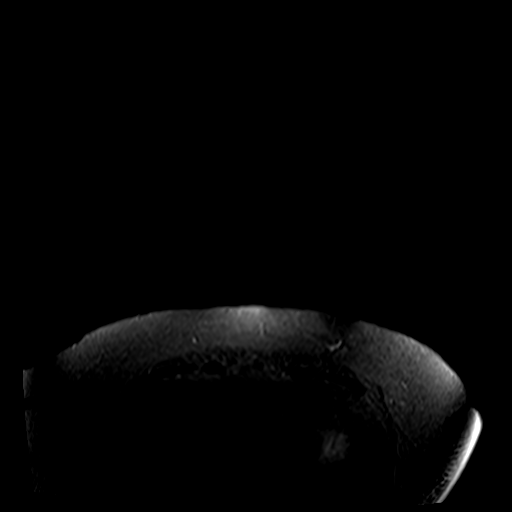

[Series 4: fl3d pre-cm no · axial · non-contrast · 1.2mm · 1.17mm/px · z∈[-66,+144]mm · 5 of 176 slices shown]
[im 1/176]
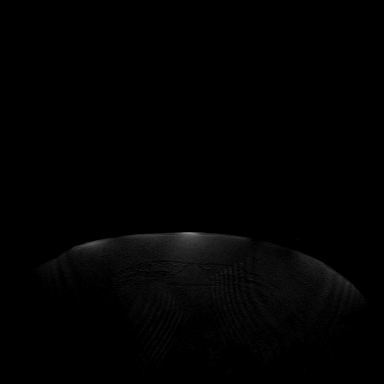
[im 44/176]
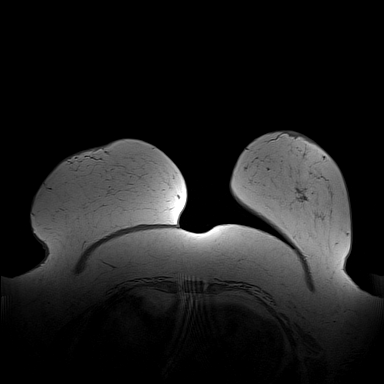
[im 88/176]
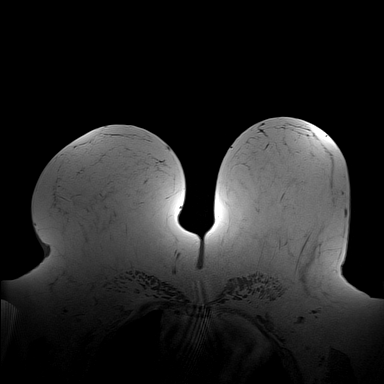
[im 132/176]
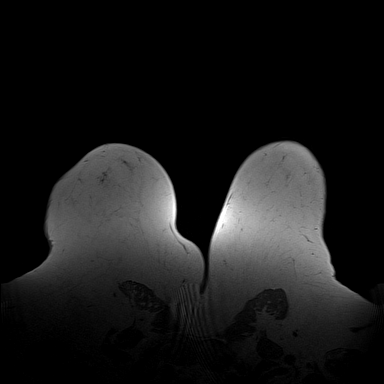
[im 176/176]
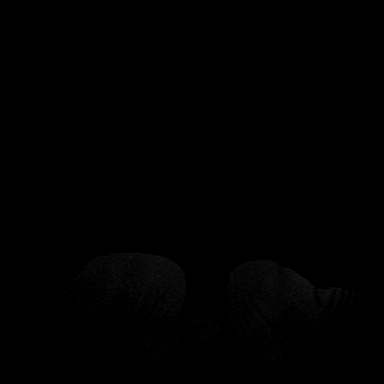

[Series 5: fl3d pre-cm · axial · non-contrast · 1.2mm · 1.17mm/px · z∈[-66,+144]mm · 5 of 176 slices shown]
[im 1/176]
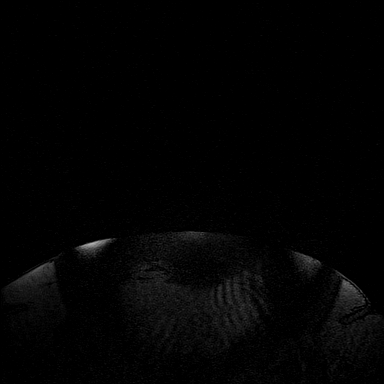
[im 44/176]
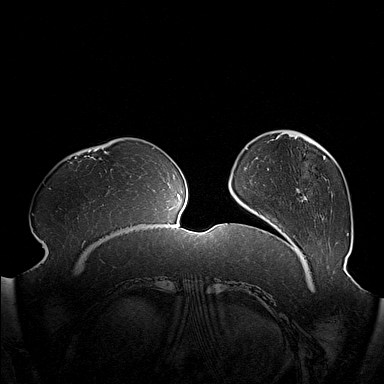
[im 88/176]
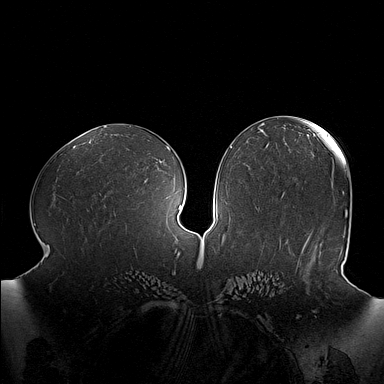
[im 132/176]
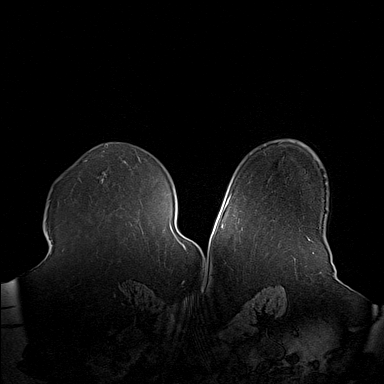
[im 176/176]
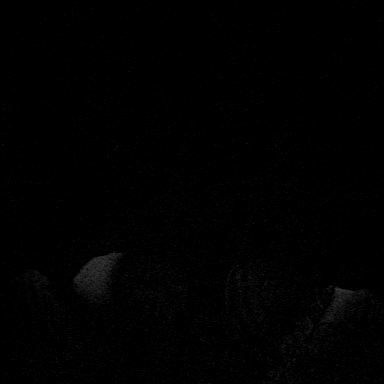

[Series 6: fl3d post-cm 20 · axial · 1.2mm · 1.17mm/px · z∈[-66,+144]mm · 5 of 176 slices shown (1 of 3)]
[im 1/176]
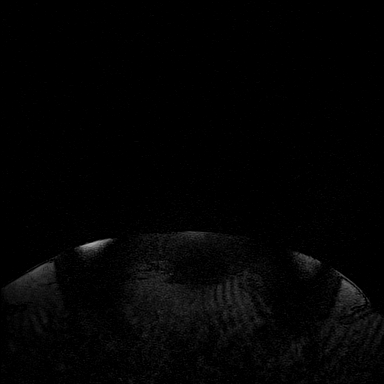
[im 44/176]
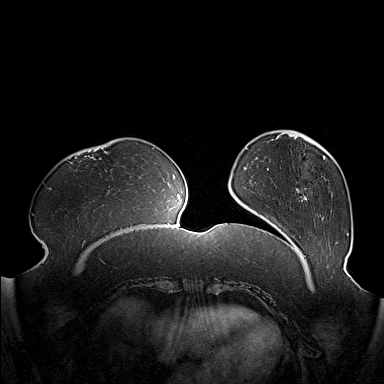
[im 88/176]
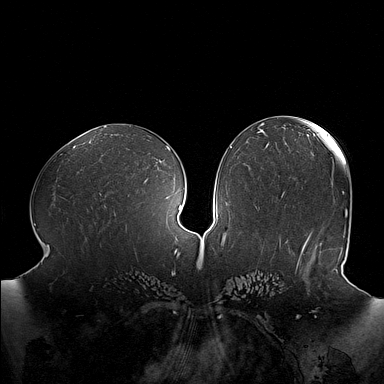
[im 132/176]
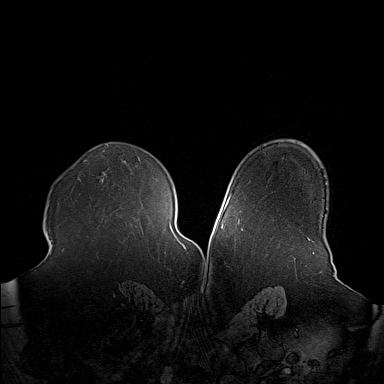
[im 176/176]
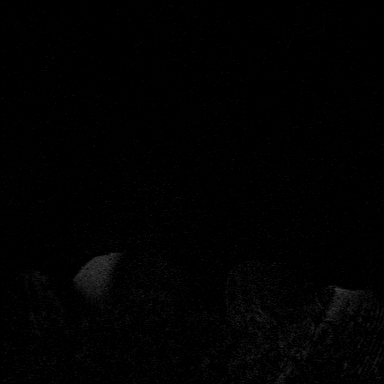

[Series 7: fl3d post-cm 20 · axial · 1.2mm · 1.17mm/px · z∈[-66,+144]mm · 5 of 176 slices shown (2 of 3)]
[im 1/176]
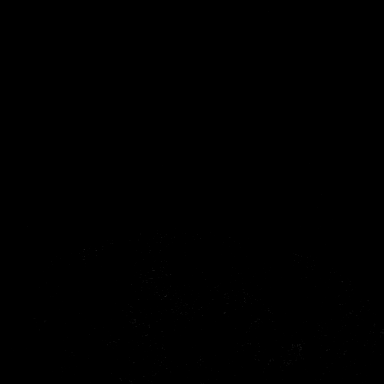
[im 44/176]
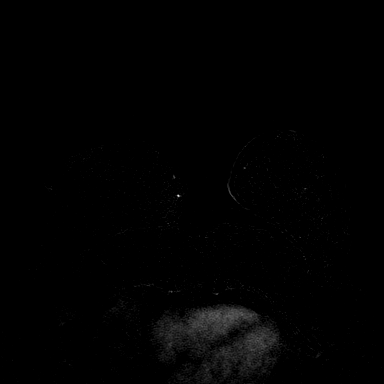
[im 88/176]
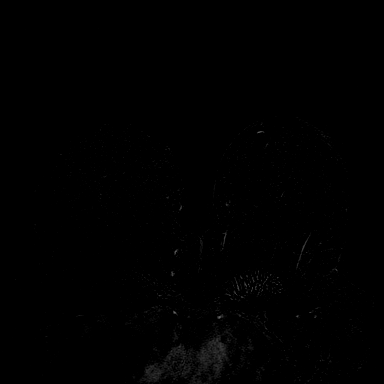
[im 132/176]
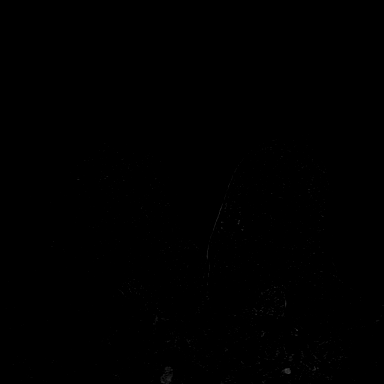
[im 176/176]
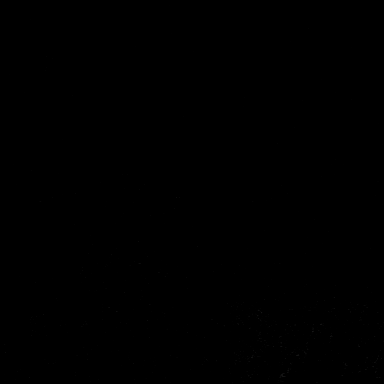

[Series 8: fl3d post-cm 20 · axial · 211.2mm · 1.17mm/px · 1 of 1 slices shown (3 of 3)]
[im 1/1]
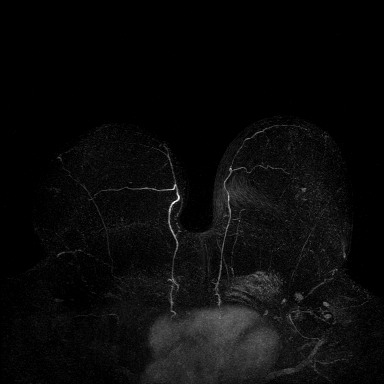

[Series 9: fl3d post-cm 3min · axial · 1.2mm · 1.17mm/px · z∈[-66,+144]mm · 6 of 176 slices shown]
[im 1/176]
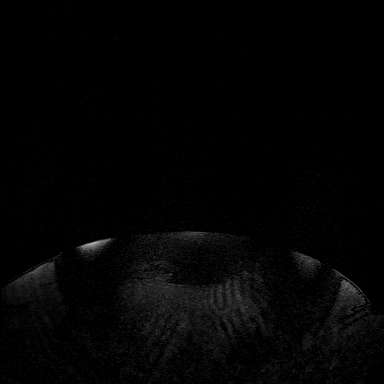
[im 36/176]
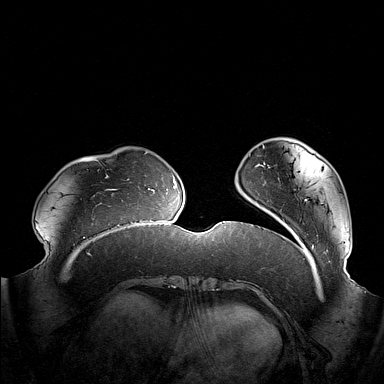
[im 71/176]
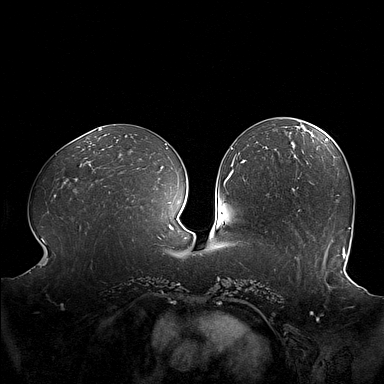
[im 106/176]
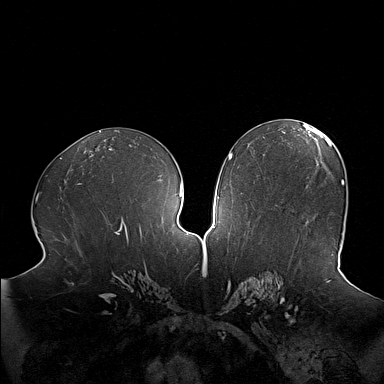
[im 141/176]
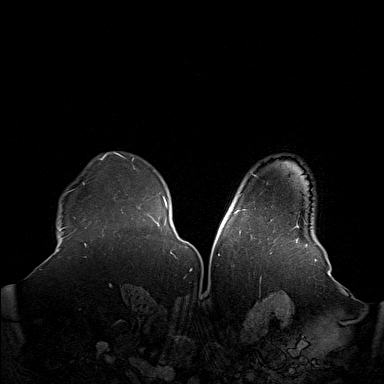
[im 176/176]
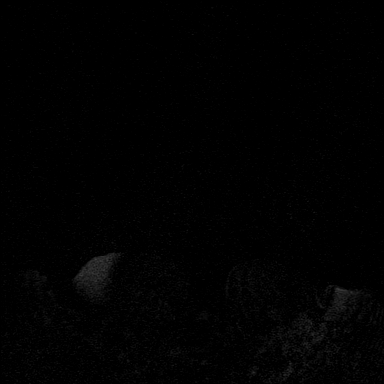

[Series 10: fl3d post-cm 3min_sub · axial · 1.2mm · 1.17mm/px · z∈[-66,+102]mm · 5 of 176 slices shown]
[im 1/176]
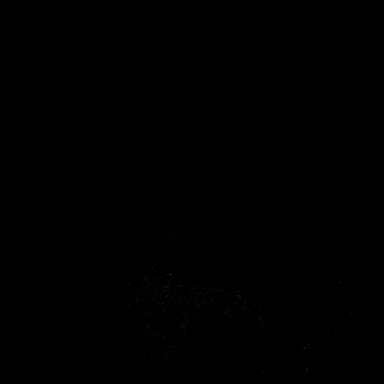
[im 36/176]
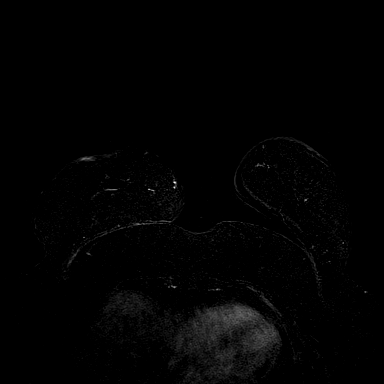
[im 71/176]
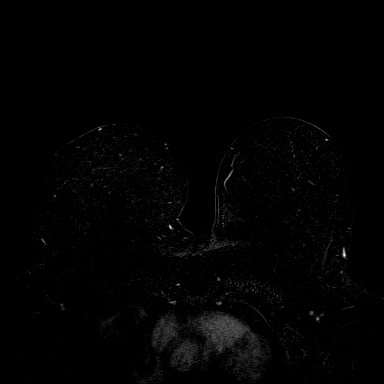
[im 106/176]
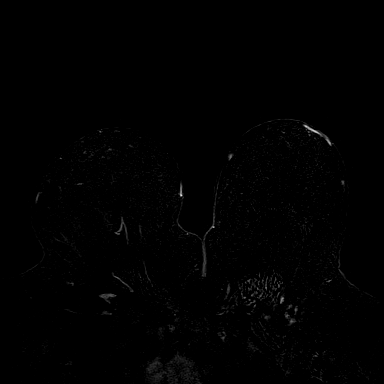
[im 141/176]
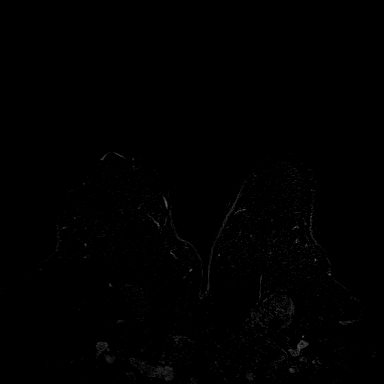

[33 of 48 positions shown; findings below may reference images not displayed]

Three-dimensional MR images were rendered by post-processing of the
original MR data on an independent workstation. The
three-dimensional MR images were interpreted, and findings are
reported in the following complete MRI report for this study. Three
dimensional images were evaluated at the independent interpreting
workstation using the DynaCAD thin client.
FINDINGS: Breast composition: a. Almost entirely fat.

Background parenchymal enhancement: Minimal

Right breast: No mass or abnormal enhancement.

Left breast: Heterogeneous enhancement surrounds the post biopsy
marker clip artifact along the inferior slightly lateral aspect of
the left breast, with enhancement spanning 1.5 x 1.0 x 1.2 cm,
corresponding to the biopsy proven invasive carcinoma.

There are no other masses or areas of abnormal left breast
enhancement.

Lymph nodes: No abnormal appearing lymph nodes.

Ancillary findings:  None.
IMPRESSION: 1. 1.5 cm heterogeneously enhancing, biopsy-proven, carcinoma in the
lower slightly outer aspect of the left breast, at 5 o'clock.
2. No evidence of additional left breast carcinoma.
3. No evidence of right breast carcinoma.
4. No abnormal lymph nodes.

RECOMMENDATION:
1. Treatment as planned for the known left breast carcinoma.

BI-RADS CATEGORY  6: Known biopsy-proven malignancy.

## 2023-01-17 ENCOUNTER — Inpatient Hospital Stay: Payer: BC Managed Care – PPO | Attending: Nurse Practitioner

## 2023-01-17 ENCOUNTER — Inpatient Hospital Stay: Payer: BC Managed Care – PPO | Admitting: Nurse Practitioner

## 2023-01-17 ENCOUNTER — Encounter: Payer: Self-pay | Admitting: Nurse Practitioner

## 2023-01-17 ENCOUNTER — Other Ambulatory Visit: Payer: Self-pay

## 2023-01-17 VITALS — BP 130/70 | HR 59 | Temp 97.9°F | Resp 18 | Ht 68.5 in | Wt 306.2 lb

## 2023-01-17 DIAGNOSIS — C50512 Malignant neoplasm of lower-outer quadrant of left female breast: Secondary | ICD-10-CM

## 2023-01-17 DIAGNOSIS — Z1721 Progesterone receptor positive status: Secondary | ICD-10-CM | POA: Diagnosis not present

## 2023-01-17 DIAGNOSIS — Z170 Estrogen receptor positive status [ER+]: Secondary | ICD-10-CM

## 2023-01-17 DIAGNOSIS — C50212 Malignant neoplasm of upper-inner quadrant of left female breast: Secondary | ICD-10-CM | POA: Diagnosis present

## 2023-01-17 LAB — CBC WITH DIFFERENTIAL (CANCER CENTER ONLY)
Abs Immature Granulocytes: 0.01 10*3/uL (ref 0.00–0.07)
Basophils Absolute: 0 10*3/uL (ref 0.0–0.1)
Basophils Relative: 0 %
Eosinophils Absolute: 0.1 10*3/uL (ref 0.0–0.5)
Eosinophils Relative: 2 %
HCT: 38.8 % (ref 36.0–46.0)
Hemoglobin: 13.4 g/dL (ref 12.0–15.0)
Immature Granulocytes: 0 %
Lymphocytes Relative: 29 %
Lymphs Abs: 1.8 10*3/uL (ref 0.7–4.0)
MCH: 30 pg (ref 26.0–34.0)
MCHC: 34.5 g/dL (ref 30.0–36.0)
MCV: 87 fL (ref 80.0–100.0)
Monocytes Absolute: 0.5 10*3/uL (ref 0.1–1.0)
Monocytes Relative: 9 %
Neutro Abs: 3.7 10*3/uL (ref 1.7–7.7)
Neutrophils Relative %: 60 %
Platelet Count: 229 10*3/uL (ref 150–400)
RBC: 4.46 MIL/uL (ref 3.87–5.11)
RDW: 12.8 % (ref 11.5–15.5)
WBC Count: 6.2 10*3/uL (ref 4.0–10.5)
nRBC: 0 % (ref 0.0–0.2)

## 2023-01-17 LAB — CMP (CANCER CENTER ONLY)
ALT: 15 U/L (ref 0–44)
AST: 16 U/L (ref 15–41)
Albumin: 3.9 g/dL (ref 3.5–5.0)
Alkaline Phosphatase: 78 U/L (ref 38–126)
Anion gap: 6 (ref 5–15)
BUN: 15 mg/dL (ref 8–23)
CO2: 31 mmol/L (ref 22–32)
Calcium: 9.1 mg/dL (ref 8.9–10.3)
Chloride: 104 mmol/L (ref 98–111)
Creatinine: 0.79 mg/dL (ref 0.44–1.00)
GFR, Estimated: >60 mL/min (ref >60)
Glucose, Bld: 107 mg/dL — ABNORMAL HIGH (ref 70–99)
Potassium: 3.7 mmol/L (ref 3.5–5.1)
Sodium: 141 mmol/L (ref 135–145)
Total Bilirubin: 0.5 mg/dL (ref <1.2)
Total Protein: 6.7 g/dL (ref 6.5–8.1)

## 2023-01-17 NOTE — Progress Notes (Signed)
Patient Care Team: Camie Patience, FNP as PCP - General (Family Medicine) Donnelly Angelica, RN as Oncology Nurse Navigator Pershing Proud, RN as Oncology Nurse Navigator Malachy Mood, MD as Consulting Physician (Hematology) Griselda Miner, MD as Consulting Physician (General Surgery) Dorothy Puffer, MD as Consulting Physician (Radiation Oncology) Pollyann Samples, NP as Nurse Practitioner (Nurse Practitioner)   CHIEF COMPLAINT: Follow-up left breast cancer  Oncology History Overview Note  Cancer Staging Malignant neoplasm of lower-outer quadrant of left breast of female, estrogen receptor positive (HCC) Staging form: Breast, AJCC 8th Edition - Clinical stage from 05/10/2020: Stage IA (cT1c, cN0, cM0, G2, ER+, PR+, HER2-) - Signed by Loa Socks, NP on 05/18/2020 Histologic grading system: 3 grade system - Pathologic stage from 08/03/2020: Stage IA (pT2, pN0(sn), cM0, G2, ER+, PR+, HER2-, Oncotype DX score: 15) - Signed by Malachy Mood, MD on 09/14/2020 Method of lymph node assessment: Sentinel lymph node biopsy Multigene prognostic tests performed: Oncotype DX Recurrence score range: Greater than or equal to 11 Histologic grading system: 3 grade system    Malignant neoplasm of lower-outer quadrant of left breast of female, estrogen receptor positive (HCC)  05/09/2020 Breast US   IMPRESSION: 1. Suspicious 1.9 cm mass with associated architectural distortion involving the LOWER OUTER QUADRANT of the LEFT breast at the 5 o'clock position approximately 3 cm from nipple. 2. No pathologic LEFT axillary lymphadenopathy.   05/10/2020 Cancer Staging   Staging form: Breast, AJCC 8th Edition - Clinical stage from 05/10/2020: Stage IA (cT1c, cN0, cM0, G2, ER+, PR+, HER2-) - Signed by Loa Socks, NP on 05/18/2020 Histologic grading system: 3 grade system   05/10/2020 Initial Biopsy   Diagnosis Breast, left, needle core biopsy, 5 o'clock - INVASIVE MAMMARY CARCINOMA - MAMMARY  CARCINOMA IN-SITU - SEE COMMENT Microscopic Comment The biopsy material shows an infiltrative proliferation of cells arranged linearly and in small clusters. Based on the biopsy, the carcinoma appears Nottingham grade 2 of 3 and measures 1.5 cm in greatest linear extent. The tumor cells are NEGATIVE for Her2 (1+).  Estrogen Receptor: 80%, POSITIVE, STRONG-MODERATE STAINING INTENSITY Progesterone Receptor: 90%, POSITIVE, STRONG STAINING INTENSITY Proliferation Marker Ki67: 10% E-cadherin is NEGATIVE supporting lobular origin.   05/18/2020 Initial Diagnosis   Malignant neoplasm of lower-outer quadrant of left breast of female, estrogen receptor positive (HCC)   05/31/2020 Imaging   Breast MRI IMPRESSION: 1. 1.5 cm heterogeneously enhancing, biopsy-proven, carcinoma in the lower slightly outer aspect of the left breast, at 5 o'clock. 2. No evidence of additional left breast carcinoma. 3. No evidence of right breast carcinoma. 4. No abnormal lymph nodes   06/06/2020 Genetic Testing   No pathogenic variants detected in [facility] CancerNext-Expanded +RNAinsight Panel.  The report date is Jun 13, 2020.  The CancerNext-Expanded gene panel offered by [facility] and includes sequencing, rearrangement, and RNA analysis for the following 77 genes: AIP, ALK, APC, ATM, AXIN2, BAP1, BARD1, BLM, BMPR1A, BRCA1, BRCA2, BRIP1, CDC73, CDH1, CDK4, CDKN1B, CDKN2A, CHEK2, CTNNA1, DICER1, FANCC, FH, FLCN, GALNT12, KIF1B, LZTR1, MAX, MEN1, MET, MLH1, MSH2, MSH3, MSH6, MUTYH, NBN, NF1, NF2, NTHL1, PALB2, PHOX2B, PMS2, POT1, PRKAR1A, PTCH1, PTEN, RAD51C, RAD51D, RB1, RECQL, RET, SDHA, SDHAF2, SDHB, SDHC, SDHD, SMAD4, SMARCA4, SMARCB1, SMARCE1, STK11, SUFU, TMEM127, TP53, TSC1, TSC2, VHL and XRCC2 (sequencing and deletion/duplication); EGFR, EGLN1, HOXB13, KIT, MITF, PDGFRA, POLD1, and POLE (sequencing only); EPCAM and GREM1 (deletion/duplication only).    06/29/2020 Pathology Results   FINAL MICROSCOPIC DIAGNOSIS:   A.  BREAST,  LEFT, LUMPECTOMY:  - Invasive lobular carcinoma, grade 2, spanning 2.1 cm.  - Lobular carcinoma in situ.  - Biopsy site.  - Margins are negative for carcinoma.  - See oncology table.   B. LYMPH NODE, LEFT #1, SENTINEL, EXCISION:  - One of one lymph nodes negative for carcinoma (0/1).   C. LYMPH NODE, LEFT, SENTINEL, EXCISION:  - One of one lymph nodes negative for carcinoma (0/1).   D. LYMPH NODE, LEFT, SENTINEL, EXCISION:  - One of one lymph nodes negative for carcinoma (0/1).   E. LYMPH NODE, LEFT #2, SENTINEL, EXCISION:  - One of one lymph nodes negative for carcinoma (0/1).   06/29/2020 Oncotype testing   Recurrence score of 15 predicts a risk of recurrence outside the breast over the next 9 years of 4%, if the patient's only systemic therapy is an antiestrogen for 5 years.    08/03/2020 Cancer Staging   Staging form: Breast, AJCC 8th Edition - Pathologic stage from 08/03/2020: Stage IA (pT2, pN0(sn), cM0, G2, ER+, PR+, HER2-, Oncotype DX score: 15) - Signed by Malachy Mood, MD on 09/14/2020 Method of lymph node assessment: Sentinel lymph node biopsy Multigene prognostic tests performed: Oncotype DX Recurrence score range: Greater than or equal to 11 Histologic grading system: 3 grade system   08/18/2020 - 09/06/2020 Radiation Therapy   Completed adjuvant radiation per Dr. Mitzi Hansen   11/2020 - 12/2020 Anti-estrogen oral therapy   Did not tolerate exemestane or letrozole due to severe hot flashes and knee pain   01/23/2021 Survivorship   SCP delivered by Santiago Glad, NP      CURRENT THERAPY:  Did not tolerate exemestane or letrozole due to arthritis and severe hot flashes, she stopped in 12/2020.  On tamoxifen since 04/2021.   INTERVAL HISTORY Ms. Anna Martinez returns for follow-up as scheduled, last seen by me 07/18/2022. She has worsening hair loss and joint pain, mainly R knee and both hips making it difficult to stand up and turn over in bed. Pain occurs daily and is affecting  her ability to exercise and lead a healthy/active life. She would like to stop tamoxifen and focus on healthy lifestyle as a means of risk reduction.   ROS  All other systems reviewed and negative  Past Medical History:  Diagnosis Date   Abnormal Pap smear 08/13/2012   LSIL may include HPV or mild Dysplasia (CIN1)   Acute lateral meniscus tear of right knee, subsequent encounter    Breast cancer (HCC) 05/2020   LEFT   Depression    Family history of breast cancer 05/30/2020   Family history of pancreatic cancer 05/30/2020   Family history of prostate cancer 05/30/2020   Family history of renal cancer 05/30/2020   Family history of skin cancer 05/30/2020   Gallbladder problem    Heart murmur    cardiologist a long time ago ECHO not completed   Hypertension    on meds   Knee pain    Lateral meniscus tear    right knee   Obesity    Other fatigue    Personal history of radiation therapy    Pre-diabetes    Shortness of breath on exertion    Sleep apnea    uses CPAP nightly   Vitamin D deficiency      Past Surgical History:  Procedure Laterality Date   BREAST LUMPECTOMY Left 06/29/2020   BREAST LUMPECTOMY WITH RADIOACTIVE SEED AND SENTINEL LYMPH NODE BIOPSY Left 06/29/2020   Procedure: LEFT BREAST LUMPECTOMY WITH RADIOACTIVE  SEED;  Surgeon: Griselda Miner, MD;  Location: [facility] OR;  Service: General;  Laterality: Left;   CYST REMOVAL TRUNK Left 06/29/2020   Procedure: REMOVAL OF SEBACEOUS CYST;  Surgeon: Jerene Bears, MD;  Location: [facility] OR;  Service: Gynecology;  Laterality: Left;   HYSTEROSCOPY WITH D & C N/A 06/29/2020   Procedure: DILATATION AND CURETTAGE /HYSTEROSCOPY WITH MYOSURE;  Surgeon: Jerene Bears, MD;  Location: [facility] OR;  Service: Gynecology;  Laterality: N/A;   KNEE ARTHROSCOPY WITH LATERAL MENISECTOMY Right 10/03/2020   Procedure: KNEE ARTHROSCOPY WITH LATERAL MENISECTOMY;  Surgeon: Salvatore Marvel, MD;  Location: [facility];  Service: Orthopedics;   Laterality: Right;   LAPAROSCOPIC CHOLECYSTECTOMY  2001   SENTINEL NODE BIOPSY Left 06/29/2020   Procedure: LEFT SENTINEL LYMPH NODE BIOPSY;  Surgeon: Griselda Miner, MD;  Location: [facility] OR;  Service: General;  Laterality: Left;     Outpatient Encounter Medications as of 01/17/2023  Medication Sig Note   fluconazole (DIFLUCAN) 200-0.9 MG/100ML-% IVPB 1 tab every 3 days for 4 doses    mometasone (ELOCON) 0.1 % ointment APPLY TOPICALLY TO THE AFFECTED AREA AS DIRECTED ONCE OR TWICE WEEKLY    nystatin cream (MYCOSTATIN) Apply 1 Application topically 2 (two) times daily. Apply to affected area BID for up to 7 days.    valsartan-hydrochlorothiazide (DIOVAN-HCT) 320-25 MG tablet Take 1 tablet by mouth daily.    VITAMIN D PO Take 1 tablet by mouth daily at 6 (six) AM.    [DISCONTINUED] tamoxifen (NOLVADEX) 20 MG tablet TAKE 1 TABLET(20 MG) BY MOUTH DAILY 01/17/2023: SE's   No facility-administered encounter medications on file as of 01/17/2023.     Today's Vitals   01/17/23 1153 01/17/23 1154  BP: (!) 167/77 130/70  Pulse: (!) 59   Resp: 18   Temp: 97.9 F (36.6 C)   TempSrc: Temporal   SpO2: 99%   Weight: (!) 306 lb 3.2 oz (138.9 kg)   Height: 5' 8.5" (1.74 m)   PainSc: 3     Body mass index is 45.88 kg/m.   PHYSICAL EXAM GENERAL:alert, no distress and comfortable SKIN: no rash  EYES: sclera clear NECK: without mass LYMPH:  no palpable cervical or supraclavicular lymphadenopathy  LUNGS:  normal breathing effort HEART:  no lower extremity edema ABDOMEN: abdomen soft, non-tender and normal bowel sounds NEURO: alert & oriented x 3 with fluent speech, no focal motor/sensory deficits Breast exam: No nipple discharge or inversion.  S/p left lumpectomy, incisions completely healed.  No palpable mass or nodularity in either breast or axilla that I could appreciate   CBC    Component Value Date/Time   WBC 6.2 01/17/2023 1129   WBC 6.5 07/18/2022 1029   RBC 4.46 01/17/2023 1129    HGB 13.4 01/17/2023 1129   HGB 14.5 04/07/2020 0946   HCT 38.8 01/17/2023 1129   HCT 44.6 04/07/2020 0946   PLT 229 01/17/2023 1129   PLT 316 04/07/2020 0946   MCV 87.0 01/17/2023 1129   MCV 86 04/07/2020 0946   MCH 30.0 01/17/2023 1129   MCHC 34.5 01/17/2023 1129   RDW 12.8 01/17/2023 1129   RDW 13.3 04/07/2020 0946   LYMPHSABS 1.8 01/17/2023 1129   LYMPHSABS 2.2 04/07/2020 0946   MONOABS 0.5 01/17/2023 1129   EOSABS 0.1 01/17/2023 1129   EOSABS 0.2 04/07/2020 0946   BASOSABS 0.0 01/17/2023 1129   BASOSABS 0.0 04/07/2020 0946     CMP     Component Value Date/Time  NA 141 01/17/2023 1129   NA 140 01/19/2021 0834   K 3.7 01/17/2023 1129   CL 104 01/17/2023 1129   CO2 31 01/17/2023 1129   GLUCOSE 107 (H) 01/17/2023 1129   BUN 15 01/17/2023 1129   BUN 16 01/19/2021 0834   CREATININE 0.79 01/17/2023 1129   CALCIUM 9.1 01/17/2023 1129   PROT 6.7 01/17/2023 1129   PROT 6.8 01/19/2021 0834   ALBUMIN 3.9 01/17/2023 1129   ALBUMIN 4.5 01/19/2021 0834   AST 16 01/17/2023 1129   ALT 15 01/17/2023 1129   ALKPHOS 78 01/17/2023 1129   BILITOT 0.5 01/17/2023 1129   GFRNONAA >60 01/17/2023 1129     ASSESSMENT & PLAN: Anna Martinez is a 62 y.o. female with    1. Malignant neoplasm in left lower outer breast - invasive lobular carcinoma, grade II, ER/PR positive HER2 negative, Ki67 10%, pT2N0M0, stage I -Diagnosed 05/10/2020 with invasive lobular carcinoma s/p lumpectomy on 06/29/20 by Dr. Carolynne Edouard. Pathology showed 2.1 cm invasive and in situ lobular carcinoma, margins and lymph nodes negative. Oncotype recurrence score of 15, low risk.  Adjuvant chemotherapy is not recommended. -She completed adjuvant radiation therapy 09/14/2020 by Dr. Mitzi Hansen.   -Given the strong ER and PR expression in her postmenopausal status, adjuvant endocrine therapy with AI was recommended for a total of 7-10 years (lobular histology) to reduce the risk of cancer recurrence. she did not tolerate exemestane or  letrozole due to arthritis and severe hot flashes, she stopped in 12/2020.  -She was able to come off Wellbutrin and start tamoxifen in 04/2021.  Tolerating better of the others she has tried -Ms. Guthridge is clinically doing well.  She has developed intolerable side effects to tamoxifen, mainly hair loss and joint pain making daily activities and exercise difficult. She would like to stop tamoxifen -I offered 20 mg every other day or reduced dose 10 mg daily versus trial of anastrozole which she has not done yet.  She prefers to remain off antiestrogen altogether and focus on healthy lifestyle as the primary means of risk reduction -She understands her recurrence risk has likely increased with stopping antiestrogen.  -Continue surveillance, annual mammograms in April.  She is interested in screening MRI if insurance approves, will aim for 11/2023 -Follow-up in 1 year, or sooner if needed   2. Genetics -patient has personal history of sq.cell skin and now breast cancer. Her father had multiple cancers (prostate, kidney, skin, and palate), and she has family history of pancreas cancer and breast cancer in 46 year old cousin.  -testing 05/30/20 was negative -She is concerned about getting other types of cancer, I encouraged her to continue age-appropriate screening, abstain from smoking, limit alcohol, and live a healthy active lifestyle   3. HTN, HL, DM, arthritis, low vitamin D, mood -per PCP Dr. 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  She takes 2000 IUs daily -Weaned off Wellbutrin to start tamoxifen, mood is stable off medication.  She will let me know if/when she would like to restart something -Encouraged her to continue healthy lifestyle and intentional weight loss   4. Health maintenance -last pap 02/10/18 negative, endometrium  curettage 06/29/20 benign (GYN is Dr. Hyacinth Meeker) -Colonoscopy 11/2020     PLAN: -Labs reviewed -D/c Tamoxifen due to SE's and Pt preference. Declined low dose or Anastrozole -  Proceed with surveillance alone -Mammo 05/2023, screening breast MRI 11/2023 if insurance approves -Continue healthy active lifestyle  -F/up in 1 year, or sooner if needed   Orders Placed This Encounter  Procedures   MM DIAG BREAST TOMO BILATERAL    Standing Status:   Future    Expected Date:   05/08/2023    Expiration Date:   01/17/2024    Reason for Exam (SYMPTOM  OR DIAGNOSIS REQUIRED):   h/o lobular breast cancer 2022    Preferred imaging location?:   [facility]      All questions were answered. The patient knows to call the clinic with any problems, questions or concerns. No barriers to learning were detected. I spent 20 minutes counseling the patient face to face. The total time spent in the appointment was 30 minutes and more than 50% was on counseling, review of test results, and coordination of care.   Santiago Glad, NP-C 01/17/2023

## 2023-02-22 IMAGING — MR MR KNEE*R* W/O CM
6 series · 40 of 40 positions shown · non-contrast
Comparison: None.

CLINICAL DATA: Right knee pain extending down into the calf.

EXAM:
MRI OF THE RIGHT KNEE WITHOUT CONTRAST
TECHNIQUE: Multiplanar, multisequence MR imaging of the knee was performed. No
intravenous contrast was administered. Flex coil was utilized due to
body habitus.

[Series 3: T2 fat-sat · axial · right · 4.0mm · 0.62mm/px · z∈[-98,+38]mm · 6 of 32 slices shown (1 of 3)]
[im 1/32]
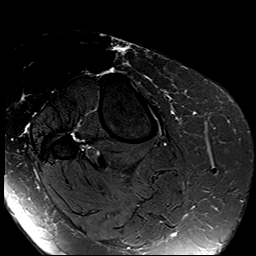
[im 7/32]
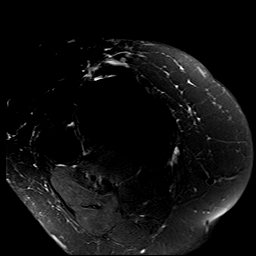
[im 13/32]
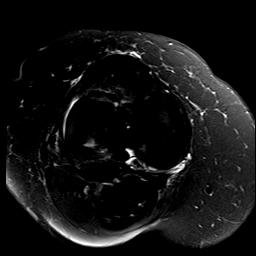
[im 19/32]
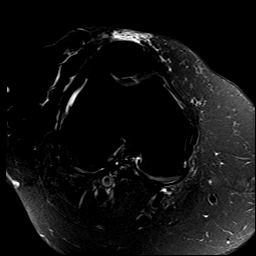
[im 25/32]
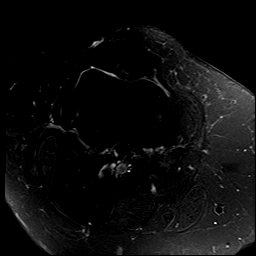
[im 32/32]
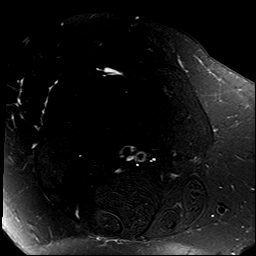

[Series 4: T2 fat-sat · coronal · right · 4.0mm · 0.62mm/px · 7 of 31 slices shown (2 of 3)]
[im 1/31]
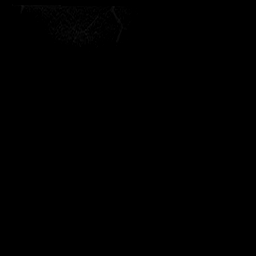
[im 6/31]
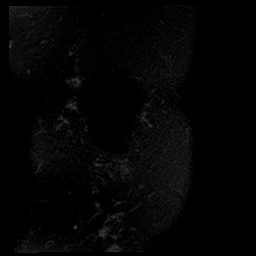
[im 11/31]
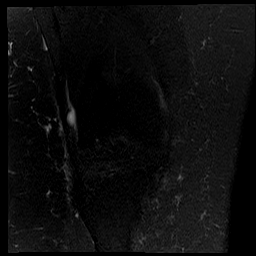
[im 16/31]
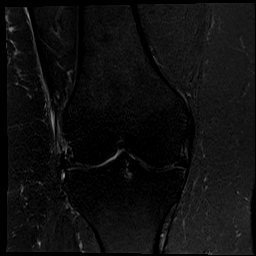
[im 21/31]
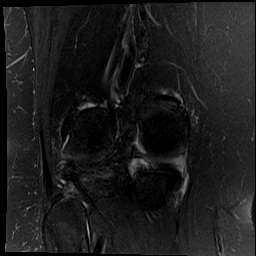
[im 26/31]
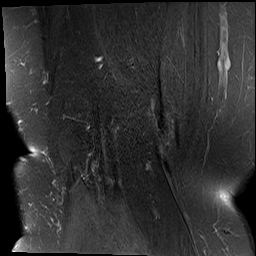
[im 31/31]
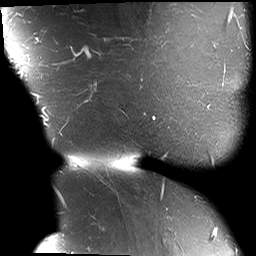

[Series 5: T1 · coronal · right · 4.0mm · 0.50mm/px · 6 of 26 slices shown]
[im 1/26]
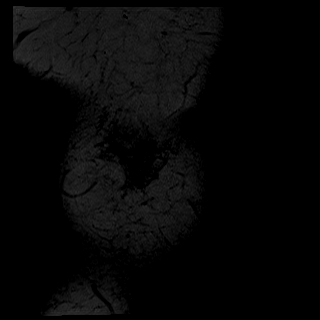
[im 6/26]
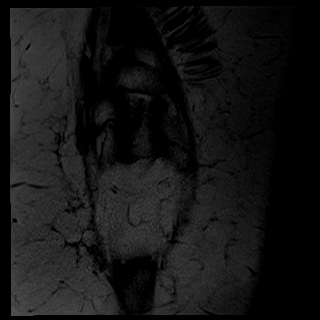
[im 11/26]
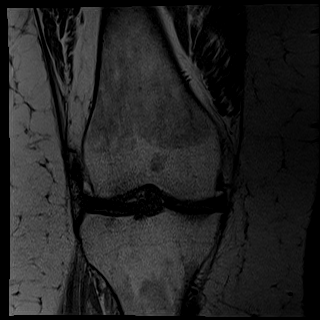
[im 16/26]
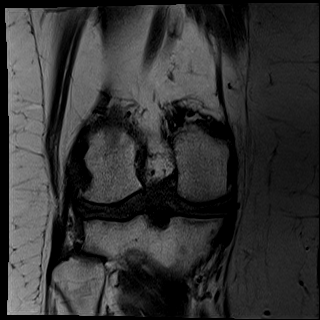
[im 21/26]
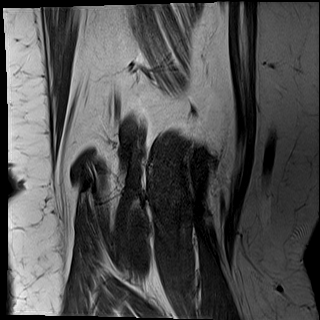
[im 26/26]
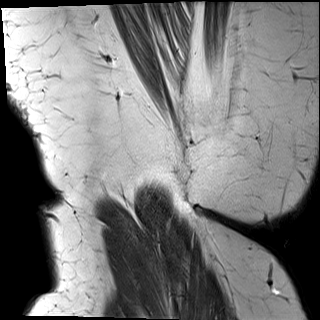

[Series 6: PD fat-sat · coronal · right · 3.3mm · 0.62mm/px · 7 of 31 slices shown (1 of 2)]
[im 1/31]
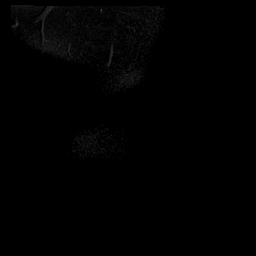
[im 6/31]
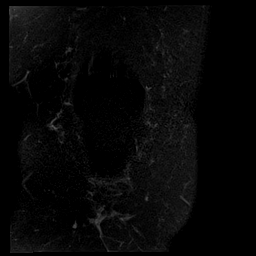
[im 11/31]
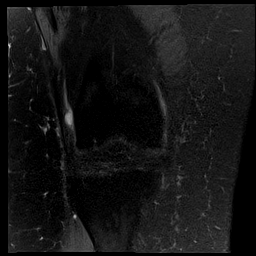
[im 16/31]
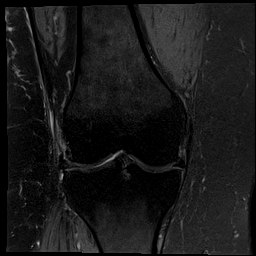
[im 21/31]
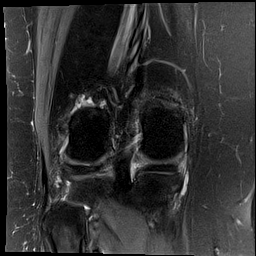
[im 26/31]
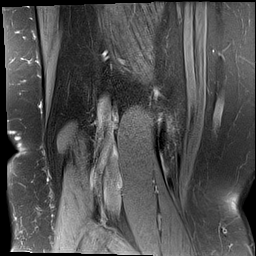
[im 31/31]
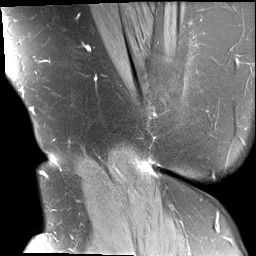

[Series 7: PD fat-sat · sagittal · right · 3.3mm · 0.62mm/px · 7 of 29 slices shown (2 of 2)]
[im 1/29]
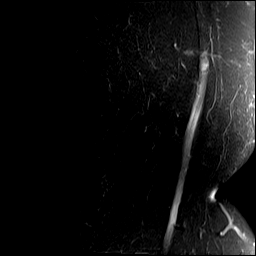
[im 5/29]
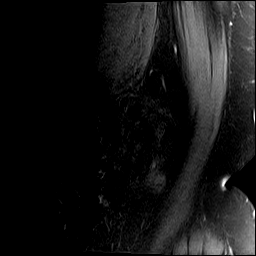
[im 10/29]
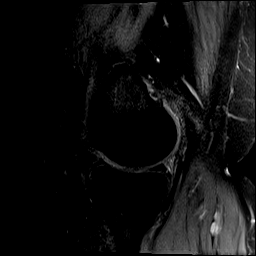
[im 15/29]
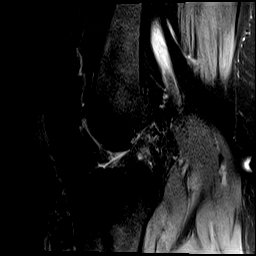
[im 19/29]
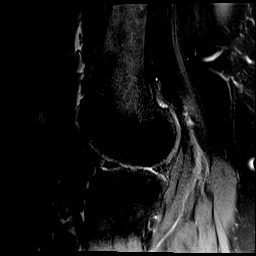
[im 24/29]
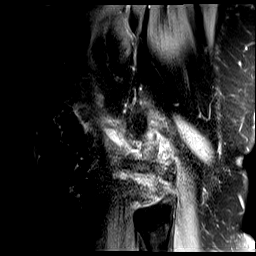
[im 29/29]
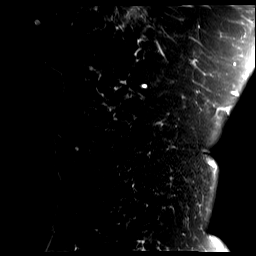

[Series 8: T2 fat-sat · sagittal · right · 3.3mm · 0.62mm/px · 7 of 29 slices shown (3 of 3)]
[im 1/29]
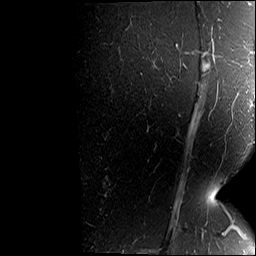
[im 5/29]
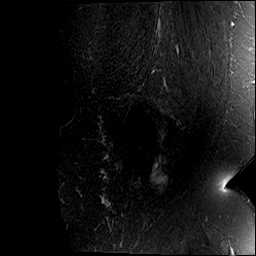
[im 10/29]
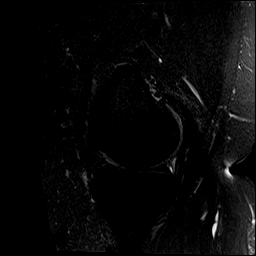
[im 15/29]
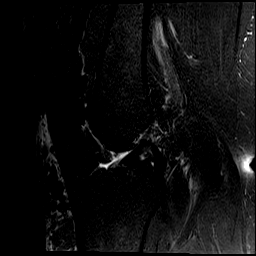
[im 19/29]
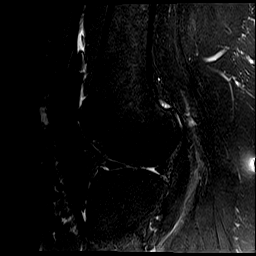
[im 24/29]
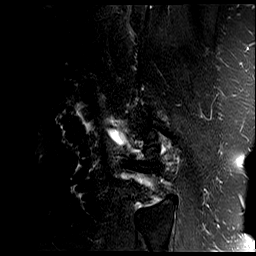
[im 29/29]
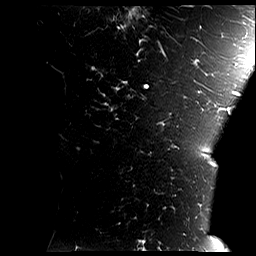

[40 of 40 positions shown; findings below may reference images not displayed]

FINDINGS: MENISCI

Medial meniscus:  Unremarkable

Lateral meniscus: Free edge tear of the posterior horn shown on
image 12 series 6 and image 21 series 7. Possible degenerative
tearing along the free edge of the midbody.

LIGAMENTS

Cruciates:  Unremarkable

Collaterals:  Unremarkable

CARTILAGE

Patellofemoral: Marginal spurring with moderate chondral thinning
along most of the patellofemoral joint, and more striking chondral
thinning along the posterior patellar ridge where there is also
subcortical marrow edema for example on image 9 series 3.

Medial: Mild chondral thinning and mild chondral heterogeneity.
Marginal spurring.

Lateral: Partial thickness focal chondral thinning posteriorly along
the lateral femoral condyle. Marginal spurring.

Joint:  Upper normal amount of fluid in the knee joint.

Popliteal Fossa:  Pes anserine bursitis.  Very small Baker's cyst.

Extensor Mechanism:  Prepatellar subcutaneous edema.

Bones:  Red marrow redistribution is present.

Other: No supplemental non-categorized findings.
IMPRESSION: 1. Free edge tear of the posterior horn lateral meniscus.
2. Moderate osteoarthritis.
3. Pes anserine bursitis.  Very small Baker's cyst.
4. Red marrow redistribution is present. This is nonspecific can be
associated with a variety of conditions including chronic anemia,
chronic heart failure, myelofibrosis, infiltrative marrow disease,
response to infection, and other conditions.

## 2023-04-25 ENCOUNTER — Ambulatory Visit (HOSPITAL_BASED_OUTPATIENT_CLINIC_OR_DEPARTMENT_OTHER): Payer: BC Managed Care – PPO | Admitting: Obstetrics & Gynecology

## 2023-05-07 ENCOUNTER — Encounter (HOSPITAL_BASED_OUTPATIENT_CLINIC_OR_DEPARTMENT_OTHER): Payer: Self-pay | Admitting: Obstetrics & Gynecology

## 2023-05-07 ENCOUNTER — Ambulatory Visit (HOSPITAL_BASED_OUTPATIENT_CLINIC_OR_DEPARTMENT_OTHER): Payer: Self-pay | Admitting: Obstetrics & Gynecology

## 2023-05-07 VITALS — Ht 68.5 in | Wt 324.0 lb

## 2023-05-07 DIAGNOSIS — Z170 Estrogen receptor positive status [ER+]: Secondary | ICD-10-CM

## 2023-05-07 DIAGNOSIS — B372 Candidiasis of skin and nail: Secondary | ICD-10-CM | POA: Diagnosis not present

## 2023-05-07 DIAGNOSIS — L900 Lichen sclerosus et atrophicus: Secondary | ICD-10-CM | POA: Diagnosis not present

## 2023-05-07 DIAGNOSIS — Z780 Asymptomatic menopausal state: Secondary | ICD-10-CM

## 2023-05-07 DIAGNOSIS — C50512 Malignant neoplasm of lower-outer quadrant of left female breast: Secondary | ICD-10-CM | POA: Diagnosis not present

## 2023-05-07 DIAGNOSIS — Z01419 Encounter for gynecological examination (general) (routine) without abnormal findings: Secondary | ICD-10-CM | POA: Diagnosis not present

## 2023-05-07 DIAGNOSIS — N3946 Mixed incontinence: Secondary | ICD-10-CM

## 2023-05-07 MED ORDER — MOMETASONE FUROATE 0.1 % EX OINT: TOPICAL_OINTMENT | CUTANEOUS | 1 refills | Status: AC

## 2023-05-07 NOTE — Progress Notes (Signed)
 ANNUAL EXAM Patient name: Anna Martinez MRN [id number]  Date of birth: 1960-10-24 Chief Complaint:   AEX  History of Present Illness:   Anna Martinez is a 63 y.o. G1P1 Caucasian female being seen today for a routine annual exam.  Denies vaginal bleeding.  H/o lichen sclerosus and she has been treated with topical steroid.    She has been doing some intermittent fasting to help with auto-immune issues.  Feels this has really helped her.  Is not on adjuvant therapy for her breast cancer.  She tried exemestane, tamoxifen, and letrozole.    Patient's last menstrual period was 07/21/2015.  Last pap 03/30/2021. Results were: NILM w/ HRHPV negative. H/O abnormal pap: yes once with Dr. Tresa Res with normal follow up and normal pap smears afterwards Last mammogram: 05/07/2022. Results were: normal. Family h/o breast cancer: no Last colonoscopy: 11/07/2020. Results were: normal. Family h/o colorectal cancer: no DEXA:  2022, normal     04/05/2022    9:52 AM 03/30/2021   10:31 AM 04/07/2020    8:53 AM  Depression screen PHQ 2/9  Decreased Interest 0 0 1  Down, Depressed, Hopeless 0 0 1  PHQ - 2 Score 0 0 2  Altered sleeping   3  Tired, decreased energy   3  Change in appetite   3  Feeling bad or failure about yourself    0  Trouble concentrating   3  Moving slowly or fidgety/restless   3  Suicidal thoughts   0  PHQ-9 Score   17  Difficult doing work/chores   Not difficult at all      Review of Systems:   Pertinent items are noted in HPI Denies any headaches, blurred vision, fatigue, shortness of breath, chest pain, abdominal pain, abnormal vaginal discharge/itching/odor/irritation, problems with periods, bowel movements, urination, or intercourse unless otherwise stated above. Pertinent History Reviewed:  Reviewed past medical,surgical, social and family history.  Reviewed problem list, medications and allergies. Physical Assessment:   Vitals:   05/07/23 1605  Weight: (!) 324 lb (147  kg)  Height: 5' 8.5" (1.74 m)  Body mass index is 48.55 kg/m.        Physical Examination:   General appearance - well appearing, and in no distress  Mental status - alert, oriented to person, place, and time  Psych:  She has a normal mood and affect  Skin - warm and dry, normal color, no suspicious lesions noted  Chest - effort normal, all lung fields clear to auscultation bilaterally  Heart - normal rate and regular rhythm  Neck:  midline trachea, no thyromegaly or nodules  Breasts - breasts appear normal, no suspicious masses, no skin or nipple changes or  axillary nodes  Abdomen - soft, nontender, nondistended, no masses or organomegaly  Pelvic - VULVA: normal appearing vulva with no masses, tenderness or lesions    VAGINA: normal appearing vagina with normal color and discharge, no lesions    CERVIX: normal appearing cervix without discharge or lesions, no CMT  Thin prep pap is not indicated today  UTERUS: uterus is felt to be normal size, shape, consistency and nontender   ADNEXA: No adnexal masses or tenderness noted.  Rectal - normal rectal, good sphincter tone, no masses felt.  Extremities:  No swelling or varicosities noted  Chaperone present for exam  No results found for this or any previous visit (from the past 24 hours).  Assessment & Plan:  1. Well woman exam with routine gynecological exam (  Primary) - Pap smear neg with neg HR HPV 2023 - Mammogram scheduled for tomorrow - Colonoscopy 11/2020 - Bone mineral density 2022 - lab work done with PCP, Jorge Ny - vaccines reviewed/updated  2. Malignant neoplasm of lower-outer quadrant of left breast of female, estrogen receptor positive (HCC) - off adjuvant therapy - followed by Dr. Mosetta Putt  3. Lichen sclerosus et atrophicus - skin is normal in appearance today - RF for mometasone to pharmacy.  Pt using very rarely right now.  4. Candidal skin infection - does not need RF for topical anti-fungal  5.  Postmenopausal  6. Mixed stress and urge urinary incontinence - Ambulatory referral to Urogynecology - Ambulatory referral to Physical Therapy   Meds: No orders of the defined types were placed in this encounter.   Follow-up: No follow-ups on file.  Hendricks Milo, CMA 05/07/2023 4:06 PM

## 2023-05-08 ENCOUNTER — Ambulatory Visit
Admission: RE | Admit: 2023-05-08 | Discharge: 2023-05-08 | Disposition: A | Discharge status: Home / Self Care | Payer: BC Managed Care – PPO | Source: Ambulatory Visit | Attending: Nurse Practitioner | Admitting: Nurse Practitioner

## 2023-05-08 DIAGNOSIS — C50512 Malignant neoplasm of lower-outer quadrant of left female breast: Secondary | ICD-10-CM

## 2023-06-04 ENCOUNTER — Other Ambulatory Visit (HOSPITAL_BASED_OUTPATIENT_CLINIC_OR_DEPARTMENT_OTHER): Payer: Self-pay | Admitting: Family Medicine

## 2023-06-04 DIAGNOSIS — E785 Hyperlipidemia, unspecified: Secondary | ICD-10-CM

## 2023-07-19 ENCOUNTER — Ambulatory Visit (HOSPITAL_BASED_OUTPATIENT_CLINIC_OR_DEPARTMENT_OTHER)
Admission: RE | Admit: 2023-07-19 | Discharge: 2023-07-19 | Disposition: A | Discharge status: Home / Self Care | Payer: Self-pay | Source: Ambulatory Visit | Attending: Family Medicine | Admitting: Family Medicine

## 2023-07-19 DIAGNOSIS — E785 Hyperlipidemia, unspecified: Secondary | ICD-10-CM | POA: Insufficient documentation

## 2023-07-23 ENCOUNTER — Other Ambulatory Visit: Payer: Self-pay

## 2023-07-23 ENCOUNTER — Ambulatory Visit: Attending: Obstetrics & Gynecology | Admitting: Physical Therapy

## 2023-07-23 DIAGNOSIS — M25551 Pain in right hip: Secondary | ICD-10-CM | POA: Diagnosis present

## 2023-07-23 DIAGNOSIS — R293 Abnormal posture: Secondary | ICD-10-CM | POA: Diagnosis present

## 2023-07-23 DIAGNOSIS — M25561 Pain in right knee: Secondary | ICD-10-CM | POA: Insufficient documentation

## 2023-07-23 DIAGNOSIS — N3946 Mixed incontinence: Secondary | ICD-10-CM | POA: Insufficient documentation

## 2023-07-23 DIAGNOSIS — G8929 Other chronic pain: Secondary | ICD-10-CM | POA: Insufficient documentation

## 2023-07-23 DIAGNOSIS — M6281 Muscle weakness (generalized): Secondary | ICD-10-CM | POA: Diagnosis present

## 2023-07-23 DIAGNOSIS — R2689 Other abnormalities of gait and mobility: Secondary | ICD-10-CM | POA: Insufficient documentation

## 2023-07-23 NOTE — Therapy (Signed)
 OUTPATIENT PHYSICAL THERAPY FEMALE PELVIC EVALUATION   Patient Name: Anna Martinez MRN: [id number] DOB:[birth year], 63 y.o., female Today's Date: 07/23/2023  END OF SESSION:  PT End of Session - 07/23/23 0828     Visit Number 1    Date for PT Re-Evaluation 01/22/24    Authorization Type [employer] [id number]  no auth req    PT Start Time 0802    PT Stop Time 0908    PT Time Calculation (min) 66 min    Activity Tolerance Patient tolerated treatment well    Behavior During Therapy [facility] for tasks assessed/performed          Past Medical History:  Diagnosis Date   Abnormal Pap smear 08/13/2012   LSIL may include HPV or mild Dysplasia (CIN1)   Acute lateral meniscus tear of right knee, subsequent encounter    Breast cancer (HCC) 05/2020   LEFT   Depression    Family history of breast cancer 05/30/2020   Family history of pancreatic cancer 05/30/2020   Family history of prostate cancer 05/30/2020   Family history of renal cancer 05/30/2020   Family history of skin cancer 05/30/2020   Gallbladder problem    Heart murmur    cardiologist a long time ago ECHO not completed   Hypertension    on meds   Knee pain    Lateral meniscus tear    right knee   Obesity    Other fatigue    Personal history of radiation therapy    Pre-diabetes    Shortness of breath on exertion    Sleep apnea    uses CPAP nightly   Vitamin D  deficiency    Past Surgical History:  Procedure Laterality Date   BREAST LUMPECTOMY Left 06/29/2020   BREAST LUMPECTOMY WITH RADIOACTIVE SEED AND SENTINEL LYMPH NODE BIOPSY Left 06/29/2020   Procedure: LEFT BREAST LUMPECTOMY WITH RADIOACTIVE SEED;  Surgeon: Caralyn Chandler, MD;  Location: [facility] OR;  Service: General;  Laterality: Left;   CYST REMOVAL TRUNK Left 06/29/2020   Procedure: REMOVAL OF SEBACEOUS CYST;  Surgeon: Lillian Rein, MD;  Location: [facility] OR;  Service: Gynecology;  Laterality: Left;   HYSTEROSCOPY WITH D & C N/A 06/29/2020   Procedure: DILATATION  AND CURETTAGE /HYSTEROSCOPY WITH MYOSURE;  Surgeon: Lillian Rein, MD;  Location: [facility] OR;  Service: Gynecology;  Laterality: N/A;   KNEE ARTHROSCOPY WITH LATERAL MENISECTOMY Right 10/03/2020   Procedure: KNEE ARTHROSCOPY WITH LATERAL MENISECTOMY;  Surgeon: Elly Habermann, MD;  Location: [facility];  Service: Orthopedics;  Laterality: Right;   LAPAROSCOPIC CHOLECYSTECTOMY  2001   SENTINEL NODE BIOPSY Left 06/29/2020   Procedure: LEFT SENTINEL LYMPH NODE BIOPSY;  Surgeon: Caralyn Chandler, MD;  Location: [facility] OR;  Service: General;  Laterality: Left;   Patient Active Problem List   Diagnosis Date Noted   Acute lateral meniscus tear of right knee, subsequent encounter 09/29/2020   Postmenopausal bleeding    Endometrial polyp    Vulvar cyst    Diabetes mellitus (HCC) 06/22/2020   Genetic testing 06/06/2020   Family history of breast cancer 05/30/2020   Family history of prostate cancer 05/30/2020   Family history of skin cancer 05/30/2020   Family history of pancreatic cancer 05/30/2020   Family history of renal cancer 05/30/2020   Malignant neoplasm of lower-outer quadrant of left breast of female, estrogen receptor positive (HCC) 05/18/2020   Hyperlipidemia 03/24/2020   Mixed hyperlipidemia 03/24/2020   Moderate recurrent major depression (HCC) 03/24/2020  Snoring 03/24/2020   Vitamin D  deficiency 03/24/2020   Lichen sclerosus et atrophicus 05/11/2019   Sleep apnea    Essential hypertension, benign 11/29/2015   Class 3 severe obesity with serious comorbidity and body mass index (BMI) of 50.0 to 59.9 in adult 08/21/2014    PCP: Bufford Carne, FNP  REFERRING PROVIDER: Lillian Rein, MD   REFERRING DIAG: [id number] (ICD-10-CM) - Mixed stress and urge urinary incontinence   THERAPY DIAG:  Other abnormalities of gait and mobility  Muscle weakness (generalized)  Chronic pain of right knee  Pain in right hip  Abnormal posture  Rationale for Evaluation and Treatment:  Rehabilitation  ONSET DATE: pt reports 10 years ago and has got worse recently  SUBJECTIVE:                                                                                                                                                                                           SUBJECTIVE STATEMENT: Pt reports that she can't go anywhere  without a pad due to stress and the urge.  Fluid intake: mostly water, coffee every day and un sweet tea and milk  PAIN:  Are you having pain? Yes leg and knee - sometimes difficult to walk, has hip joint pain, sometimes hard to make it to the bathroom, especially in the middle of the night NPRS scale: 5/10 Pain location: no pelvic pain  Pain type: aching Pain description: intermittent   Aggravating factors: lying on right side aggravates her hip, STS hurts her knees and legs, has toe pain as well Relieving factors: ibuprofen  PRECAUTIONS: None  RED FLAGS: None   WEIGHT BEARING RESTRICTIONS: No  FALLS:  Has patient fallen in last 6 months? No  OCCUPATION: retired [employer], does pottery  ACTIVITY LEVEL : more sedentary now, sometimes gets active  PLOF: Independent  PATIENT GOALS: to stop wearing pads every day. To get rid of incontinence during sex  PERTINENT HISTORY:  Breast cancer - was on estrogen blocker Sexual abuse: No  BOWEL MOVEMENT: no issues  URINATION: Pain with urination: No Fully empty bladder: Yes: if she rocks aftre she finishes peeing Stream: Strong Urgency: No Frequency: yes- on a diuretic Leakage: Urge to void, Walking to the bathroom, Coughing, Sneezing, Laughing, Intercourse, and has triggers- seeing  atoilet Pads: Yes: 3-4  INTERCOURSE:  Ability to have vaginal penetration Yes  Pain with intercourse: none DrynessYes - uses vaseline Climax: yes Marinoff Scale: 0/3 Laxative:no  PREGNANCY: Vaginal deliveries 1 Tearing Yes:   Episiotomy Yes  C-section deliveries 0 Currently pregnant  No  PROLAPSE: Pressure- possibly   OBJECTIVE:  Note: Objective  measures were completed at Evaluation unless otherwise noted.  DIAGNOSTIC FINDINGS:  None recently  PATIENT SURVEYS:    PFIQ-7: 5  COGNITION: Overall cognitive status: Within functional limits for tasks assessed     SENSATION: Light touch: Appears intact  LUMBAR SPECIAL TESTS:  Straight leg raise test: Negative for pain  Single leg squat- difficult, has to hold on [employer]- has to hold on counter  GAIT: Assistive device utilized: None Comments: antalgic, appears difficult, wide stance, slow  POSTURE: rounded shoulders, forward head, increased lumbar lordosis, and anterior pelvic tilt   LUMBARAROM/PROM:  A/PROM A/PROM  Eval % of availability  Flexion 100  Extension 100  Right lateral flexion 75  Left lateral flexion 75  Right rotation 100  Left rotation 100   (Blank rows = not tested)  LOWER EXTREMITY ROM:  Passive ROM Right eval Left eval  Hip flexion    Hip extension    Hip abduction    Hip adduction    Hip internal rotation 25 D 5 D  Hip external rotation    Knee flexion    Knee extension    Ankle dorsiflexion    Ankle plantarflexion    Ankle inversion    Ankle eversion     (Blank rows = not tested)  LOWER EXTREMITY MMT:  MMT Right eval Left eval  Hip flexion 4+/5 4/5  Hip extension    Hip abduction    Hip adduction    Hip internal rotation    Hip external rotation    Knee flexion    Knee extension 4+/5 4+/5  Ankle dorsiflexion    Ankle plantarflexion    Ankle inversion    Ankle eversion     (Blank rows = not tested) PALPATION:   General: upper chest breathing strategies  Pelvic Alignment: even  Abdominal: diastasis present                External Perineal Exam: dryness present, signs of  decreased estrogen                              Internal Pelvic Floor: able to contract relax, bulge with good coordination  Patient confirms identification and  approves PT to assess internal pelvic floor and treatment Yes Patient confirms identification and approves PT to assess internal pelvic floor and treatment Yes No emotional/communication barriers or cognitive limitation. Patient is motivated to learn. Patient understands and agrees with treatment goals and plan. PT explains patient will be examined in standing, sitting, and lying down to see how their muscles and joints work. When they are ready, they will be asked to remove their underwear so PT can examine their perineum. The patient is also given the option of providing their own chaperone as one is not provided in our facility. The patient also has the right and is explained the right to defer or refuse any part of the evaluation or treatment including the internal exam. With the patient's consent, PT will use one gloved finger to gently assess the muscles of the pelvic floor, seeing how well it contracts and relaxes and if there is muscle symmetry. After, the patient will get dressed and PT and patient will discuss exam findings and plan of care. PT and patient discuss plan of care, schedule, attendance policy and HEP activities.     PELVIC MMT:   MMT eval  Vaginal 3/5, 3 s hold, 8 reps  Internal Anal Sphincter  External Anal Sphincter   Puborectalis   Diastasis Recti 2 fingers at umbilicus and above umbilicus with doming  (Blank rows = not tested)        TONE:            average  PROLAPSE: Mild anterior wall laxity present  TODAY'S TREATMENT:                                                                                                                              DATE: 07/23/2023  EVAL EVAL Examination completed, findings reviewed, pt educated on POC, HEP, and female pelvic floor anatomy, reasoning with pelvic floor assessment internally with pt consent. Pt motivated to participate in PT and agreeable to attempt recommendations.    Neuro reed-  with transverse abdominis breath  ball  press bilateral hands together                      Ball press into knee bilateral  Cough with pelvic floor lift  PATIENT EDUCATION:  Education details: Pt was educated on relevant anatomy, exam findings, HEP, expectations of PT   Person educated: Patient Education method: Explanation, Demonstration, Tactile cues, Verbal cues, and Handouts Education comprehension: verbalized understanding, returned demonstration, verbal cues required, tactile cues required, and needs further education  HOME EXERCISE PROGRAM: Access Code: [email or web address] URL: [email or web address] Date: 07/23/2023 Prepared by: Jameson Mcburney Connie Lasater  Patient Education - Get To Know Your Pelvic Floor- Female - Urinary Urge Control Techniques - Bladder Retraining Strategies - Urinary Incontinence - Urinary Urge Control Techniques - Bladder Retraining  ASSESSMENT:  CLINICAL IMPRESSION: Patient is a 63 y.o. F who was seen today for physical therapy evaluation and treatment for mixed urinary incontinence.. She reports that she broke a toe on her left foot, has been painful, has had right knee pain, had a meniscus repair in the past, her right hip and been painful as  well. With internal assessment findings notable tor anterior vaginal wall laxity, pelvic floor weakness in superficial layers but she has good coordination. She presents with tissue dryness with lack of estrogen and she uses vaseline. Gave her samples of good clean love vaginal moisturizers and vulva balm.  Pt has difficulty with walking, knee and hip pain and will benefit from aqua therapy as well as possible ortho PT to reduce pain as decreased mobility makes it hard for her to make it to the bathroom on time. Her goals are not to wear pads and not to leak with intercourse/   OBJECTIVE IMPAIRMENTS: Abnormal gait, decreased activity tolerance, decreased balance, decreased endurance, decreased mobility, difficulty walking, decreased ROM, decreased strength,  hypomobility, increased fascial restrictions, increased muscle spasms, obesity, and pain.   ACTIVITY LIMITATIONS: standing, squatting, transfers, continence, toileting, and locomotion level  PARTICIPATION LIMITATIONS: community activity  PERSONAL FACTORS: Time since onset of injury/illness/exacerbation are also affecting patient's functional outcome.   REHAB POTENTIAL: Good  CLINICAL DECISION MAKING: Evolving/moderate complexity  EVALUATION COMPLEXITY:  Moderate   GOALS: Goals reviewed with patient? Yes  SHORT TERM GOALS: Target date: 08/20/2023    Pt will be I and consistent with her HEP Baseline: Goal status: INITIAL  2.  Pt will be educated on vaginal moisturizers to improve vaginal tissue health and improve range of motion of the tissue for continence.  Baseline:  Goal status: INITIAL  3.  Pt will be educated on urge suppression techniques Baseline:  Goal status: INITIAL  4.  Pt will fill out a bladder diary to identify habits and triggers Baseline:  Goal status: INITIAL   LONG TERM GOALS: Target date: 01/22/2024  Pt will soak 0 pads/ day in order to run errands and not have to interrupt daily tasks.  Baseline:  Goal status: INITIAL  2.  Pt will be consistent with bladder retraining to reduce urinary urgency and frequency and reduce the need to go to bathroom more then every 2 hrs Baseline:  Goal status: INITIAL  3.  Pt will be able to have intercourse without leaking urine Baseline:  Goal status: INITIAL  4.  Pt will be able to walk to the bathroom without leaking 100% of time Baseline:  Goal status: INITIAL  5.  Pt will report reduced knee pain to max 2/10 with walking at least 30 mins Baseline:  Goal status: INITIAL  6.  Pt will report max 2/10 back and hip pain with walking at least 30 mins Baseline:  Goal status: INITIAL  PLAN:  PT FREQUENCY: 1-2x/week  PT DURATION: 6 months  PLANNED INTERVENTIONS: 97110-Therapeutic exercises, 97530-  Therapeutic activity, 97112- Neuromuscular re-education, 97535- Self Care, 29528- Manual therapy, [phone]- Aquatic Therapy, [phone]- Electrical stimulation (manual), [phone] (1-2 muscles), 20561 (3+ muscles)- Dry Needling, Patient/Family education, Balance training, Taping, Joint mobilization, Joint manipulation, Spinal manipulation, Spinal mobilization, Scar mobilization, Cryotherapy, Moist heat, and Biofeedback  PLAN FOR NEXT SESSION: urge suppression techniques, trigger education, core strengthening, hip stretches   Prisha Hiley, PT 07/23/2023, 9:15 AM

## 2023-07-25 ENCOUNTER — Ambulatory Visit (HOSPITAL_BASED_OUTPATIENT_CLINIC_OR_DEPARTMENT_OTHER): Attending: Obstetrics & Gynecology | Admitting: Physical Therapy

## 2023-07-25 ENCOUNTER — Encounter (HOSPITAL_BASED_OUTPATIENT_CLINIC_OR_DEPARTMENT_OTHER): Payer: Self-pay | Admitting: Physical Therapy

## 2023-07-25 DIAGNOSIS — M25561 Pain in right knee: Secondary | ICD-10-CM | POA: Insufficient documentation

## 2023-07-25 DIAGNOSIS — G8929 Other chronic pain: Secondary | ICD-10-CM | POA: Insufficient documentation

## 2023-07-25 DIAGNOSIS — M25551 Pain in right hip: Secondary | ICD-10-CM | POA: Diagnosis present

## 2023-07-25 DIAGNOSIS — R2689 Other abnormalities of gait and mobility: Secondary | ICD-10-CM | POA: Insufficient documentation

## 2023-07-25 DIAGNOSIS — M6281 Muscle weakness (generalized): Secondary | ICD-10-CM | POA: Insufficient documentation

## 2023-07-25 NOTE — Therapy (Signed)
 OUTPATIENT PHYSICAL THERAPY TREATMENT   Patient Name: Anna Martinez MRN: [id number] DOB:Apr 29, 1960, 63 y.o., female Today's Date: 07/25/2023  END OF SESSION:  PT End of Session - 07/25/23 1441     Visit Number 2    Date for PT Re-Evaluation 01/22/24    Authorization Type [employer]na [employer] [id number]  no auth req    PT Start Time 1438    PT Stop Time 1520    PT Time Calculation (min) 42 min    Behavior During Therapy [facility] for tasks assessed/performed          Past Medical History:  Diagnosis Date   Abnormal Pap smear 08/13/2012   LSIL may include HPV or mild Dysplasia (CIN1)   Acute lateral meniscus tear of right knee, subsequent encounter    Breast cancer (HCC) 05/2020   LEFT   Depression    Family history of breast cancer 05/30/2020   Family history of pancreatic cancer 05/30/2020   Family history of prostate cancer 05/30/2020   Family history of renal cancer 05/30/2020   Family history of skin cancer 05/30/2020   Gallbladder problem    Heart murmur    cardiologist a long time ago ECHO not completed   Hypertension    on meds   Knee pain    Lateral meniscus tear    right knee   Obesity    Other fatigue    Personal history of radiation therapy    Pre-diabetes    Shortness of breath on exertion    Sleep apnea    uses CPAP nightly   Vitamin D  deficiency    Past Surgical History:  Procedure Laterality Date   BREAST LUMPECTOMY Left 06/29/2020   BREAST LUMPECTOMY WITH RADIOACTIVE SEED AND SENTINEL LYMPH NODE BIOPSY Left 06/29/2020   Procedure: LEFT BREAST LUMPECTOMY WITH RADIOACTIVE SEED;  Surgeon: Caralyn Chandler, MD;  Location: [facility] OR;  Service: General;  Laterality: Left;   CYST REMOVAL TRUNK Left 06/29/2020   Procedure: REMOVAL OF SEBACEOUS CYST;  Surgeon: Lillian Rein, MD;  Location: [facility] OR;  Service: Gynecology;  Laterality: Left;   HYSTEROSCOPY WITH D & C N/A 06/29/2020   Procedure: DILATATION AND CURETTAGE /HYSTEROSCOPY WITH MYOSURE;  Surgeon: Lillian Rein, MD;   Location: [facility] OR;  Service: Gynecology;  Laterality: N/A;   KNEE ARTHROSCOPY WITH LATERAL MENISECTOMY Right 10/03/2020   Procedure: KNEE ARTHROSCOPY WITH LATERAL MENISECTOMY;  Surgeon: Elly Habermann, MD;  Location: [facility];  Service: Orthopedics;  Laterality: Right;   LAPAROSCOPIC CHOLECYSTECTOMY  2001   SENTINEL NODE BIOPSY Left 06/29/2020   Procedure: LEFT SENTINEL LYMPH NODE BIOPSY;  Surgeon: Caralyn Chandler, MD;  Location: [facility] OR;  Service: General;  Laterality: Left;   Patient Active Problem List   Diagnosis Date Noted   Acute lateral meniscus tear of right knee, subsequent encounter 09/29/2020   Postmenopausal bleeding    Endometrial polyp    Vulvar cyst    Diabetes mellitus (HCC) 06/22/2020   Genetic testing 06/06/2020   Family history of breast cancer 05/30/2020   Family history of prostate cancer 05/30/2020   Family history of skin cancer 05/30/2020   Family history of pancreatic cancer 05/30/2020   Family history of renal cancer 05/30/2020   Malignant neoplasm of lower-outer quadrant of left breast of female, estrogen receptor positive (HCC) 05/18/2020   Hyperlipidemia 03/24/2020   Mixed hyperlipidemia 03/24/2020   Moderate recurrent major depression (HCC) 03/24/2020   Snoring 03/24/2020   Vitamin D  deficiency 03/24/2020  Lichen sclerosus et atrophicus 05/11/2019   Sleep apnea    Essential hypertension, benign 11/29/2015   Class 3 severe obesity with serious comorbidity and body mass index (BMI) of 50.0 to 59.9 in adult 08/21/2014    PCP: Bufford Carne, FNP  REFERRING PROVIDER: Lillian Rein, MD   REFERRING DIAG: [phone] (ICD-10-CM) - Mixed stress and urge urinary incontinence   THERAPY DIAG:  Other abnormalities of gait and mobility  Muscle weakness (generalized)  Chronic pain of right knee  Pain in right hip  Rationale for Evaluation and Treatment: Rehabilitation  ONSET DATE: pt reports 10 years ago and has got worse  recently  SUBJECTIVE:                                                                                                                                                                                           SUBJECTIVE STATEMENT: Pt reports no changes since initial evaluation.  She has been working on breathing/pelvic floor exercises; going well.  She reports she has emptied bladder and she should have no problem in pool.  She loves the water and knows how to swim; would like to learn exercises for knees/hips she can do at aquatic center.    POOL ACCESS: can go to aquatic center  PAIN:  Are you having pain? Yes bilat knees, R hip, R ankle NPRS scale: 3/10 Pain location: no pelvic pain  Pain type: aching Pain description: intermittent   Aggravating factors: lying on right side aggravates her hip, STS hurts her knees and legs, has toe pain as well Relieving factors: ibuprofen  PRECAUTIONS: None  RED FLAGS: None   WEIGHT BEARING RESTRICTIONS: No  FALLS:  Has patient fallen in last 6 months? No  OCCUPATION: retired [employer], does pottery  ACTIVITY LEVEL : more sedentary now, sometimes gets active  PLOF: Independent  PATIENT GOALS: to stop wearing pads every day. To get rid of incontinence during sex  PERTINENT HISTORY:  Breast cancer - was on estrogen blocker Sexual abuse: No  BOWEL MOVEMENT: no issues  URINATION: Pain with urination: No Fully empty bladder: Yes: if she rocks aftre she finishes peeing Stream: Strong Urgency: No Frequency: yes- on a diuretic Leakage: Urge to void, Walking to the bathroom, Coughing, Sneezing, Laughing, Intercourse, and has triggers- seeing  atoilet Pads: Yes: 3-4  INTERCOURSE:  Ability to have vaginal penetration Yes  Pain with intercourse: none DrynessYes - uses vaseline Climax: yes Marinoff Scale: 0/3 Laxative:no  PREGNANCY: Vaginal deliveries 1 Tearing Yes:   Episiotomy Yes  C-section deliveries 0 Currently pregnant  No  PROLAPSE: Pressure- possibly   OBJECTIVE:  Note:  Objective measures were completed at Evaluation unless otherwise noted.  DIAGNOSTIC FINDINGS:  None recently  PATIENT SURVEYS:    PFIQ-7: 5  COGNITION: Overall cognitive status: Within functional limits for tasks assessed     SENSATION: Light touch: Appears intact  LUMBAR SPECIAL TESTS:  Straight leg raise test: Negative for pain  Single leg squat- difficult, has to hold on [employer]- has to hold on counter  GAIT: Assistive device utilized: None Comments: antalgic, appears difficult, wide stance, slow  POSTURE: rounded shoulders, forward head, increased lumbar lordosis, and anterior pelvic tilt   LUMBARAROM/PROM:  A/PROM A/PROM  Eval % of availability  Flexion 100  Extension 100  Right lateral flexion 75  Left lateral flexion 75  Right rotation 100  Left rotation 100   (Blank rows = not tested)  LOWER EXTREMITY ROM:  Passive ROM Right eval Left eval  Hip flexion    Hip extension    Hip abduction    Hip adduction    Hip internal rotation 25 D 5 D  Hip external rotation    Knee flexion    Knee extension    Ankle dorsiflexion    Ankle plantarflexion    Ankle inversion    Ankle eversion     (Blank rows = not tested)  LOWER EXTREMITY MMT:  MMT Right eval Left eval  Hip flexion 4+/5 4/5  Hip extension    Hip abduction    Hip adduction    Hip internal rotation    Hip external rotation    Knee flexion    Knee extension 4+/5 4+/5  Ankle dorsiflexion    Ankle plantarflexion    Ankle inversion    Ankle eversion     (Blank rows = not tested) PALPATION:   General: upper chest breathing strategies  Pelvic Alignment: even  Abdominal: diastasis present                External Perineal Exam: dryness present, signs of  decreased estrogen                              Internal Pelvic Floor: able to contract relax, bulge with good coordination  Patient confirms identification and  approves PT to assess internal pelvic floor and treatment Yes Patient confirms identification and approves PT to assess internal pelvic floor and treatment Yes No emotional/communication barriers or cognitive limitation. Patient is motivated to learn. Patient understands and agrees with treatment goals and plan. PT explains patient will be examined in standing, sitting, and lying down to see how their muscles and joints work. When they are ready, they will be asked to remove their underwear so PT can examine their perineum. The patient is also given the option of providing their own chaperone as one is not provided in our facility. The patient also has the right and is explained the right to defer or refuse any part of the evaluation or treatment including the internal exam. With the patient's consent, PT will use one gloved finger to gently assess the muscles of the pelvic floor, seeing how well it contracts and relaxes and if there is muscle symmetry. After, the patient will get dressed and PT and patient will discuss exam findings and plan of care. PT and patient discuss plan of care, schedule, attendance policy and HEP activities.     PELVIC MMT:   MMT eval  Vaginal 3/5, 3 s hold, 8 reps  Internal Anal Sphincter  External Anal Sphincter   Puborectalis   Diastasis Recti 2 fingers at umbilicus and above umbilicus with doming  (Blank rows = not tested)        TONE:            average  PROLAPSE: Mild anterior wall laxity present  TODAY'S TREATMENT:                                                                                                                              [facility] Adult PT Treatment:                                            07/25/23 Pt seen for aquatic therapy today.  Treatment took place in water 3.5-4.75 ft in depth at the [facility] pool. Temp of water was 91.  Pt entered/exited the pool via stairs independently in step-to pattern with bilat rail.  - Intro to  aquatic therapy principles - unsupported - multiple laps walking forward/ backward - unsupported side stepping -> with arm addct/ abdct with yellow hand floats-> into wide squat (not tolerated) - suitcase carry with bilat yellow -> single yellow at side, -> bilat blue -> single blue at side, marching forward/ backward - UE on wall:  toe/heel raises x12; hip add/abd x 12 ; hip flexion /extension x 10; relaxed squats x10 -seated on bench in water:  cycling LEs, LAQ with DF, hip abdct/ addct   - straddling noodle with no UE support (extra noodle underneath UE): cycling, cross country ski, suspended jumping jack   Pt requires the buoyancy and hydrostatic pressure of water for support, and to offload joints by unweighting joint load by at least 50 % in navel deep water and by at least 75-80% in chest to neck deep water.  Viscosity of the water is needed for resistance of strengthening. Water current perturbations provides challenge to standing balance requiring increased core activation.     PATIENT EDUCATION:  Education details: intro to aquatic therapy   Person educated: Patient Education method: Explanation, Demonstration, Tactile cues, Verbal cues Education comprehension: verbalized understanding, returned demonstration, verbal cues required, tactile cues required, and needs further education  HOME EXERCISE PROGRAM: Access Code: [email or web address] URL: [email or web address] Date: 07/23/2023 Prepared by: Jameson Mcburney Helmus  Patient Education - Get To Know Your Pelvic Floor- Female - Urinary Urge Control Techniques - Bladder Retraining Strategies - Urinary Incontinence - Urinary Urge Control Techniques - Bladder Retraining  ASSESSMENT:  CLINICAL IMPRESSION: Pt demonstrates safety and independence in aquatic setting with therapist instructing from deck. Pt demonstrates confidence in setting, moving throughout all depths easily.  Pt is directed through various movement patterns and trials  in both sitting and standing positions. She only reported increase in knee pain with trial of side stepping into wide squat. All other exercises were tolerated well. Will plan to  create HEP in future session, so  that she can continue on at [facility] independently. Plan to show 3 way LE stretch and STS at stairs next visit.  Goals are ongoing.        From initial evaluation:  Patient is a 63 y.o. F who was seen today for physical therapy evaluation and treatment for mixed urinary incontinence.. She reports that she broke a toe on her left foot, has been painful, has had right knee pain, had a meniscus repair in the past, her right hip and been painful as  well. With internal assessment findings notable tor anterior vaginal wall laxity, pelvic floor weakness in superficial layers but she has good coordination. She presents with tissue dryness with lack of estrogen and she uses vaseline. Gave her samples of good clean love vaginal moisturizers and vulva balm.  Pt has difficulty with walking, knee and hip pain and will benefit from aqua therapy as well as possible ortho PT to reduce pain as decreased mobility makes it hard for her to make it to the bathroom on time. Her goals are not to wear pads and not to leak with intercourse/   OBJECTIVE IMPAIRMENTS: Abnormal gait, decreased activity tolerance, decreased balance, decreased endurance, decreased mobility, difficulty walking, decreased ROM, decreased strength, hypomobility, increased fascial restrictions, increased muscle spasms, obesity, and pain.   ACTIVITY LIMITATIONS: standing, squatting, transfers, continence, toileting, and locomotion level  PARTICIPATION LIMITATIONS: community activity  PERSONAL FACTORS: Time since onset of injury/illness/exacerbation are also affecting patient's functional outcome.   REHAB POTENTIAL: Good  CLINICAL DECISION MAKING: Evolving/moderate complexity  EVALUATION COMPLEXITY: Moderate   GOALS: Goals reviewed with  patient? Yes  SHORT TERM GOALS: Target date: 08/20/2023    Pt will be I and consistent with her HEP Baseline: Goal status: INITIAL  2.  Pt will be educated on vaginal moisturizers to improve vaginal tissue health and improve range of motion of the tissue for continence.  Baseline:  Goal status: INITIAL  3.  Pt will be educated on urge suppression techniques Baseline:  Goal status: INITIAL  4.  Pt will fill out a bladder diary to identify habits and triggers Baseline:  Goal status: INITIAL   LONG TERM GOALS: Target date: 01/22/2024  Pt will soak 0 pads/ day in order to run errands and not have to interrupt daily tasks.  Baseline:  Goal status: INITIAL  2.  Pt will be consistent with bladder retraining to reduce urinary urgency and frequency and reduce the need to go to bathroom more then every 2 hrs Baseline:  Goal status: INITIAL  3.  Pt will be able to have intercourse without leaking urine Baseline:  Goal status: INITIAL  4.  Pt will be able to walk to the bathroom without leaking 100% of time Baseline:  Goal status: INITIAL  5.  Pt will report reduced knee pain to max 2/10 with walking at least 30 mins Baseline:  Goal status: INITIAL  6.  Pt will report max 2/10 back and hip pain with walking at least 30 mins Baseline:  Goal status: INITIAL  PLAN:  PT FREQUENCY: 1-2x/week  PT DURATION: 6 months  PLANNED INTERVENTIONS: 97110-Therapeutic exercises, 97530- Therapeutic activity, 97112- Neuromuscular re-education, 97535- Self Care, [id number]- Manual therapy, [phone]- Aquatic Therapy, [phone]- Electrical stimulation (manual), [phone] (1-2 muscles), [phone] (3+ muscles)- Dry Needling, Patient/Family education, Balance training, Taping, Joint mobilization, Joint manipulation, Spinal manipulation, Spinal mobilization, Scar mobilization, Cryotherapy, Moist heat, and Biofeedback  PLAN FOR NEXT SESSION: urge suppression techniques, trigger education,  core strengthening, hip  stretches   Almedia Jacobsen, PTA 07/25/23 3:32 PM [facility] [address] [place], [place], [address] Phone: [phone]   Fax:  [phone]

## 2023-07-30 ENCOUNTER — Encounter (HOSPITAL_BASED_OUTPATIENT_CLINIC_OR_DEPARTMENT_OTHER): Payer: Self-pay | Admitting: Physical Therapy

## 2023-07-30 ENCOUNTER — Ambulatory Visit (HOSPITAL_BASED_OUTPATIENT_CLINIC_OR_DEPARTMENT_OTHER): Admitting: Physical Therapy

## 2023-07-30 DIAGNOSIS — M6281 Muscle weakness (generalized): Secondary | ICD-10-CM

## 2023-07-30 DIAGNOSIS — R2689 Other abnormalities of gait and mobility: Secondary | ICD-10-CM

## 2023-07-30 DIAGNOSIS — G8929 Other chronic pain: Secondary | ICD-10-CM

## 2023-07-30 NOTE — Therapy (Signed)
 OUTPATIENT PHYSICAL THERAPY TREATMENT   Patient Name: Anna Martinez MRN: [id number] DOB:1960-12-02, 63 y.o., female Today's Date: 07/30/2023  END OF SESSION:  PT End of Session - 07/30/23 1507     Visit Number 3    Date for PT Re-Evaluation 01/22/24    Authorization Type [employer]na [employer] 2025  no auth req    PT Start Time 1405    PT Stop Time 1445    PT Time Calculation (min) 40 min    Activity Tolerance Patient tolerated treatment well    Behavior During Therapy [facility] for tasks assessed/performed           Past Medical History:  Diagnosis Date   Abnormal Pap smear 08/13/2012   LSIL may include HPV or mild Dysplasia (CIN1)   Acute lateral meniscus tear of right knee, subsequent encounter    Breast cancer (HCC) 05/2020   LEFT   Depression    Family history of breast cancer 05/30/2020   Family history of pancreatic cancer 05/30/2020   Family history of prostate cancer 05/30/2020   Family history of renal cancer 05/30/2020   Family history of skin cancer 05/30/2020   Gallbladder problem    Heart murmur    cardiologist a long time ago ECHO not completed   Hypertension    on meds   Knee pain    Lateral meniscus tear    right knee   Obesity    Other fatigue    Personal history of radiation therapy    Pre-diabetes    Shortness of breath on exertion    Sleep apnea    uses CPAP nightly   Vitamin D  deficiency    Past Surgical History:  Procedure Laterality Date   BREAST LUMPECTOMY Left 06/29/2020   BREAST LUMPECTOMY WITH RADIOACTIVE SEED AND SENTINEL LYMPH NODE BIOPSY Left 06/29/2020   Procedure: LEFT BREAST LUMPECTOMY WITH RADIOACTIVE SEED;  Surgeon: Curvin Deward MOULD, MD;  Location: [facility] OR;  Service: General;  Laterality: Left;   CYST REMOVAL TRUNK Left 06/29/2020   Procedure: REMOVAL OF SEBACEOUS CYST;  Surgeon: Cleotilde Ronal RAMAN, MD;  Location: [facility] OR;  Service: Gynecology;  Laterality: Left;   HYSTEROSCOPY WITH D & C N/A 06/29/2020   Procedure: DILATATION AND CURETTAGE  /HYSTEROSCOPY WITH MYOSURE;  Surgeon: Cleotilde Ronal RAMAN, MD;  Location: [facility] OR;  Service: Gynecology;  Laterality: N/A;   KNEE ARTHROSCOPY WITH LATERAL MENISECTOMY Right 10/03/2020   Procedure: KNEE ARTHROSCOPY WITH LATERAL MENISECTOMY;  Surgeon: Shandel Charleston, MD;  Location: [facility];  Service: Orthopedics;  Laterality: Right;   LAPAROSCOPIC CHOLECYSTECTOMY  2001   SENTINEL NODE BIOPSY Left 06/29/2020   Procedure: LEFT SENTINEL LYMPH NODE BIOPSY;  Surgeon: Curvin Deward MOULD, MD;  Location: [facility] OR;  Service: General;  Laterality: Left;   Patient Active Problem List   Diagnosis Date Noted   Acute lateral meniscus tear of right knee, subsequent encounter 09/29/2020   Postmenopausal bleeding    Endometrial polyp    Vulvar cyst    Diabetes mellitus (HCC) 06/22/2020   Genetic testing 06/06/2020   Family history of breast cancer 05/30/2020   Family history of prostate cancer 05/30/2020   Family history of skin cancer 05/30/2020   Family history of pancreatic cancer 05/30/2020   Family history of renal cancer 05/30/2020   Malignant neoplasm of lower-outer quadrant of left breast of female, estrogen receptor positive (HCC) 05/18/2020   Hyperlipidemia 03/24/2020   Mixed hyperlipidemia 03/24/2020   Moderate recurrent major depression (HCC) 03/24/2020  Snoring 03/24/2020   Vitamin D  deficiency 03/24/2020   Lichen sclerosus et atrophicus 05/11/2019   Sleep apnea    Essential hypertension, benign 11/29/2015   Class 3 severe obesity with serious comorbidity and body mass index (BMI) of 50.0 to 59.9 in adult 08/21/2014    PCP: Dyane Anthony RAMAN, FNP  REFERRING PROVIDER: Cleotilde Ronal RAMAN, MD   REFERRING DIAG: [phone] (ICD-10-CM) - Mixed stress and urge urinary incontinence   THERAPY DIAG:  Other abnormalities of gait and mobility  Muscle weakness (generalized)  Chronic pain of right knee  Rationale for Evaluation and Treatment: Rehabilitation  ONSET DATE: pt reports 10 years ago  and has got worse recently  SUBJECTIVE:                                                                                                                                                                                           SUBJECTIVE STATEMENT: Pt reports no changes since initial evaluation.  She has been working on breathing/pelvic floor exercises; going well.  She reports she has emptied bladder and she should have no problem in pool.  She loves the water and knows how to swim; would like to learn exercises for knees/hips she can do at aquatic center.    POOL ACCESS: can go to aquatic center  PAIN:  Are you having pain? Yes bilat knees, R hip, R ankle NPRS scale: 3/10 Pain location: no pelvic pain  Pain type: aching Pain description: intermittent   Aggravating factors: lying on right side aggravates her hip, STS hurts her knees and legs, has toe pain as well Relieving factors: ibuprofen  PRECAUTIONS: None  RED FLAGS: None   WEIGHT BEARING RESTRICTIONS: No  FALLS:  Has patient fallen in last 6 months? No  OCCUPATION: retired [employer], does pottery  ACTIVITY LEVEL : more sedentary now, sometimes gets active  PLOF: Independent  PATIENT GOALS: to stop wearing pads every day. To get rid of incontinence during sex  PERTINENT HISTORY:  Breast cancer - was on estrogen blocker Sexual abuse: No  BOWEL MOVEMENT: no issues  URINATION: Pain with urination: No Fully empty bladder: Yes: if she rocks aftre she finishes peeing Stream: Strong Urgency: No Frequency: yes- on a diuretic Leakage: Urge to void, Walking to the bathroom, Coughing, Sneezing, Laughing, Intercourse, and has triggers- seeing  atoilet Pads: Yes: 3-4  INTERCOURSE:  Ability to have vaginal penetration Yes  Pain with intercourse: none DrynessYes - uses vaseline Climax: yes Marinoff Scale: 0/3 Laxative:no  PREGNANCY: Vaginal deliveries 1 Tearing Yes:   Episiotomy Yes  C-section deliveries  0 Currently pregnant No  PROLAPSE: Pressure- possibly  OBJECTIVE:  Note: Objective measures were completed at Evaluation unless otherwise noted.  DIAGNOSTIC FINDINGS:  None recently  PATIENT SURVEYS:    PFIQ-7: 5  COGNITION: Overall cognitive status: Within functional limits for tasks assessed     SENSATION: Light touch: Appears intact  LUMBAR SPECIAL TESTS:  Straight leg raise test: Negative for pain  Single leg squat- difficult, has to hold on [employer]- has to hold on counter  GAIT: Assistive device utilized: None Comments: antalgic, appears difficult, wide stance, slow  POSTURE: rounded shoulders, forward head, increased lumbar lordosis, and anterior pelvic tilt   LUMBARAROM/PROM:  A/PROM A/PROM  Eval % of availability  Flexion 100  Extension 100  Right lateral flexion 75  Left lateral flexion 75  Right rotation 100  Left rotation 100   (Blank rows = not tested)  LOWER EXTREMITY ROM:  Passive ROM Right eval Left eval  Hip flexion    Hip extension    Hip abduction    Hip adduction    Hip internal rotation 25 D 5 D  Hip external rotation    Knee flexion    Knee extension    Ankle dorsiflexion    Ankle plantarflexion    Ankle inversion    Ankle eversion     (Blank rows = not tested)  LOWER EXTREMITY MMT:  MMT Right eval Left eval  Hip flexion 4+/5 4/5  Hip extension    Hip abduction    Hip adduction    Hip internal rotation    Hip external rotation    Knee flexion    Knee extension 4+/5 4+/5  Ankle dorsiflexion    Ankle plantarflexion    Ankle inversion    Ankle eversion     (Blank rows = not tested) PALPATION:   General: upper chest breathing strategies  Pelvic Alignment: even  Abdominal: diastasis present                External Perineal Exam: dryness present, signs of  decreased estrogen                              Internal Pelvic Floor: able to contract relax, bulge with good coordination  Patient confirms  identification and approves PT to assess internal pelvic floor and treatment Yes Patient confirms identification and approves PT to assess internal pelvic floor and treatment Yes No emotional/communication barriers or cognitive limitation. Patient is motivated to learn. Patient understands and agrees with treatment goals and plan. PT explains patient will be examined in standing, sitting, and lying down to see how their muscles and joints work. When they are ready, they will be asked to remove their underwear so PT can examine their perineum. The patient is also given the option of providing their own chaperone as one is not provided in our facility. The patient also has the right and is explained the right to defer or refuse any part of the evaluation or treatment including the internal exam. With the patient's consent, PT will use one gloved finger to gently assess the muscles of the pelvic floor, seeing how well it contracts and relaxes and if there is muscle symmetry. After, the patient will get dressed and PT and patient will discuss exam findings and plan of care. PT and patient discuss plan of care, schedule, attendance policy and HEP activities.     PELVIC MMT:   MMT eval  Vaginal 3/5, 3 s hold, 8 reps  Internal Anal Sphincter   External Anal Sphincter   Puborectalis   Diastasis Recti 2 fingers at umbilicus and above umbilicus with doming  (Blank rows = not tested)        TONE:            average  PROLAPSE: Mild anterior wall laxity present  TODAY'S TREATMENT:                                                                                                                              [facility] Adult PT Treatment:                                            07/30/23 Pt seen for aquatic therapy today.  Treatment took place in water 3.5-4.75 ft in depth at the [facility] pool. Temp of water was 91.  Pt entered/exited the pool via stairs independently in step-to pattern with bilat  rail.  - unsupported - multiple laps walking forward/ backward - unsupported side stepping -> with arm addct/ abdct with yellow hand floats. Cues for abd bracing - suitcase carry with bilat yellow bilaterally then unilateral  marching forward/ backward -Hamstring/gastroc stretch at steps - hip hinging: 2 x 5 -tandem stance 4.73ft ue support yellow HB holding R/L x 20 secs->ue add/abd x10 -SLS 4.0 ft ue support YHB x 20s-> ue add/abd x 10 - UE support yellow HB:  toe/heel raises x12 -solid noodle pull down x 10 wide stance then staggered stances.    Pt requires the buoyancy and hydrostatic pressure of water for support, and to offload joints by unweighting joint load by at least 50 % in navel deep water and by at least 75-80% in chest to neck deep water.  Viscosity of the water is needed for resistance of strengthening. Water current perturbations provides challenge to standing balance requiring increased core activation.    [facility] Adult PT Treatment:                                            07/25/23 Pt seen for aquatic therapy today.  Treatment took place in water 3.5-4.75 ft in depth at the [facility] pool. Temp of water was 91.  Pt entered/exited the pool via stairs independently in step-to pattern with bilat rail.  - Intro to aquatic therapy principles - unsupported - multiple laps walking forward/ backward - unsupported side stepping -> with arm addct/ abdct with yellow hand floats-> into wide squat (not tolerated) - suitcase carry with bilat yellow -> single yellow at side, -> bilat blue -> single blue at side, marching forward/ backward - UE on wall:  toe/heel raises x12; hip add/abd x 12 ; hip flexion /extension x 10; relaxed squats  x10 -seated on bench in water:  cycling LEs, LAQ with DF, hip abdct/ addct   - straddling noodle with no UE support (extra noodle underneath UE): cycling, cross country ski, suspended jumping jack   Pt requires the buoyancy and hydrostatic  pressure of water for support, and to offload joints by unweighting joint load by at least 50 % in navel deep water and by at least 75-80% in chest to neck deep water.  Viscosity of the water is needed for resistance of strengthening. Water current perturbations provides challenge to standing balance requiring increased core activation.     PATIENT EDUCATION:  Education details: intro to aquatic therapy   Person educated: Patient Education method: Explanation, Demonstration, Tactile cues, Verbal cues Education comprehension: verbalized understanding, returned demonstration, verbal cues required, tactile cues required, and needs further education  HOME EXERCISE PROGRAM: Access Code: [email or web address] URL: [email or web address] Date: 07/23/2023 Prepared by: Cori Helmus  Patient Education - Get To Know Your Pelvic Floor- Female - Urinary Urge Control Techniques - Bladder Retraining Strategies - Urinary Incontinence - Urinary Urge Control Techniques - Bladder Retraining   Aquatics This aquatic home exercise program from [employer] utilizes pictures from land based exercises, but has been adapted prior to lamination and issuance.   Access Code: [email or web address] URL: [email or web address] Date: 07/30/2023 Prepared by: Frankie Jaison Petraglia  Exercises - Hand buoy carry: Forward and Backward; bilaterally->unilaterally  - 1 x daily - 7 x weekly - 3 sets - 10 reps - Side to Side Hamstring Stretch with Noodle at [employer]  - 1 x daily - 7 x weekly - 3 sets - 10 reps - Lobbyist with Noodle at [employer]  - 1 x daily - 7 x weekly - 3 sets - 10 reps - Side Stepping with Hand Floats  - 1 x daily - 7 x weekly - 3 sets - 10 reps - Cycling on noodle/noodle wrapped under shoulders  - 1 x daily - 7 x weekly - 3 sets - 10 reps - Tandem Stance  - 1 x daily - 7 x weekly - 3 sets - 10 reps - Single Leg Stance  - 1 x daily - 7 x weekly - 3 sets - 10 reps - Heel Toe Raises with Counter Support   - 1 x daily - 7 x weekly - 3 sets - 10 reps - Noodle press  - 1 x daily - 7 x weekly - 3 sets - 10 reps ASSESSMENT:  CLINICAL IMPRESSION: Pt with good response from initial aquatic session.  Completed stretching as planned.  Added focus on LB stretching and posterior core strengthening as pt reports some LB discomfort with washing dishes and working on [employer].  She is given vc and demonstration as well as hand out (body mechanics) on proper posture with sitting and standing while doing dishes. Pt is wanting an hard copy of exercises soon to be able to complete at [facility]  Program initiated.         From initial evaluation:  Patient is a 63 y.o. F who was seen today for physical therapy evaluation and treatment for mixed urinary incontinence.. She reports that she broke a toe on her left foot, has been painful, has had right knee pain, had a meniscus repair in the past, her right hip and been painful as  well. With internal assessment findings notable tor anterior vaginal wall laxity, pelvic floor weakness in superficial layers but she has good coordination. She presents with tissue dryness with lack  of estrogen and she uses vaseline. Gave her samples of good clean love vaginal moisturizers and vulva balm.  Pt has difficulty with walking, knee and hip pain and will benefit from aqua therapy as well as possible ortho PT to reduce pain as decreased mobility makes it hard for her to make it to the bathroom on time. Her goals are not to wear pads and not to leak with intercourse/   OBJECTIVE IMPAIRMENTS: Abnormal gait, decreased activity tolerance, decreased balance, decreased endurance, decreased mobility, difficulty walking, decreased ROM, decreased strength, hypomobility, increased fascial restrictions, increased muscle spasms, obesity, and pain.   ACTIVITY LIMITATIONS: standing, squatting, transfers, continence, toileting, and locomotion level  PARTICIPATION LIMITATIONS: community  activity  PERSONAL FACTORS: Time since onset of injury/illness/exacerbation are also affecting patient's functional outcome.   REHAB POTENTIAL: Good  CLINICAL DECISION MAKING: Evolving/moderate complexity  EVALUATION COMPLEXITY: Moderate   GOALS: Goals reviewed with patient? Yes  SHORT TERM GOALS: Target date: 08/20/2023    Pt will be I and consistent with her HEP Baseline: Goal status: INITIAL  2.  Pt will be educated on vaginal moisturizers to improve vaginal tissue health and improve range of motion of the tissue for continence.  Baseline:  Goal status: INITIAL  3.  Pt will be educated on urge suppression techniques Baseline:  Goal status: INITIAL  4.  Pt will fill out a bladder diary to identify habits and triggers Baseline:  Goal status: INITIAL   LONG TERM GOALS: Target date: 01/22/2024  Pt will soak 0 pads/ day in order to run errands and not have to interrupt daily tasks.  Baseline:  Goal status: INITIAL  2.  Pt will be consistent with bladder retraining to reduce urinary urgency and frequency and reduce the need to go to bathroom more then every 2 hrs Baseline:  Goal status: INITIAL  3.  Pt will be able to have intercourse without leaking urine Baseline:  Goal status: INITIAL  4.  Pt will be able to walk to the bathroom without leaking 100% of time Baseline:  Goal status: INITIAL  5.  Pt will report reduced knee pain to max 2/10 with walking at least 30 mins Baseline:  Goal status: INITIAL  6.  Pt will report max 2/10 back and hip pain with walking at least 30 mins Baseline:  Goal status: INITIAL  PLAN:  PT FREQUENCY: 1-2x/week  PT DURATION: 6 months  PLANNED INTERVENTIONS: 97110-Therapeutic exercises, 97530- Therapeutic activity, 97112- Neuromuscular re-education, 97535- Self Care, [id number]- Manual therapy, [phone]- Aquatic Therapy, [phone]- Electrical stimulation (manual), [phone] (1-2 muscles), [phone] (3+ muscles)- Dry Needling, Patient/Family  education, Balance training, Taping, Joint mobilization, Joint manipulation, Spinal manipulation, Spinal mobilization, Scar mobilization, Cryotherapy, Moist heat, and Biofeedback  PLAN FOR NEXT SESSION: urge suppression techniques, trigger education, core strengthening, hip stretches   Ronal Foots) Meylin Stenzel MPT 07/30/23 3:08 PM [facility] [address] [place], [place], [address] Phone: [phone]   Fax:  [phone]

## 2023-08-07 ENCOUNTER — Ambulatory Visit: Attending: Obstetrics & Gynecology | Admitting: Physical Therapy

## 2023-08-07 ENCOUNTER — Encounter: Payer: Self-pay | Admitting: Physical Therapy

## 2023-08-07 DIAGNOSIS — R293 Abnormal posture: Secondary | ICD-10-CM | POA: Diagnosis present

## 2023-08-07 DIAGNOSIS — M6281 Muscle weakness (generalized): Secondary | ICD-10-CM | POA: Diagnosis present

## 2023-08-07 DIAGNOSIS — M25551 Pain in right hip: Secondary | ICD-10-CM | POA: Diagnosis present

## 2023-08-07 DIAGNOSIS — M25561 Pain in right knee: Secondary | ICD-10-CM | POA: Insufficient documentation

## 2023-08-07 DIAGNOSIS — R2689 Other abnormalities of gait and mobility: Secondary | ICD-10-CM | POA: Insufficient documentation

## 2023-08-07 DIAGNOSIS — G8929 Other chronic pain: Secondary | ICD-10-CM | POA: Insufficient documentation

## 2023-08-07 NOTE — Therapy (Signed)
 OUTPATIENT PHYSICAL THERAPY TREATMENT   Patient Name: Anna Martinez MRN: [id number] DOB:1960/11/29, 63 y.o., female Today's Date: 08/07/2023  END OF SESSION:  PT End of Session - 08/07/23 1404     Visit Number 4    Date for PT Re-Evaluation 01/22/24    Authorization Type [employer] 2025  no auth req    PT Start Time 1404    PT Stop Time 1445    PT Time Calculation (min) 41 min    Activity Tolerance Patient tolerated treatment well    Behavior During Therapy WFL for tasks assessed/performed            Past Medical History:  Diagnosis Date   Abnormal Pap smear 08/13/2012   LSIL may include HPV or mild Dysplasia (CIN1)   Acute lateral meniscus tear of right knee, subsequent encounter    Breast cancer (HCC) 05/2020   LEFT   Depression    Family history of breast cancer 05/30/2020   Family history of pancreatic cancer 05/30/2020   Family history of prostate cancer 05/30/2020   Family history of renal cancer 05/30/2020   Family history of skin cancer 05/30/2020   Gallbladder problem    Heart murmur    cardiologist a long time ago ECHO not completed   Hypertension    on meds   Knee pain    Lateral meniscus tear    right knee   Obesity    Other fatigue    Personal history of radiation therapy    Pre-diabetes    Shortness of breath on exertion    Sleep apnea    uses CPAP nightly   Vitamin D  deficiency    Past Surgical History:  Procedure Laterality Date   BREAST LUMPECTOMY Left 06/29/2020   BREAST LUMPECTOMY WITH RADIOACTIVE SEED AND SENTINEL LYMPH NODE BIOPSY Left 06/29/2020   Procedure: LEFT BREAST LUMPECTOMY WITH RADIOACTIVE SEED;  Surgeon: Curvin Deward MOULD, MD;  Location: [facility] OR;  Service: General;  Laterality: Left;   CYST REMOVAL TRUNK Left 06/29/2020   Procedure: REMOVAL OF SEBACEOUS CYST;  Surgeon: Cleotilde Ronal RAMAN, MD;  Location: [facility] OR;  Service: Gynecology;  Laterality: Left;   HYSTEROSCOPY WITH D & C N/A 06/29/2020   Procedure: DILATATION AND CURETTAGE  /HYSTEROSCOPY WITH MYOSURE;  Surgeon: Cleotilde Ronal RAMAN, MD;  Location: [facility] OR;  Service: Gynecology;  Laterality: N/A;   KNEE ARTHROSCOPY WITH LATERAL MENISECTOMY Right 10/03/2020   Procedure: KNEE ARTHROSCOPY WITH LATERAL MENISECTOMY;  Surgeon: Alfredia Charleston, MD;  Location: [facility];  Service: Orthopedics;  Laterality: Right;   LAPAROSCOPIC CHOLECYSTECTOMY  2001   SENTINEL NODE BIOPSY Left 06/29/2020   Procedure: LEFT SENTINEL LYMPH NODE BIOPSY;  Surgeon: Curvin Deward MOULD, MD;  Location: [facility] OR;  Service: General;  Laterality: Left;   Patient Active Problem List   Diagnosis Date Noted   Acute lateral meniscus tear of right knee, subsequent encounter 09/29/2020   Postmenopausal bleeding    Endometrial polyp    Vulvar cyst    Diabetes mellitus (HCC) 06/22/2020   Genetic testing 06/06/2020   Family history of breast cancer 05/30/2020   Family history of prostate cancer 05/30/2020   Family history of skin cancer 05/30/2020   Family history of pancreatic cancer 05/30/2020   Family history of renal cancer 05/30/2020   Malignant neoplasm of lower-outer quadrant of left breast of female, estrogen receptor positive (HCC) 05/18/2020   Hyperlipidemia 03/24/2020   Mixed hyperlipidemia 03/24/2020   Moderate recurrent major depression (HCC) 03/24/2020  Snoring 03/24/2020   Vitamin D  deficiency 03/24/2020   Lichen sclerosus et atrophicus 05/11/2019   Sleep apnea    Essential hypertension, benign 11/29/2015   Class 3 severe obesity with serious comorbidity and body mass index (BMI) of 50.0 to 59.9 in adult 08/21/2014    PCP: Dyane Anthony RAMAN, FNP  REFERRING PROVIDER: Cleotilde Ronal RAMAN, MD   REFERRING DIAG: [phone] (ICD-10-CM) - Mixed stress and urge urinary incontinence   THERAPY DIAG:  Other abnormalities of gait and mobility  Muscle weakness (generalized)  Chronic pain of right knee  Pain in right hip  Abnormal posture  Rationale for Evaluation and Treatment:  Rehabilitation  ONSET DATE: pt reports 10 years ago and has got worse recently  SUBJECTIVE:                                                                                                                                                                                           SUBJECTIVE STATEMENT: Pt reports that aqua therapy is wonderful Urge suppression is helping her Exercises are helping Has a trigger seeing her toilet Tried to put herself on the schedule, it is hard.    Fluid intake- eater, unsweet tea, 3 cups of coffee  PAIN:  Are you having pain? Yes bilat knees, R hip, R ankle NPRS scale: 3/10 Pain location: no pelvic pain  Pain type: aching Pain description: intermittent   Aggravating factors: lying on right side aggravates her hip, STS hurts her knees and legs, has toe pain as well Relieving factors: ibuprofen  PRECAUTIONS: None  RED FLAGS: None   WEIGHT BEARING RESTRICTIONS: No  FALLS:  Has patient fallen in last 6 months? No  OCCUPATION: retired [employer], does pottery  ACTIVITY LEVEL : more sedentary now, sometimes gets active  PLOF: Independent  PATIENT GOALS: to stop wearing pads every day. To get rid of incontinence during sex  PERTINENT HISTORY:  Breast cancer - was on estrogen blocker Sexual abuse: No  BOWEL MOVEMENT: no issues  URINATION: Pain with urination: No Fully empty bladder: Yes: if she rocks aftre she finishes peeing Stream: Strong Urgency: No Frequency: yes- on a diuretic Leakage: Urge to void, Walking to the bathroom, Coughing, Sneezing, Laughing, Intercourse, and has triggers- seeing  atoilet Pads: Yes: 3-4  INTERCOURSE:  Ability to have vaginal penetration Yes  Pain with intercourse: none DrynessYes - uses vaseline Climax: yes Marinoff Scale: 0/3 Laxative:no  PREGNANCY: Vaginal deliveries 1 Tearing Yes:   Episiotomy Yes  C-section deliveries 0 Currently pregnant No  PROLAPSE: Pressure-  possibly   OBJECTIVE:  Note: Objective measures were completed at Evaluation unless otherwise noted.  DIAGNOSTIC FINDINGS:  None recently  PATIENT SURVEYS:    PFIQ-7: 5  COGNITION: Overall cognitive status: Within functional limits for tasks assessed     SENSATION: Light touch: Appears intact  LUMBAR SPECIAL TESTS:  Straight leg raise test: Negative for pain  Single leg squat- difficult, has to hold on [employer]- has to hold on counter  GAIT: Assistive device utilized: None Comments: antalgic, appears difficult, wide stance, slow  POSTURE: rounded shoulders, forward head, increased lumbar lordosis, and anterior pelvic tilt   LUMBARAROM/PROM:  A/PROM A/PROM  Eval % of availability  Flexion 100  Extension 100  Right lateral flexion 75  Left lateral flexion 75  Right rotation 100  Left rotation 100   (Blank rows = not tested)  LOWER EXTREMITY ROM:  Passive ROM Right eval Left eval  Hip flexion    Hip extension    Hip abduction    Hip adduction    Hip internal rotation 25 D 5 D  Hip external rotation    Knee flexion    Knee extension    Ankle dorsiflexion    Ankle plantarflexion    Ankle inversion    Ankle eversion     (Blank rows = not tested)  LOWER EXTREMITY MMT:  MMT Right eval Left eval  Hip flexion 4+/5 4/5  Hip extension    Hip abduction    Hip adduction    Hip internal rotation    Hip external rotation    Knee flexion    Knee extension 4+/5 4+/5  Ankle dorsiflexion    Ankle plantarflexion    Ankle inversion    Ankle eversion     (Blank rows = not tested) PALPATION:   General: upper chest breathing strategies  Pelvic Alignment: even  Abdominal: diastasis present                External Perineal Exam: dryness present, signs of  decreased estrogen                              Internal Pelvic Floor: able to contract relax, bulge with good coordination  Patient confirms identification and approves PT to assess internal  pelvic floor and treatment Yes Patient confirms identification and approves PT to assess internal pelvic floor and treatment Yes No emotional/communication barriers or cognitive limitation. Patient is motivated to learn. Patient understands and agrees with treatment goals and plan. PT explains patient will be examined in standing, sitting, and lying down to see how their muscles and joints work. When they are ready, they will be asked to remove their underwear so PT can examine their perineum. The patient is also given the option of providing their own chaperone as one is not provided in our facility. The patient also has the right and is explained the right to defer or refuse any part of the evaluation or treatment including the internal exam. With the patient's consent, PT will use one gloved finger to gently assess the muscles of the pelvic floor, seeing how well it contracts and relaxes and if there is muscle symmetry. After, the patient will get dressed and PT and patient will discuss exam findings and plan of care. PT and patient discuss plan of care, schedule, attendance policy and HEP activities.     PELVIC MMT:   MMT eval  Vaginal 3/5, 3 s hold, 8 reps  Internal Anal Sphincter   External Anal Sphincter   Puborectalis  Diastasis Recti 2 fingers at umbilicus and above umbilicus with doming  (Blank rows = not tested)        TONE:            average  PROLAPSE: Mild anterior wall laxity present  TODAY'S TREATMENT:     08/07/2023 Neuro- seated ball press on unilat knees 15 reps with transverse abdominis breath  HA with thera band 10x2 reps Modified quarduped 15 reps with transverse abdominis breath  Shoulder extensions with green theraband with transverse abdominis breath 2x10 STS with transverse abdominis breath off higher surface Lifting #5 off chair with transverse abdominis breath 2x10                                                                                                                                  [facility] Adult PT Treatment:                                            07/30/23 Pt seen for aquatic therapy today.  Treatment took place in water 3.5-4.75 ft in depth at the [facility] pool. Temp of water was 91.  Pt entered/exited the pool via stairs independently in step-to pattern with bilat rail.  - unsupported - multiple laps walking forward/ backward - unsupported side stepping -> with arm addct/ abdct with yellow hand floats. Cues for abd bracing - suitcase carry with bilat yellow bilaterally then unilateral  marching forward/ backward -Hamstring/gastroc stretch at steps - hip hinging: 2 x 5 -tandem stance 4.59ft ue support yellow HB holding R/L x 20 secs->ue add/abd x10 -SLS 4.0 ft ue support YHB x 20s-> ue add/abd x 10 - UE support yellow HB:  toe/heel raises x12 -solid noodle pull down x 10 wide stance then staggered stances.    Pt requires the buoyancy and hydrostatic pressure of water for support, and to offload joints by unweighting joint load by at least 50 % in navel deep water and by at least 75-80% in chest to neck deep water.  Viscosity of the water is needed for resistance of strengthening. Water current perturbations provides challenge to standing balance requiring increased core activation.    [facility] Adult PT Treatment:                                            07/25/23 Pt seen for aquatic therapy today.  Treatment took place in water 3.5-4.75 ft in depth at the [facility] pool. Temp of water was 91.  Pt entered/exited the pool via stairs independently in step-to pattern with bilat rail.  - Intro to aquatic therapy principles - unsupported - multiple laps walking forward/ backward - unsupported side stepping -> with arm addct/ abdct  with yellow hand floats-> into wide squat (not tolerated) - suitcase carry with bilat yellow -> single yellow at side, -> bilat blue -> single blue at side, marching forward/ backward - UE  on wall:  toe/heel raises x12; hip add/abd x 12 ; hip flexion /extension x 10; relaxed squats x10 -seated on bench in water:  cycling LEs, LAQ with DF, hip abdct/ addct   - straddling noodle with no UE support (extra noodle underneath UE): cycling, cross country ski, suspended jumping jack   Pt requires the buoyancy and hydrostatic pressure of water for support, and to offload joints by unweighting joint load by at least 50 % in navel deep water and by at least 75-80% in chest to neck deep water.  Viscosity of the water is needed for resistance of strengthening. Water current perturbations provides challenge to standing balance requiring increased core activation.     PATIENT EDUCATION:  Education details: intro to aquatic therapy   Person educated: Patient Education method: Explanation, Demonstration, Tactile cues, Verbal cues Education comprehension: verbalized understanding, returned demonstration, verbal cues required, tactile cues required, and needs further education  HOME EXERCISE PROGRAM: Access Code: [email or web address] URL: [email or web address] Date: 07/23/2023 Prepared by: Cori Pleshette Tomasini  Patient Education - Get To Know Your Pelvic Floor- Female - Urinary Urge Control Techniques - Bladder Retraining Strategies - Urinary Incontinence - Urinary Urge Control Techniques - Bladder Retraining   Aquatics This aquatic home exercise program from Med[employer] utilizes pictures from land based exercises, but has been adapted prior to lamination and issuance.   Access Code: [email or web address] URL: [email or web address] Date: 07/30/2023 Prepared by: Frankie Ziemba  Exercises - Hand buoy carry: Forward and Backward; bilaterally->unilaterally  - 1 x daily - 7 x weekly - 3 sets - 10 reps - Side to Side Hamstring Stretch with Noodle at [employer]  - 1 x daily - 7 x weekly - 3 sets - 10 reps - Lobbyist with Noodle at [employer]  - 1 x daily - 7 x weekly - 3 sets - 10 reps -  Side Stepping with Hand Floats  - 1 x daily - 7 x weekly - 3 sets - 10 reps - Cycling on noodle/noodle wrapped under shoulders  - 1 x daily - 7 x weekly - 3 sets - 10 reps - Tandem Stance  - 1 x daily - 7 x weekly - 3 sets - 10 reps - Single Leg Stance  - 1 x daily - 7 x weekly - 3 sets - 10 reps - Heel Toe Raises with Counter Support  - 1 x daily - 7 x weekly - 3 sets - 10 reps - Noodle press  - 1 x daily - 7 x weekly - 3 sets - 10 reps ASSESSMENT:  CLINICAL IMPRESSION: Pt did well with exercises focusing on pressure management to help engage her pelvic floor and abdominals while stabilizing and strengthening. Pt wants to avoid surgery at all costs. Will see dr Guadlupe 7/23. Has difficulty with gait and mobility, some back pain with dishes and standing. Pt req VC's and TC's for new exercises today. She was educated on bladder irritants and healthy bladder recommendations.She will try decaf and peppermint tea vs black tea. Did not love the idea of 2 week water only bladder reset. She will continue to benefit fro PT to address deficits.      OBJECTIVE IMPAIRMENTS: Abnormal gait, decreased activity tolerance, decreased balance, decreased endurance, decreased mobility, difficulty walking, decreased ROM, decreased strength, hypomobility, increased fascial  restrictions, increased muscle spasms, obesity, and pain.   ACTIVITY LIMITATIONS: standing, squatting, transfers, continence, toileting, and locomotion level  PARTICIPATION LIMITATIONS: community activity  PERSONAL FACTORS: Time since onset of injury/illness/exacerbation are also affecting patient's functional outcome.   REHAB POTENTIAL: Good  CLINICAL DECISION MAKING: Evolving/moderate complexity  EVALUATION COMPLEXITY: Moderate   GOALS: Goals reviewed with patient? Yes  SHORT TERM GOALS: Target date: 08/20/2023    Pt will be I and consistent with her HEP Baseline: Goal status: met  2.  Pt will be educated on vaginal moisturizers to  improve vaginal tissue health and improve range of motion of the tissue for continence.  Baseline:  Goal status: INITIAL  3.  Pt will be educated on urge suppression techniques Baseline:  Goal status: met  4.  Pt will fill out a bladder diary to identify habits and triggers Baseline:  Goal status: INITIAL   LONG TERM GOALS: Target date: 01/22/2024  Pt will soak 0 pads/ day in order to run errands and not have to interrupt daily tasks.  Baseline:  Goal status: INITIAL  2.  Pt will be consistent with bladder retraining to reduce urinary urgency and frequency and reduce the need to go to bathroom more then every 2 hrs Baseline:  Goal status: INITIAL  3.  Pt will be able to have intercourse without leaking urine Baseline:  Goal status: INITIAL  4.  Pt will be able to walk to the bathroom without leaking 100% of time Baseline:  Goal status: INITIAL  5.  Pt will report reduced knee pain to max 2/10 with walking at least 30 mins Baseline:  Goal status: INITIAL  6.  Pt will report max 2/10 back and hip pain with walking at least 30 mins Baseline:  Goal status: INITIAL  PLAN:  PT FREQUENCY: 1-2x/week  PT DURATION: 6 months  PLANNED INTERVENTIONS: 97110-Therapeutic exercises, 97530- Therapeutic activity, 97112- Neuromuscular re-education, 97535- Self Care, [id number]- Manual therapy, [phone]- Aquatic Therapy, [phone]- Electrical stimulation (manual), [phone] (1-2 muscles), [phone] (3+ muscles)- Dry Needling, Patient/Family education, Balance training, Taping, Joint mobilization, Joint manipulation, Spinal manipulation, Spinal mobilization, Scar mobilization, Cryotherapy, Moist heat, and Biofeedback  PLAN FOR NEXT SESSION: urge suppression techniques, trigger education, core strengthening, hip stretches, bladder diary   Aroush Chasse, PT 08/07/23 2:05 PM

## 2023-08-15 ENCOUNTER — Ambulatory Visit (HOSPITAL_BASED_OUTPATIENT_CLINIC_OR_DEPARTMENT_OTHER): Attending: Obstetrics & Gynecology | Admitting: Physical Therapy

## 2023-08-15 ENCOUNTER — Encounter (HOSPITAL_BASED_OUTPATIENT_CLINIC_OR_DEPARTMENT_OTHER): Payer: Self-pay | Admitting: Physical Therapy

## 2023-08-15 DIAGNOSIS — M6281 Muscle weakness (generalized): Secondary | ICD-10-CM | POA: Diagnosis present

## 2023-08-15 DIAGNOSIS — R2689 Other abnormalities of gait and mobility: Secondary | ICD-10-CM | POA: Diagnosis present

## 2023-08-15 DIAGNOSIS — M25561 Pain in right knee: Secondary | ICD-10-CM | POA: Insufficient documentation

## 2023-08-15 DIAGNOSIS — G8929 Other chronic pain: Secondary | ICD-10-CM | POA: Diagnosis present

## 2023-08-15 NOTE — Therapy (Signed)
 OUTPATIENT PHYSICAL THERAPY TREATMENT   Patient Name: Anna Martinez MRN: [id number] DOB:02-12-1960, 63 y.o., female Today's Date: 08/15/2023  END OF SESSION:  PT End of Session - 08/15/23 1019     Visit Number 5    Date for PT Re-Evaluation 01/22/24    Authorization Type [employer]na [employer] 2025  no auth req    PT Start Time 1018    PT Stop Time 1058    PT Time Calculation (min) 40 min    Activity Tolerance Patient tolerated treatment well    Behavior During Therapy WFL for tasks assessed/performed            Past Medical History:  Diagnosis Date   Abnormal Pap smear 08/13/2012   LSIL may include HPV or mild Dysplasia (CIN1)   Acute lateral meniscus tear of right knee, subsequent encounter    Breast cancer (HCC) 05/2020   LEFT   Depression    Family history of breast cancer 05/30/2020   Family history of pancreatic cancer 05/30/2020   Family history of prostate cancer 05/30/2020   Family history of renal cancer 05/30/2020   Family history of skin cancer 05/30/2020   Gallbladder problem    Heart murmur    cardiologist a long time ago ECHO not completed   Hypertension    on meds   Knee pain    Lateral meniscus tear    right knee   Obesity    Other fatigue    Personal history of radiation therapy    Pre-diabetes    Shortness of breath on exertion    Sleep apnea    uses CPAP nightly   Vitamin D  deficiency    Past Surgical History:  Procedure Laterality Date   BREAST LUMPECTOMY Left 06/29/2020   BREAST LUMPECTOMY WITH RADIOACTIVE SEED AND SENTINEL LYMPH NODE BIOPSY Left 06/29/2020   Procedure: LEFT BREAST LUMPECTOMY WITH RADIOACTIVE SEED;  Surgeon: Curvin Deward MOULD, MD;  Location: [facility] OR;  Service: General;  Laterality: Left;   CYST REMOVAL TRUNK Left 06/29/2020   Procedure: REMOVAL OF SEBACEOUS CYST;  Surgeon: Cleotilde Ronal RAMAN, MD;  Location: [facility] OR;  Service: Gynecology;  Laterality: Left;   HYSTEROSCOPY WITH D & C N/A 06/29/2020   Procedure: DILATATION AND  CURETTAGE /HYSTEROSCOPY WITH MYOSURE;  Surgeon: Cleotilde Ronal RAMAN, MD;  Location: [facility] OR;  Service: Gynecology;  Laterality: N/A;   KNEE ARTHROSCOPY WITH LATERAL MENISECTOMY Right 10/03/2020   Procedure: KNEE ARTHROSCOPY WITH LATERAL MENISECTOMY;  Surgeon: Shelvy Charleston, MD;  Location: [facility];  Service: Orthopedics;  Laterality: Right;   LAPAROSCOPIC CHOLECYSTECTOMY  2001   SENTINEL NODE BIOPSY Left 06/29/2020   Procedure: LEFT SENTINEL LYMPH NODE BIOPSY;  Surgeon: Curvin Deward MOULD, MD;  Location: [facility] OR;  Service: General;  Laterality: Left;   Patient Active Problem List   Diagnosis Date Noted   Acute lateral meniscus tear of right knee, subsequent encounter 09/29/2020   Postmenopausal bleeding    Endometrial polyp    Vulvar cyst    Diabetes mellitus (HCC) 06/22/2020   Genetic testing 06/06/2020   Family history of breast cancer 05/30/2020   Family history of prostate cancer 05/30/2020   Family history of skin cancer 05/30/2020   Family history of pancreatic cancer 05/30/2020   Family history of renal cancer 05/30/2020   Malignant neoplasm of lower-outer quadrant of left breast of female, estrogen receptor positive (HCC) 05/18/2020   Hyperlipidemia 03/24/2020   Mixed hyperlipidemia 03/24/2020   Moderate recurrent major depression (HCC) 03/24/2020  Snoring 03/24/2020   Vitamin D  deficiency 03/24/2020   Lichen sclerosus et atrophicus 05/11/2019   Sleep apnea    Essential hypertension, benign 11/29/2015   Class 3 severe obesity with serious comorbidity and body mass index (BMI) of 50.0 to 59.9 in adult 08/21/2014    PCP: Dyane Anthony RAMAN, FNP  REFERRING PROVIDER: Cleotilde Ronal RAMAN, MD   REFERRING DIAG: [phone] (ICD-10-CM) - Mixed stress and urge urinary incontinence   THERAPY DIAG:  Other abnormalities of gait and mobility  Muscle weakness (generalized)  Chronic pain of right knee  Rationale for Evaluation and Treatment: Rehabilitation  ONSET DATE: pt reports 10  years ago and has got worse recently  SUBJECTIVE:                                                                                                                                                                                           SUBJECTIVE STATEMENT: Pt reports completing a chair yoga  class at the [facility] and all joints hurt.  Reports completing some mini squats increased knee pain.   Fluid intake- eater, unsweet tea, 3 cups of coffee  PAIN:  Are you having pain? Yes bilat knees, R hip, R ankle NPRS scale: 3/10 Pain location: no pelvic pain  Pain type: aching Pain description: intermittent   Aggravating factors: lying on right side aggravates her hip, STS hurts her knees and legs, has toe pain as well Relieving factors: ibuprofen  PRECAUTIONS: None  RED FLAGS: None   WEIGHT BEARING RESTRICTIONS: No  FALLS:  Has patient fallen in last 6 months? No  OCCUPATION: retired [employer], does pottery  ACTIVITY LEVEL : more sedentary now, sometimes gets active  PLOF: Independent  PATIENT GOALS: to stop wearing pads every day. To get rid of incontinence during sex  PERTINENT HISTORY:  Breast cancer - was on estrogen blocker Sexual abuse: No  BOWEL MOVEMENT: no issues  URINATION: Pain with urination: No Fully empty bladder: Yes: if she rocks aftre she finishes peeing Stream: Strong Urgency: No Frequency: yes- on a diuretic Leakage: Urge to void, Walking to the bathroom, Coughing, Sneezing, Laughing, Intercourse, and has triggers- seeing  atoilet Pads: Yes: 3-4  INTERCOURSE:  Ability to have vaginal penetration Yes  Pain with intercourse: none DrynessYes - uses vaseline Climax: yes Marinoff Scale: 0/3 Laxative:no  PREGNANCY: Vaginal deliveries 1 Tearing Yes:   Episiotomy Yes  C-section deliveries 0 Currently pregnant No  PROLAPSE: Pressure- possibly   OBJECTIVE:  Note: Objective measures were completed at Evaluation unless otherwise  noted.  DIAGNOSTIC FINDINGS:  None recently  PATIENT SURVEYS:    PFIQ-7: 5  COGNITION:  Overall cognitive status: Within functional limits for tasks assessed     SENSATION: Light touch: Appears intact  LUMBAR SPECIAL TESTS:  Straight leg raise test: Negative for pain  Single leg squat- difficult, has to hold on [employer]- has to hold on counter  GAIT: Assistive device utilized: None Comments: antalgic, appears difficult, wide stance, slow  POSTURE: rounded shoulders, forward head, increased lumbar lordosis, and anterior pelvic tilt   LUMBARAROM/PROM:  A/PROM A/PROM  Eval % of availability  Flexion 100  Extension 100  Right lateral flexion 75  Left lateral flexion 75  Right rotation 100  Left rotation 100   (Blank rows = not tested)  LOWER EXTREMITY ROM:  Passive ROM Right eval Left eval  Hip flexion    Hip extension    Hip abduction    Hip adduction    Hip internal rotation 25 D 5 D  Hip external rotation    Knee flexion    Knee extension    Ankle dorsiflexion    Ankle plantarflexion    Ankle inversion    Ankle eversion     (Blank rows = not tested)  LOWER EXTREMITY MMT:  MMT Right eval Left eval  Hip flexion 4+/5 4/5  Hip extension    Hip abduction    Hip adduction    Hip internal rotation    Hip external rotation    Knee flexion    Knee extension 4+/5 4+/5  Ankle dorsiflexion    Ankle plantarflexion    Ankle inversion    Ankle eversion     (Blank rows = not tested) PALPATION:   General: upper chest breathing strategies  Pelvic Alignment: even  Abdominal: diastasis present                External Perineal Exam: dryness present, signs of  decreased estrogen                              Internal Pelvic Floor: able to contract relax, bulge with good coordination  Patient confirms identification and approves PT to assess internal pelvic floor and treatment Yes Patient confirms identification and approves PT to assess internal  pelvic floor and treatment Yes No emotional/communication barriers or cognitive limitation. Patient is motivated to learn. Patient understands and agrees with treatment goals and plan. PT explains patient will be examined in standing, sitting, and lying down to see how their muscles and joints work. When they are ready, they will be asked to remove their underwear so PT can examine their perineum. The patient is also given the option of providing their own chaperone as one is not provided in our facility. The patient also has the right and is explained the right to defer or refuse any part of the evaluation or treatment including the internal exam. With the patient's consent, PT will use one gloved finger to gently assess the muscles of the pelvic floor, seeing how well it contracts and relaxes and if there is muscle symmetry. After, the patient will get dressed and PT and patient will discuss exam findings and plan of care. PT and patient discuss plan of care, schedule, attendance policy and HEP activities.     PELVIC MMT:   MMT eval  Vaginal 3/5, 3 s hold, 8 reps  Internal Anal Sphincter   External Anal Sphincter   Puborectalis   Diastasis Recti 2 fingers at umbilicus and above umbilicus with doming  (Blank rows =  not tested)        TONE:            average  PROLAPSE: Mild anterior wall laxity present  TODAY'S TREATMENT:   [facility] Adult PT Treatment:                                            08/15/23 Pt seen for aquatic therapy today.  Treatment took place in water 3.5-4.75 ft in depth at the [facility] pool. Temp of water was 91.  Pt entered/exited the pool via stairs independently in step-to pattern with bilat rail.  - unsupported - multiple laps walking forward/ backward - unsupported side stepping -> with arm addct/ abdct with yellow hand floats. Cues for abd bracing -standing ue support HB: df; pf; hip add/abd; traditional squats; hip flex/ext -warrior III with ue support  YHB -walking between exercises for recovery - hip hinging: 2 x 5.  VC provided for execution -L stretch -> hip hiking -cycling on noodle  Pt requires the buoyancy and hydrostatic pressure of water for support, and to offload joints by unweighting joint load by at least 50 % in navel deep water and by at least 75-80% in chest to neck deep water.  Viscosity of the water is needed for resistance of strengthening. Water current perturbations provides challenge to standing balance requiring increased core activation.    08/07/2023 Neuro- seated ball press on unilat knees 15 reps with transverse abdominis breath  HA with thera band 10x2 reps Modified quarduped 15 reps with transverse abdominis breath  Shoulder extensions with green theraband with transverse abdominis breath 2x10 STS with transverse abdominis breath off higher surface Lifting #5 off chair with transverse abdominis breath 2x10                                                                                                                            PATIENT EDUCATION:  Education details: intro to aquatic therapy   Person educated: Patient Education method: Explanation, Demonstration, Tactile cues, Verbal cues Education comprehension: verbalized understanding, returned demonstration, verbal cues required, tactile cues required, and needs further education  HOME EXERCISE PROGRAM: Access Code: [email or web address] URL: [email or web address] Date: 07/23/2023 Prepared by: Cori Helmus  Patient Education - Get To Know Your Pelvic Floor- Female - Urinary Urge Control Techniques - Bladder Retraining Strategies - Urinary Incontinence - Urinary Urge Control Techniques - Bladder Retraining   Aquatics This aquatic home exercise program from [employer] utilizes pictures from land based exercises, but has been adapted prior to lamination and issuance.   Access Code: [email or web address] URL: [email or web address] Date:  07/30/2023 Prepared by: Frankie Imojean Yoshino  Exercises - Hand buoy carry: Forward and Backward; bilaterally->unilaterally  - 1 x daily - 7 x weekly - 3 sets - 10 reps - Side to Side Hamstring Stretch with Noodle at [employer]  - 1 x  daily - 7 x weekly - 3 sets - 10 reps - Lobbyist with Noodle at [employer]  - 1 x daily - 7 x weekly - 3 sets - 10 reps - Side Stepping with Hand Floats  - 1 x daily - 7 x weekly - 3 sets - 10 reps - Cycling on noodle/noodle wrapped under shoulders  - 1 x daily - 7 x weekly - 3 sets - 10 reps - Tandem Stance  - 1 x daily - 7 x weekly - 3 sets - 10 reps - Single Leg Stance  - 1 x daily - 7 x weekly - 3 sets - 10 reps - Heel Toe Raises with Counter Support  - 1 x daily - 7 x weekly - 3 sets - 10 reps - Noodle press  - 1 x daily - 7 x weekly - 3 sets - 10 reps ASSESSMENT:  CLINICAL IMPRESSION: Pt advised to avoid squats out of water due to the chronic nature/OA of knee joints.  She is encouraged to return to regular exercises using pool at senior center.  Good toleration to progressed strengthening and balance/proprioceptive retraining. Cues for abd bracing and Kegel's for core and pelvic engagement with balance activities. Pt instructed to complete L stretch at home. Good session with reduction of pain while submerged to 0/10.  She does report right knee popping throughout. HEP creation initiated.  Will plan on instructing with issuance next session.    OBJECTIVE IMPAIRMENTS: Abnormal gait, decreased activity tolerance, decreased balance, decreased endurance, decreased mobility, difficulty walking, decreased ROM, decreased strength, hypomobility, increased fascial restrictions, increased muscle spasms, obesity, and pain.   ACTIVITY LIMITATIONS: standing, squatting, transfers, continence, toileting, and locomotion level  PARTICIPATION LIMITATIONS: community activity  PERSONAL FACTORS: Time since onset of injury/illness/exacerbation are also affecting patient's  functional outcome.   REHAB POTENTIAL: Good  CLINICAL DECISION MAKING: Evolving/moderate complexity  EVALUATION COMPLEXITY: Moderate   GOALS: Goals reviewed with patient? Yes  SHORT TERM GOALS: Target date: 08/20/2023    Pt will be I and consistent with her HEP Baseline: Goal status: met  2.  Pt will be educated on vaginal moisturizers to improve vaginal tissue health and improve range of motion of the tissue for continence.  Baseline:  Goal status: INITIAL  3.  Pt will be educated on urge suppression techniques Baseline:  Goal status: met  4.  Pt will fill out a bladder diary to identify habits and triggers Baseline:  Goal status: INITIAL   LONG TERM GOALS: Target date: 01/22/2024  Pt will soak 0 pads/ day in order to run errands and not have to interrupt daily tasks.  Baseline:  Goal status: INITIAL  2.  Pt will be consistent with bladder retraining to reduce urinary urgency and frequency and reduce the need to go to bathroom more then every 2 hrs Baseline:  Goal status: INITIAL  3.  Pt will be able to have intercourse without leaking urine Baseline:  Goal status: INITIAL  4.  Pt will be able to walk to the bathroom without leaking 100% of time Baseline:  Goal status: INITIAL  5.  Pt will report reduced knee pain to max 2/10 with walking at least 30 mins Baseline:  Goal status: INITIAL  6.  Pt will report max 2/10 back and hip pain with walking at least 30 mins Baseline:  Goal status: INITIAL  PLAN:  PT FREQUENCY: 1-2x/week  PT DURATION: 6 months  PLANNED INTERVENTIONS: 97110-Therapeutic exercises, 97530- Therapeutic activity, [id number]- Neuromuscular  re-education, [phone]- Self Care, 02859- Manual therapy, [phone]- Aquatic Therapy, [phone]- Electrical stimulation (manual), [phone] (1-2 muscles), [phone] (3+ muscles)- Dry Needling, Patient/Family education, Balance training, Taping, Joint mobilization, Joint manipulation, Spinal manipulation, Spinal mobilization,  Scar mobilization, Cryotherapy, Moist heat, and Biofeedback  PLAN FOR NEXT SESSION: urge suppression techniques, trigger education, core strengthening, hip stretches, bladder diary   Ronal Foots) Tatayana Beshears MPT 08/15/23 10:21 AM [facility] [address] [place], [place], [address] Phone: [phone]   Fax:  [phone]

## 2023-08-20 ENCOUNTER — Ambulatory Visit: Payer: Self-pay | Admitting: Physical Therapy

## 2023-08-20 ENCOUNTER — Encounter: Payer: Self-pay | Admitting: Physical Therapy

## 2023-08-20 DIAGNOSIS — M6281 Muscle weakness (generalized): Secondary | ICD-10-CM

## 2023-08-20 DIAGNOSIS — R293 Abnormal posture: Secondary | ICD-10-CM

## 2023-08-20 DIAGNOSIS — M25551 Pain in right hip: Secondary | ICD-10-CM

## 2023-08-20 DIAGNOSIS — R2689 Other abnormalities of gait and mobility: Secondary | ICD-10-CM

## 2023-08-20 DIAGNOSIS — G8929 Other chronic pain: Secondary | ICD-10-CM

## 2023-08-20 NOTE — Therapy (Signed)
 OUTPATIENT PHYSICAL THERAPY PELVIC TREATMENT   Patient Name: Anna Martinez MRN: [id number] DOB:09-18-1960, 63 y.o., female Today's Date: 08/20/2023  END OF SESSION:  PT End of Session - 08/20/23 1059     Visit Number 6    Date for PT Re-Evaluation 01/22/24    Authorization Type [employer]na [employer] [id number]  no auth req    PT Start Time 1059    PT Stop Time 1158    PT Time Calculation (min) 59 min    Activity Tolerance Patient tolerated treatment well    Behavior During Therapy WFL for tasks assessed/performed             Past Medical History:  Diagnosis Date   Abnormal Pap smear 08/13/2012   LSIL may include HPV or mild Dysplasia (CIN1)   Acute lateral meniscus tear of right knee, subsequent encounter    Breast cancer (HCC) 05/2020   LEFT   Depression    Family history of breast cancer 05/30/2020   Family history of pancreatic cancer 05/30/2020   Family history of prostate cancer 05/30/2020   Family history of renal cancer 05/30/2020   Family history of skin cancer 05/30/2020   Gallbladder problem    Heart murmur    cardiologist a long time ago ECHO not completed   Hypertension    on meds   Knee pain    Lateral meniscus tear    right knee   Obesity    Other fatigue    Personal history of radiation therapy    Pre-diabetes    Shortness of breath on exertion    Sleep apnea    uses CPAP nightly   Vitamin D  deficiency    Past Surgical History:  Procedure Laterality Date   BREAST LUMPECTOMY Left 06/29/2020   BREAST LUMPECTOMY WITH RADIOACTIVE SEED AND SENTINEL LYMPH NODE BIOPSY Left 06/29/2020   Procedure: LEFT BREAST LUMPECTOMY WITH RADIOACTIVE SEED;  Surgeon: Curvin Deward MOULD, MD;  Location: [facility] OR;  Service: General;  Laterality: Left;   CYST REMOVAL TRUNK Left 06/29/2020   Procedure: REMOVAL OF SEBACEOUS CYST;  Surgeon: Cleotilde Ronal RAMAN, MD;  Location: [facility] OR;  Service: Gynecology;  Laterality: Left;   HYSTEROSCOPY WITH D & C N/A 06/29/2020   Procedure: DILATATION AND  CURETTAGE /HYSTEROSCOPY WITH MYOSURE;  Surgeon: Cleotilde Ronal RAMAN, MD;  Location: [facility] OR;  Service: Gynecology;  Laterality: N/A;   KNEE ARTHROSCOPY WITH LATERAL MENISECTOMY Right 10/03/2020   Procedure: KNEE ARTHROSCOPY WITH LATERAL MENISECTOMY;  Surgeon: Vaishnavi Charleston, MD;  Location: [facility];  Service: Orthopedics;  Laterality: Right;   LAPAROSCOPIC CHOLECYSTECTOMY  2001   SENTINEL NODE BIOPSY Left 06/29/2020   Procedure: LEFT SENTINEL LYMPH NODE BIOPSY;  Surgeon: Curvin Deward MOULD, MD;  Location: [facility] OR;  Service: General;  Laterality: Left;   Patient Active Problem List   Diagnosis Date Noted   Acute lateral meniscus tear of right knee, subsequent encounter 09/29/2020   Postmenopausal bleeding    Endometrial polyp    Vulvar cyst    Diabetes mellitus (HCC) 06/22/2020   Genetic testing 06/06/2020   Family history of breast cancer 05/30/2020   Family history of prostate cancer 05/30/2020   Family history of skin cancer 05/30/2020   Family history of pancreatic cancer 05/30/2020   Family history of renal cancer 05/30/2020   Malignant neoplasm of lower-outer quadrant of left breast of female, estrogen receptor positive (HCC) 05/18/2020   Hyperlipidemia 03/24/2020   Mixed hyperlipidemia 03/24/2020   Moderate recurrent major depression (  HCC) 03/24/2020   Snoring 03/24/2020   Vitamin D  deficiency 03/24/2020   Lichen sclerosus et atrophicus 05/11/2019   Sleep apnea    Essential hypertension, benign 11/29/2015   Class 3 severe obesity with serious comorbidity and body mass index (BMI) of 50.0 to 59.9 in adult 08/21/2014    PCP: Dyane Anthony RAMAN, FNP  REFERRING PROVIDER: Cleotilde Ronal RAMAN, MD   REFERRING DIAG: [phone] (ICD-10-CM) - Mixed stress and urge urinary incontinence   THERAPY DIAG:  Other abnormalities of gait and mobility  Muscle weakness (generalized)  Chronic pain of right knee  Pain in right hip  Abnormal posture  Rationale for Evaluation and Treatment:  Rehabilitation  ONSET DATE: pt reports 10 years ago and has got worse recently  SUBJECTIVE:                                                                                                                                                                                           SUBJECTIVE STATEMENT: Patient reports that she is improving, exercises help, she has to get them done first thing in the morning.  In the middle of the night still leaks when her knees are not working yet, when she gets up, when her bladder is full, she has an issue.  Does not want any knee surgery.  She goes to bed 11;30 or later, gets up 6:30, has to get up 2 hours later, but drinks water until she goes to bed Has a C pap   Fluid intake- eater, unsweet tea, 3 cups of coffee  PAIN:  Are you having pain? Yes bilat knees, R hip, R ankle NPRS scale: 3/10 Pain location: no pelvic pain  Pain type: aching Pain description: intermittent   Aggravating factors: lying on right side aggravates her hip, STS hurts her knees and legs, has toe pain as well Relieving factors: ibuprofen  PRECAUTIONS: None  RED FLAGS: None   WEIGHT BEARING RESTRICTIONS: No  FALLS:  Has patient fallen in last 6 months? No  OCCUPATION: retired [employer], does pottery  ACTIVITY LEVEL : more sedentary now, sometimes gets active  PLOF: Independent  PATIENT GOALS: to stop wearing pads every day. To get rid of incontinence during sex  PERTINENT HISTORY:  Breast cancer - was on estrogen blocker Sexual abuse: No  BOWEL MOVEMENT: no issues  URINATION: Pain with urination: No Fully empty bladder: Yes: if she rocks aftre she finishes peeing Stream: Strong Urgency: No Frequency: yes- on a diuretic Leakage: Urge to void, Walking to the bathroom, Coughing, Sneezing, Laughing, Intercourse, and has triggers- seeing  atoilet Pads: Yes: 3-4  INTERCOURSE:  Ability to  have vaginal penetration Yes  Pain with intercourse:  none DrynessYes - uses vaseline Climax: yes Marinoff Scale: 0/3 Laxative:no  PREGNANCY: Vaginal deliveries 1 Tearing Yes:   Episiotomy Yes  C-section deliveries 0 Currently pregnant No  PROLAPSE: Pressure- possibly   OBJECTIVE:  Note: Objective measures were completed at Evaluation unless otherwise noted.  DIAGNOSTIC FINDINGS:  None recently  PATIENT SURVEYS:    PFIQ-7: 5  COGNITION: Overall cognitive status: Within functional limits for tasks assessed     SENSATION: Light touch: Appears intact  LUMBAR SPECIAL TESTS:  Straight leg raise test: Negative for pain  Single leg squat- difficult, has to hold on [employer]- has to hold on counter  GAIT: Assistive device utilized: None Comments: antalgic, appears difficult, wide stance, slow  POSTURE: rounded shoulders, forward head, increased lumbar lordosis, and anterior pelvic tilt   LUMBARAROM/PROM:  A/PROM A/PROM  Eval % of availability  Flexion 100  Extension 100  Right lateral flexion 75  Left lateral flexion 75  Right rotation 100  Left rotation 100   (Blank rows = not tested)  LOWER EXTREMITY ROM:  Passive ROM Right eval Left eval  Hip flexion    Hip extension    Hip abduction    Hip adduction    Hip internal rotation 25 D 5 D  Hip external rotation    Knee flexion    Knee extension    Ankle dorsiflexion    Ankle plantarflexion    Ankle inversion    Ankle eversion     (Blank rows = not tested)  LOWER EXTREMITY MMT:  MMT Right eval Left eval  Hip flexion 4+/5 4/5  Hip extension    Hip abduction    Hip adduction    Hip internal rotation    Hip external rotation    Knee flexion    Knee extension 4+/5 4+/5  Ankle dorsiflexion    Ankle plantarflexion    Ankle inversion    Ankle eversion     (Blank rows = not tested) PALPATION:   General: upper chest breathing strategies  Pelvic Alignment: even  Abdominal: diastasis present                External Perineal Exam:  dryness present, signs of  decreased estrogen                              Internal Pelvic Floor: able to contract relax, bulge with good coordination  Patient confirms identification and approves PT to assess internal pelvic floor and treatment Yes Patient confirms identification and approves PT to assess internal pelvic floor and treatment Yes No emotional/communication barriers or cognitive limitation. Patient is motivated to learn. Patient understands and agrees with treatment goals and plan. PT explains patient will be examined in standing, sitting, and lying down to see how their muscles and joints work. When they are ready, they will be asked to remove their underwear so PT can examine their perineum. The patient is also given the option of providing their own chaperone as one is not provided in our facility. The patient also has the right and is explained the right to defer or refuse any part of the evaluation or treatment including the internal exam. With the patient's consent, PT will use one gloved finger to gently assess the muscles of the pelvic floor, seeing how well it contracts and relaxes and if there is muscle symmetry. After, the patient will get dressed  and PT and patient will discuss exam findings and plan of care. PT and patient discuss plan of care, schedule, attendance policy and HEP activities.     PELVIC MMT:   MMT eval  Vaginal 3/5, 3 s hold, 8 reps  Internal Anal Sphincter   External Anal Sphincter   Puborectalis   Diastasis Recti 2 fingers at umbilicus and above umbilicus with doming  (Blank rows = not tested)        TONE:            average  PROLAPSE: Mild anterior wall laxity present  TODAY'S TREATMENT:   There acts- review of HEP, progress, bladder habits, hydration Neuro reed- ball press with transverse abdominis breath                       Ball press seated with transverse abdominis breath 20 reps bilateral HA with green theraband 20 reps with  transverse abdominis breath and pelvic floor contraction Dying bug with yoga ball 2x10 Hip adduction with ball with with green theraband 20 reps Overhead press with #5 20 reps STS with #10 20 reps more elevated table Nustep 5 mins level 7                             [facility] Adult PT Treatment:                                            08/15/23 Pt seen for aquatic therapy today.  Treatment took place in water 3.5-4.75 ft in depth at the [facility] pool. Temp of water was 91.  Pt entered/exited the pool via stairs independently in step-to pattern with bilat rail.  - unsupported - multiple laps walking forward/ backward - unsupported side stepping -> with arm addct/ abdct with yellow hand floats. Cues for abd bracing -standing ue support HB: df; pf; hip add/abd; traditional squats; hip flex/ext -warrior III with ue support YHB -walking between exercises for recovery - hip hinging: 2 x 5.  VC provided for execution -L stretch -> hip hiking -cycling on noodle  Pt requires the buoyancy and hydrostatic pressure of water for support, and to offload joints by unweighting joint load by at least 50 % in navel deep water and by at least 75-80% in chest to neck deep water.  Viscosity of the water is needed for resistance of strengthening. Water current perturbations provides challenge to standing balance requiring increased core activation.    08/07/2023 Neuro- seated ball press on unilat knees 15 reps with transverse abdominis breath  HA with thera band 10x2 reps Modified quarduped 15 reps with transverse abdominis breath  Shoulder extensions with green theraband with transverse abdominis breath 2x10 STS with transverse abdominis breath off higher surface  Lifting #5 off chair with transverse abdominis breath 2x10  PATIENT EDUCATION:  Education details: intro  to aquatic therapy   Person educated: Patient Education method: Explanation, Demonstration, Tactile cues, Verbal cues Education comprehension: verbalized understanding, returned demonstration, verbal cues required, tactile cues required, and needs further education  HOME EXERCISE PROGRAM: Access Code: [email or web address] URL: [email or web address] Date: 07/23/2023 Prepared by: Cori Bertis Hustead  Patient Education - Get To Know Your Pelvic Floor- Female - Urinary Urge Control Techniques - Bladder Retraining Strategies - Urinary Incontinence - Urinary Urge Control Techniques - Bladder Retraining   Aquatics This aquatic home exercise program from [employer] utilizes pictures from land based exercises, but has been adapted prior to lamination and issuance.   Access Code: [email or web address] URL: [email or web address] Date: 07/30/2023 Prepared by: Frankie Ziemba  Exercises - Hand buoy carry: Forward and Backward; bilaterally->unilaterally  - 1 x daily - 7 x weekly - 3 sets - 10 reps - Side to Side Hamstring Stretch with Noodle at [employer]  - 1 x daily - 7 x weekly - 3 sets - 10 reps - Lobbyist with Noodle at [employer]  - 1 x daily - 7 x weekly - 3 sets - 10 reps - Side Stepping with Hand Floats  - 1 x daily - 7 x weekly - 3 sets - 10 reps - Cycling on noodle/noodle wrapped under shoulders  - 1 x daily - 7 x weekly - 3 sets - 10 reps - Tandem Stance  - 1 x daily - 7 x weekly - 3 sets - 10 reps - Single Leg Stance  - 1 x daily - 7 x weekly - 3 sets - 10 reps - Heel Toe Raises with Counter Support  - 1 x daily - 7 x weekly - 3 sets - 10 reps - Noodle press  - 1 x daily - 7 x weekly - 3 sets - 10 reps ASSESSMENT:  CLINICAL IMPRESSION:   Patient was seen today for treatment of mixed urinary incontinence. Patient with some bilateral knee pain and decreased mobility. Patient did well with exercises and education today. We discussed progress, added trial of Nustep and recommended  drinking fluids earlier in the day to reduce night time urgency. Patient to take her diuretic not at night either. Treatment session focused on discussing bladder habits to improve bladder urgency and exercises for core with pressure management while avoiding irritating knee pain. Patient had some  difficulty with ambulation. Patient is progressing well towards goals and will benefit from continued PT to address deficits, reduce leaking and improve quality of life.      OBJECTIVE IMPAIRMENTS: Abnormal gait, decreased activity tolerance, decreased balance, decreased endurance, decreased mobility, difficulty walking, decreased ROM, decreased strength, hypomobility, increased fascial restrictions, increased muscle spasms, obesity, and pain.   ACTIVITY LIMITATIONS: standing, squatting, transfers, continence, toileting, and locomotion level  PARTICIPATION LIMITATIONS: community activity  PERSONAL FACTORS: Time since onset of injury/illness/exacerbation are also affecting patient's functional outcome.   REHAB POTENTIAL: Good  CLINICAL DECISION MAKING: Evolving/moderate complexity  EVALUATION COMPLEXITY: Moderate   GOALS: Goals reviewed with patient? Yes  SHORT TERM GOALS: Target date: 08/20/2023    Pt will be I and consistent with her HEP Baseline: Goal status: met  2.  Pt will be educated on vaginal moisturizers to improve vaginal tissue health and improve range of motion of the tissue for continence.  Baseline:  Goal status: INITIAL  3.  Pt will be educated on urge suppression techniques Baseline:  Goal status: met  4.  Pt will fill out a bladder diary  to identify habits and triggers Baseline:  Goal status: INITIAL   LONG TERM GOALS: Target date: 01/22/2024  Pt will soak 0 pads/ day in order to run errands and not have to interrupt daily tasks.  Baseline:  Goal status: progressing, still leaks at night  2.  Pt will be consistent with bladder retraining to reduce urinary  urgency and frequency and reduce the need to go to bathroom more then every 2 hrs Baseline:  Goal status: INITIAL  3.  Pt will be able to have intercourse without leaking urine Baseline:  Goal status: INITIAL  4.  Pt will be able to walk to the bathroom without leaking 100% of time Baseline:  Goal status: INITIAL  5.  Pt will report reduced knee pain to max 2/10 with walking at least 30 mins Baseline:  Goal status: INITIAL  6.  Pt will report max 2/10 back and hip pain with walking at least 30 mins Baseline:  Goal status: INITIAL  PLAN:  PT FREQUENCY: 1-2x/week  PT DURATION: 6 months  PLANNED INTERVENTIONS: 97110-Therapeutic exercises, 97530- Therapeutic activity, 97112- Neuromuscular re-education, 97535- Self Care, [id number]- Manual therapy, [phone]- Aquatic Therapy, [phone]- Electrical stimulation (manual), [phone] (1-2 muscles), [phone] (3+ muscles)- Dry Needling, Patient/Family education, Balance training, Taping, Joint mobilization, Joint manipulation, Spinal manipulation, Spinal mobilization, Scar mobilization, Cryotherapy, Moist heat, and Biofeedback  PLAN FOR NEXT SESSION: urge suppression techniques, trigger education, core strengthening, hip stretches, bladder diary   Ronal Foots) Ziemba MPT 08/20/23 11:59 AM [facility] [address] [place], [place], [address] Phone: [phone]   Fax:  [phone]

## 2023-08-22 ENCOUNTER — Encounter (HOSPITAL_BASED_OUTPATIENT_CLINIC_OR_DEPARTMENT_OTHER): Payer: Self-pay | Admitting: Physical Therapy

## 2023-08-22 ENCOUNTER — Ambulatory Visit (HOSPITAL_BASED_OUTPATIENT_CLINIC_OR_DEPARTMENT_OTHER): Admitting: Physical Therapy

## 2023-08-22 DIAGNOSIS — M6281 Muscle weakness (generalized): Secondary | ICD-10-CM

## 2023-08-22 DIAGNOSIS — R2689 Other abnormalities of gait and mobility: Secondary | ICD-10-CM | POA: Diagnosis not present

## 2023-08-22 DIAGNOSIS — G8929 Other chronic pain: Secondary | ICD-10-CM

## 2023-08-22 NOTE — Therapy (Addendum)
 OUTPATIENT PHYSICAL THERAPY PELVIC TREATMENT/ later date discharge   Patient Name: Anna Martinez MRN: [id number] DOB:Oct 28, 1960, 63 y.o., female Today's Date: 08/22/2023  END OF SESSION:  PT End of Session - 08/22/23 0932     Visit Number 7    Date for PT Re-Evaluation 01/22/24    Authorization Type [employer]na [employer] [id number]  no auth req    PT Start Time [phone]    PT Stop Time 1010    PT Time Calculation (min) 39 min    Activity Tolerance Patient tolerated treatment well    Behavior During Therapy [facility] for tasks assessed/performed             Past Medical History:  Diagnosis Date   Abnormal Pap smear 08/13/2012   LSIL may include HPV or mild Dysplasia (CIN1)   Acute lateral meniscus tear of right knee, subsequent encounter    Breast cancer (HCC) 05/2020   LEFT   Depression    Family history of breast cancer 05/30/2020   Family history of pancreatic cancer 05/30/2020   Family history of prostate cancer 05/30/2020   Family history of renal cancer 05/30/2020   Family history of skin cancer 05/30/2020   Gallbladder problem    Heart murmur    cardiologist a long time ago ECHO not completed   Hypertension    on meds   Knee pain    Lateral meniscus tear    right knee   Obesity    Other fatigue    Personal history of radiation therapy    Pre-diabetes    Shortness of breath on exertion    Sleep apnea    uses CPAP nightly   Vitamin D  deficiency    Past Surgical History:  Procedure Laterality Date   BREAST LUMPECTOMY Left 06/29/2020   BREAST LUMPECTOMY WITH RADIOACTIVE SEED AND SENTINEL LYMPH NODE BIOPSY Left 06/29/2020   Procedure: LEFT BREAST LUMPECTOMY WITH RADIOACTIVE SEED;  Surgeon: Curvin Deward MOULD, MD;  Location: [facility] OR;  Service: General;  Laterality: Left;   CYST REMOVAL TRUNK Left 06/29/2020   Procedure: REMOVAL OF SEBACEOUS CYST;  Surgeon: Cleotilde Ronal RAMAN, MD;  Location: [facility] OR;  Service: Gynecology;  Laterality: Left;   HYSTEROSCOPY WITH D & C N/A 06/29/2020    Procedure: DILATATION AND CURETTAGE /HYSTEROSCOPY WITH MYOSURE;  Surgeon: Cleotilde Ronal RAMAN, MD;  Location: [facility] OR;  Service: Gynecology;  Laterality: N/A;   KNEE ARTHROSCOPY WITH LATERAL MENISECTOMY Right 10/03/2020   Procedure: KNEE ARTHROSCOPY WITH LATERAL MENISECTOMY;  Surgeon: Suzzanne Charleston, MD;  Location: [facility];  Service: Orthopedics;  Laterality: Right;   LAPAROSCOPIC CHOLECYSTECTOMY  2001   SENTINEL NODE BIOPSY Left 06/29/2020   Procedure: LEFT SENTINEL LYMPH NODE BIOPSY;  Surgeon: Curvin Deward MOULD, MD;  Location: [facility] OR;  Service: General;  Laterality: Left;   Patient Active Problem List   Diagnosis Date Noted   Acute lateral meniscus tear of right knee, subsequent encounter 09/29/2020   Postmenopausal bleeding    Endometrial polyp    Vulvar cyst    Diabetes mellitus (HCC) 06/22/2020   Genetic testing 06/06/2020   Family history of breast cancer 05/30/2020   Family history of prostate cancer 05/30/2020   Family history of skin cancer 05/30/2020   Family history of pancreatic cancer 05/30/2020   Family history of renal cancer 05/30/2020   Malignant neoplasm of lower-outer quadrant of left breast of female, estrogen receptor positive (HCC) 05/18/2020   Hyperlipidemia 03/24/2020   Mixed hyperlipidemia 03/24/2020   Moderate  recurrent major depression (HCC) 03/24/2020   Snoring 03/24/2020   Vitamin D  deficiency 03/24/2020   Lichen sclerosus et atrophicus 05/11/2019   Sleep apnea    Essential hypertension, benign 11/29/2015   Class 3 severe obesity with serious comorbidity and body mass index (BMI) of 50.0 to 59.9 in adult 08/21/2014    PCP: Dyane Anthony RAMAN, FNP  REFERRING PROVIDER: Cleotilde Ronal RAMAN, MD   REFERRING DIAG: [phone] (ICD-10-CM) - Mixed stress and urge urinary incontinence   THERAPY DIAG:  Other abnormalities of gait and mobility  Muscle weakness (generalized)  Chronic pain of right knee  Rationale for Evaluation and Treatment:  Rehabilitation  ONSET DATE: pt reports 10 years ago and has got worse recently  SUBJECTIVE:                                                                                                                                                                                           SUBJECTIVE STATEMENT: Pt reports doing well.  Felling good today.   Fluid intake- eater, unsweet tea, 3 cups of coffee  PAIN:  Are you having pain? Yes bilat knees, R hip, R ankle NPRS scale: 3/10 Pain location: no pelvic pain  Pain type: aching Pain description: intermittent   Aggravating factors: lying on right side aggravates her hip, STS hurts her knees and legs, has toe pain as well Relieving factors: ibuprofen  PRECAUTIONS: None  RED FLAGS: None   WEIGHT BEARING RESTRICTIONS: No  FALLS:  Has patient fallen in last 6 months? No  OCCUPATION: retired [employer], does pottery  ACTIVITY LEVEL : more sedentary now, sometimes gets active  PLOF: Independent  PATIENT GOALS: to stop wearing pads every day. To get rid of incontinence during sex  PERTINENT HISTORY:  Breast cancer - was on estrogen blocker Sexual abuse: No  BOWEL MOVEMENT: no issues  URINATION: Pain with urination: No Fully empty bladder: Yes: if she rocks aftre she finishes peeing Stream: Strong Urgency: No Frequency: yes- on a diuretic Leakage: Urge to void, Walking to the bathroom, Coughing, Sneezing, Laughing, Intercourse, and has triggers- seeing  atoilet Pads: Yes: 3-4  INTERCOURSE:  Ability to have vaginal penetration Yes  Pain with intercourse: none DrynessYes - uses vaseline Climax: yes Marinoff Scale: 0/3 Laxative:no  PREGNANCY: Vaginal deliveries 1 Tearing Yes:   Episiotomy Yes  C-section deliveries 0 Currently pregnant No  PROLAPSE: Pressure- possibly   OBJECTIVE:  Note: Objective measures were completed at Evaluation unless otherwise noted.  DIAGNOSTIC FINDINGS:  None recently  PATIENT  SURVEYS:    PFIQ-7: 5  COGNITION: Overall cognitive status: Within functional limits for tasks assessed  SENSATION: Light touch: Appears intact  LUMBAR SPECIAL TESTS:  Straight leg raise test: Negative for pain  Single leg squat- difficult, has to hold on [employer]- has to hold on counter  GAIT: Assistive device utilized: None Comments: antalgic, appears difficult, wide stance, slow  POSTURE: rounded shoulders, forward head, increased lumbar lordosis, and anterior pelvic tilt   LUMBARAROM/PROM:  A/PROM A/PROM  Eval % of availability  Flexion 100  Extension 100  Right lateral flexion 75  Left lateral flexion 75  Right rotation 100  Left rotation 100   (Blank rows = not tested)  LOWER EXTREMITY ROM:  Passive ROM Right eval Left eval  Hip flexion    Hip extension    Hip abduction    Hip adduction    Hip internal rotation 25 D 5 D  Hip external rotation    Knee flexion    Knee extension    Ankle dorsiflexion    Ankle plantarflexion    Ankle inversion    Ankle eversion     (Blank rows = not tested)  LOWER EXTREMITY MMT:  MMT Right eval Left eval  Hip flexion 4+/5 4/5  Hip extension    Hip abduction    Hip adduction    Hip internal rotation    Hip external rotation    Knee flexion    Knee extension 4+/5 4+/5  Ankle dorsiflexion    Ankle plantarflexion    Ankle inversion    Ankle eversion     (Blank rows = not tested) PALPATION:   General: upper chest breathing strategies  Pelvic Alignment: even  Abdominal: diastasis present                External Perineal Exam: dryness present, signs of  decreased estrogen                              Internal Pelvic Floor: able to contract relax, bulge with good coordination  Patient confirms identification and approves PT to assess internal pelvic floor and treatment Yes Patient confirms identification and approves PT to assess internal pelvic floor and treatment Yes No emotional/communication  barriers or cognitive limitation. Patient is motivated to learn. Patient understands and agrees with treatment goals and plan. PT explains patient will be examined in standing, sitting, and lying down to see how their muscles and joints work. When they are ready, they will be asked to remove their underwear so PT can examine their perineum. The patient is also given the option of providing their own chaperone as one is not provided in our facility. The patient also has the right and is explained the right to defer or refuse any part of the evaluation or treatment including the internal exam. With the patient's consent, PT will use one gloved finger to gently assess the muscles of the pelvic floor, seeing how well it contracts and relaxes and if there is muscle symmetry. After, the patient will get dressed and PT and patient will discuss exam findings and plan of care. PT and patient discuss plan of care, schedule, attendance policy and HEP activities.     PELVIC MMT:   MMT eval  Vaginal 3/5, 3 s hold, 8 reps  Internal Anal Sphincter   External Anal Sphincter   Puborectalis   Diastasis Recti 2 fingers at umbilicus and above umbilicus with doming  (Blank rows = not tested)        TONE:  average  PROLAPSE: Mild anterior wall laxity present   [facility] Adult PT Treatment:                                            08/22/23 Pt seen for aquatic therapy today.  Treatment took place in water 3.5-4.75 ft in depth at the [facility] pool. Temp of water was 91.  Pt entered/exited the pool via stairs independently in step-to pattern with bilat rail.   Exercises - Hand buoy carry: Forward and Backward; bilaterally->unilaterally  - Side Stepping with Hand Floats   - Tandem Stance   - Single Leg Stance  - Heel Toe Raises with Counter Support   - Noodle press  - 1 x daily  - Leg swings flex/ext   - Leg Swing out to the side   - Standing 'L' Stretch at [employer]   - Cycling on  noodle/noodle wrapped under shoulders   - Warrior III   Pt requires the buoyancy and hydrostatic pressure of water for support, and to offload joints by unweighting joint load by at least 50 % in navel deep water and by at least 75-80% in chest to neck deep water.  Viscosity of the water is needed for resistance of strengthening. Water current perturbations provides challenge to standing balance requiring increased core activation.   TODAY'S TREATMENT:   There acts- review of HEP, progress, bladder habits, hydration Neuro reed- ball press with transverse abdominis breath                       Ball press seated with transverse abdominis breath 20 reps bilateral HA with green theraband 20 reps with transverse abdominis breath and pelvic floor contraction Dying bug with yoga ball 2x10 Hip adduction with ball with with green theraband 20 reps Overhead press with #5 20 reps STS with #10 20 reps more elevated table Nustep 5 mins level 7                       [facility] Adult PT Treatment:                                            08/15/23 Pt seen for aquatic therapy today.  Treatment took place in water 3.5-4.75 ft in depth at the [facility] pool. Temp of water was 91.  Pt entered/exited the pool via stairs independently in step-to pattern with bilat rail.  - unsupported - multiple laps walking forward/ backward - unsupported side stepping -> with arm addct/ abdct with yellow hand floats. Cues for abd bracing -standing ue support HB: df; pf; hip add/abd; traditional squats; hip flex/ext -warrior III with ue support YHB -walking between exercises for recovery - hip hinging: 2 x 5.  VC provided for execution -L stretch -> hip hiking -cycling on noodle  Pt requires the buoyancy and hydrostatic pressure of water for support, and to offload joints by unweighting joint load by at least 50 % in navel deep water and by at least 75-80% in chest to neck deep water.  Viscosity of the water is  needed for resistance of strengthening. Water current perturbations provides challenge to standing balance requiring increased core activation.    08/07/2023 Neuro- seated ball  press on unilat knees 15 reps with transverse abdominis breath  HA with thera band 10x2 reps Modified quarduped 15 reps with transverse abdominis breath  Shoulder extensions with green theraband with transverse abdominis breath 2x10 STS with transverse abdominis breath off higher surface  Lifting #5 off chair with transverse abdominis breath 2x10                                                                                                                            PATIENT EDUCATION:  Education details: intro to aquatic therapy   Person educated: Patient Education method: Explanation, Demonstration, Tactile cues, Verbal cues Education comprehension: verbalized understanding, returned demonstration, verbal cues required, tactile cues required, and needs further education  HOME EXERCISE PROGRAM: Access Code: [email or web address] URL: [email or web address] Date: 07/23/2023 Prepared by: Cori Helmus  Patient Education - Get To Know Your Pelvic Floor- Female - Urinary Urge Control Techniques - Bladder Retraining Strategies - Urinary Incontinence - Urinary Urge Control Techniques - Bladder Retraining   Aquatics This aquatic home exercise program from [employer] utilizes pictures from land based exercises, but has been adapted prior to lamination and issuance.   Access Code: [email or web address] URL: [email or web address] Date: 07/30/2023 Prepared by: Frankie Stpehen Petitjean  Exercises - Hand buoy carry: Forward and Backward; bilaterally->unilaterally  - 1 x daily - 7 x weekly - 3 sets - 10 reps - Side to Side Hamstring Stretch with Noodle at [employer]  - 1 x daily - 7 x weekly - 3 sets - 10 reps - Lobbyist with Noodle at [employer]  - 1 x daily - 7 x weekly - 3 sets - 10 reps - Side Stepping with Hand  Floats  - 1 x daily - 7 x weekly - 3 sets - 10 reps - Cycling on noodle/noodle wrapped under shoulders  - 1 x daily - 7 x weekly - 3 sets - 10 reps - Tandem Stance  - 1 x daily - 7 x weekly - 3 sets - 10 reps - Single Leg Stance  - 1 x daily - 7 x weekly - 3 sets - 10 reps - Heel Toe Raises with Counter Support  - 1 x daily - 7 x weekly - 3 sets - 10 reps - Noodle press  - 1 x daily - 7 x weekly - 3 sets - 10 reps  Updated: 08/22/23 Exercises - Hand buoy carry: Forward and Backward; bilaterally->unilaterally  - 1 x daily - 1-3 x weekly - Side Stepping with Hand Floats  - 1 x daily - 1-3 x weekly - Tandem Stance  - 1-3 x weekly - 1 sets - 3 reps - Single Leg Stance  - 1 x daily - 1-3 x weekly - 1 sets - 3 reps - Heel Toe Raises with Counter Support  - 1 x daily - 1-3 x weekly - 1-3 sets - 10 reps - Noodle press  - 1 x daily - 1-3 x weekly - 1-2 sets -  10 reps - Leg swings flex/ext  - 1 x daily - 1-3 x weekly - 1-2 sets - 10 reps - Leg Swing out to the side  - 1 x daily - 1-3 x weekly - 1-2 sets - 10 reps - Standing 'L' Stretch at [employer]  - 1 x daily - 1-3 x weekly - 1 sets - 3 reps - 10-20s hold - Cycling on noodle/noodle wrapped under shoulders  - 1 x daily - 1-3 x weekly - Warrior III  - 1 x daily - 1-3 x weekly - 1 sets - 10 reps  ASSESSMENT:  CLINICAL IMPRESSION: Pt reports good response to last aquatic session.  No pain or excessive fatigue. She is provided vc and demonstration for execution of exercises. No pain throughout session today in hips. Good toleration with added reps and foam for increased resistance and challenge . HEP updated, instruction begun and is issued.  Pt will bring back next/last session. Goals ongoing     OBJECTIVE IMPAIRMENTS: Abnormal gait, decreased activity tolerance, decreased balance, decreased endurance, decreased mobility, difficulty walking, decreased ROM, decreased strength, hypomobility, increased fascial restrictions, increased muscle spasms,  obesity, and pain.   ACTIVITY LIMITATIONS: standing, squatting, transfers, continence, toileting, and locomotion level  PARTICIPATION LIMITATIONS: community activity  PERSONAL FACTORS: Time since onset of injury/illness/exacerbation are also affecting patient's functional outcome.   REHAB POTENTIAL: Good  CLINICAL DECISION MAKING: Evolving/moderate complexity  EVALUATION COMPLEXITY: Moderate   GOALS: Goals reviewed with patient? Yes  SHORT TERM GOALS: Target date: 08/20/2023    Pt will be I and consistent with her HEP Baseline: Goal status: met  2.  Pt will be educated on vaginal moisturizers to improve vaginal tissue health and improve range of motion of the tissue for continence.  Baseline:  Goal status: INITIAL  3.  Pt will be educated on urge suppression techniques Baseline:  Goal status: met  4.  Pt will fill out a bladder diary to identify habits and triggers Baseline:  Goal status: INITIAL   LONG TERM GOALS: Target date: 01/22/2024  Pt will soak 0 pads/ day in order to run errands and not have to interrupt daily tasks.  Baseline:  Goal status: progressing, still leaks at night  2.  Pt will be consistent with bladder retraining to reduce urinary urgency and frequency and reduce the need to go to bathroom more then every 2 hrs Baseline:  Goal status: INITIAL  3.  Pt will be able to have intercourse without leaking urine Baseline:  Goal status: INITIAL  4.  Pt will be able to walk to the bathroom without leaking 100% of time Baseline:  Goal status: INITIAL  5.  Pt will report reduced knee pain to max 2/10 with walking at least 30 mins Baseline:  Goal status: INITIAL  6.  Pt will report max 2/10 back and hip pain with walking at least 30 mins Baseline:  Goal status: INITIAL  PLAN:  PT FREQUENCY: 1-2x/week  PT DURATION: 6 months  PLANNED INTERVENTIONS: 97110-Therapeutic exercises, 97530- Therapeutic activity, 97112- Neuromuscular re-education,  97535- Self Care, 02859- Manual therapy, [phone]- Aquatic Therapy, [phone]- Electrical stimulation (manual), [phone] (1-2 muscles), [phone] (3+ muscles)- Dry Needling, Patient/Family education, Balance training, Taping, Joint mobilization, Joint manipulation, Spinal manipulation, Spinal mobilization, Scar mobilization, Cryotherapy, Moist heat, and Biofeedback  PLAN FOR NEXT SESSION: urge suppression techniques, trigger education, core strengthening, hip stretches, bladder diary   Ronal Kem) Kayde Atkerson MPT 08/22/23 9:38 AM [facility] [address]  , [place], [address] Phone: [phone]   Fax:  [phone]  PHYSICAL THERAPY DISCHARGE SUMMARY  Visits from Start of Care: 7   Patient agrees to discharge. Patient goals were partially met. Patient is being discharged due to financial reasons.

## 2023-08-26 NOTE — Progress Notes (Unsigned)
 New Patient Evaluation and Consultation  Referring Provider: Cleotilde Ronal RAMAN, MD PCP: Dyane Anthony RAMAN, FNP Date of Service: 08/28/2023  SUBJECTIVE Chief Complaint: No chief complaint on file.  History of Present Illness: Anna Martinez is a 63 y.o. 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    Tried self directed PT, aquatic therapy at PT BMI 50 DM with ***  ***Review of records significant for: ***lichen sclerosus, endometrial polyp, PMB, ER+ breast cancer  Urinary Symptoms: Leaks urine with cough/ sneeze, laughing, exercise, during sex, with a full bladder, with movement to the bathroom, and with urgency Leaks *** time(s) per days.  Pad use: 3-4 pads per day.   Patient is bothered by UI symptoms.  Day time voids 4-7.  Nocturia: 2-3 times per night to void with sleep apnea Voiding dysfunction:  empties bladder well.  Patient does not use a catheter to empty bladder.  When urinating, patient feels the need to urinate multiple times in a row Drinks: ***oz water per day  UTIs: 0 UTI's in the last year.   {ACTIONS;DENIES/REPORTS:21021675::Denies} history of blood in urine, kidney or bladder stones, pyelonephritis, bladder cancer, and kidney cancer No results found for the last 90 days.   Pelvic Organ Prolapse Symptoms:                  Patient Denies a feeling of a bulge the vaginal area. It has been present for {NUMBER 1-10:22536} {days/wks/mos/yrs:310907}.  Patient {denies/ admits to:24761} seeing a bulge.  This bulge {ACTION; IS/IS WNU:78978602} bothersome.  Bowel Symptom: Bowel movements: 1 time(s) per day Stool consistency: soft  Straining: no.  Splinting: no.  Incomplete evacuation: no.  Patient Denies accidental bowel leakage / fecal incontinence Bowel regimen: diet and fiber Last colonoscopy: Results *** [facility] Colonoscopy          Upcoming     Colonoscopy (Every 10 Years) Next due on  11/08/2030    11/07/2020  COLONOSCOPY   Only the first 1 history entries have been loaded, but more history exists.                Sexual Function Sexually active: yes.  Sexual orientation: Straight Pain with sex: No  Pelvic Pain Denies pelvic pain   Past Medical History:  Past Medical History:  Diagnosis Date   Abnormal Pap smear 08/13/2012   LSIL may include HPV or mild Dysplasia (CIN1)   Acute lateral meniscus tear of right knee, subsequent encounter    Breast cancer (HCC) 05/2020   LEFT   Depression    Family history of breast cancer 05/30/2020   Family history of pancreatic cancer 05/30/2020   Family history of prostate cancer 05/30/2020   Family history of renal cancer 05/30/2020   Family history of skin cancer 05/30/2020   Gallbladder problem    Heart murmur    cardiologist a long time ago ECHO not completed   Hypertension    on meds   Knee pain    Lateral meniscus tear    right knee   Obesity    Other fatigue    Personal history of radiation therapy    Pre-diabetes    Shortness of breath on exertion    Sleep apnea    uses CPAP nightly   Vitamin D  deficiency      Past Surgical History:   Past Surgical History:  Procedure Laterality Date   BREAST LUMPECTOMY Left 06/29/2020  BREAST LUMPECTOMY WITH RADIOACTIVE SEED AND SENTINEL LYMPH NODE BIOPSY Left 06/29/2020   Procedure: LEFT BREAST LUMPECTOMY WITH RADIOACTIVE SEED;  Surgeon: Curvin Deward MOULD, MD;  Location: [facility] OR;  Service: General;  Laterality: Left;   CYST REMOVAL TRUNK Left 06/29/2020   Procedure: REMOVAL OF SEBACEOUS CYST;  Surgeon: Cleotilde Ronal RAMAN, MD;  Location: [facility] OR;  Service: Gynecology;  Laterality: Left;   HYSTEROSCOPY WITH D & C N/A 06/29/2020   Procedure: DILATATION AND CURETTAGE /HYSTEROSCOPY WITH MYOSURE;  Surgeon: Cleotilde Ronal RAMAN, MD;  Location: [facility] OR;  Service: Gynecology;  Laterality: N/A;   KNEE ARTHROSCOPY WITH LATERAL MENISECTOMY Right 10/03/2020   Procedure: KNEE ARTHROSCOPY  WITH LATERAL MENISECTOMY;  Surgeon: Najah Charleston, MD;  Location: [facility];  Service: Orthopedics;  Laterality: Right;   LAPAROSCOPIC CHOLECYSTECTOMY  2001   SENTINEL NODE BIOPSY Left 06/29/2020   Procedure: LEFT SENTINEL LYMPH NODE BIOPSY;  Surgeon: Curvin Deward MOULD, MD;  Location: [facility] OR;  Service: General;  Laterality: Left;     Past OB/GYN History: OB History  Gravida Para Term Preterm AB Living  1 1    1   SAB IAB Ectopic Multiple Live Births          # Outcome Date GA Lbr Len/2nd Weight Sex Type Anes PTL Lv  1 Para 02/1996    F Vag-Spont       Vaginal deliveries: ***,  Forceps/ Vacuum deliveries: ***, Cesarean section: 0 Menopausal: Yes, at age 44, Denies vaginal bleeding since menopause Contraception: s/p menopause. Last pap smear: Any history of abnormal pap smears: no.    Component Value Date/Time   DIAGPAP  03/30/2021 1106    - Negative for intraepithelial lesion or malignancy (NILM)   DIAGPAP  02/10/2018 0000    NEGATIVE FOR INTRAEPITHELIAL LESIONS OR MALIGNANCY.   HPVHIGH Negative 03/30/2021 1106   ADEQPAP  03/30/2021 1106    Satisfactory for evaluation; transformation zone component ABSENT.   ADEQPAP  02/10/2018 0000    Satisfactory for evaluation  endocervical/transformation zone component ABSENT.    Medications: Patient has a current medication list which includes the following prescription(s): mometasone , nystatin  cream, and valsartan-hydrochlorothiazide.   Allergies: Patient is allergic to bactrim  [sulfamethoxazole -trimethoprim ], penicillins, and sulfa  antibiotics.   Social History:  Social History   Tobacco Use   Smoking status: Never   Smokeless tobacco: Never  Vaping Use   Vaping status: Never Used  Substance Use Topics   Alcohol use: No   Drug use: No    Relationship status: long-term partner Patient lives with her long term partner.   Patient is not employed. Regular exercise: Yes: yoga and aerobics History of abuse:  No  Family History:   Family History  Problem Relation Age of Onset   Hypertension Mother    Hyperlipidemia Mother    Heart disease Mother    Obesity Mother    Skin cancer Mother    Hypertension Father    Obesity Father    Prostate cancer Father        dx 52s   Renal cancer Father        dx 87s   Cancer Father        mouth; dx after 26   Skin cancer Father        dx after 50   Melanoma Maternal Uncle        mets; dx 92s   Pancreatic cancer Paternal Aunt        dx 75s   Cancer Paternal  Uncle        unknown type; dx unknown age   Skin cancer Maternal Grandmother        face   Cancer Paternal Grandmother        unknown type; ? pancreatic; dx after 30   Breast cancer Cousin        dx 55s; paternal cousin   Colon cancer Neg Hx    Colon polyps Neg Hx    Esophageal cancer Neg Hx    Rectal cancer Neg Hx    Stomach cancer Neg Hx      Review of Systems: Review of Systems  Constitutional:  Positive for weight loss. Negative for fever and malaise/fatigue.  Respiratory:  Negative for cough, shortness of breath and wheezing.   Cardiovascular:  Negative for chest pain, palpitations and leg swelling.  Gastrointestinal:  Negative for abdominal pain, blood in stool and constipation.  Genitourinary:  Positive for frequency and urgency. Negative for dysuria and hematuria.       Leakage  Skin:  Negative for rash.  Neurological:  Negative for dizziness, weakness and headaches.  Endo/Heme/Allergies:  Does not bruise/bleed easily.  Psychiatric/Behavioral:  Negative for depression. The patient is not nervous/anxious.      OBJECTIVE Physical Exam: There were no vitals filed for this visit.  Physical Exam Constitutional:      General: She is not in acute distress.    Appearance: Normal appearance.  Genitourinary:     Bladder and urethral meatus normal.     No lesions in the vagina.     Right Labia: No rash, tenderness, lesions, skin changes or Bartholin's cyst.    Left Labia:  No tenderness, lesions, skin changes, Bartholin's cyst or rash.    No vaginal discharge, erythema, tenderness, bleeding, ulceration or granulation tissue.      Right Adnexa: not tender, not full and no mass present.    Left Adnexa: not tender, not full and no mass present.    No cervical motion tenderness, discharge, friability, lesion, polyp or nabothian cyst.     Uterus is not enlarged, fixed, tender or irregular.     No uterine mass detected.    Urethral meatus caruncle not present.    No urethral prolapse, tenderness, mass, hypermobility or discharge present.     Bladder is not tender, urgency on palpation not present and masses not present.      Levator ani not tender, obturator internus not tender, no asymmetrical contractions present and no pelvic spasms present.    Anal wink present and BC reflex present. Cardiovascular:     Rate and Rhythm: Normal rate.  Pulmonary:     Effort: Pulmonary effort is normal. No respiratory distress.  Abdominal:     General: There is no distension.     Palpations: There is no mass.     Tenderness: There is no abdominal tenderness.     Hernia: No hernia is present.  Neurological:     Mental Status: She is alert.  Vitals reviewed. Exam conducted with a chaperone present.      POP-Q:   POP-Q                                               Aa  Ba                                                 C                                                Gh                                               Pb                                               tvl                                                Ap                                               Bp                                                 D      Rectal Exam:  Normal sphincter tone, {rectocele:24766} distal rectocele, enterocoele {DESC; PRESENT/NOT PRESENT:21021351}, no rectal masses, {sign of:24767} dyssynergia when asking the patient  to bear down.  Post-Void Residual (PVR) by Bladder Scan: In order to evaluate bladder emptying, we discussed obtaining a postvoid residual and patient agreed to this procedure.  Procedure: The ultrasound unit was placed on the patient's abdomen in the suprapubic region after the patient had voided.      Laboratory Results: Lab Results  Component Value Date   COLORU yellow 02/10/2018   CLARITYU clear 02/10/2018   GLUCOSEUR Negative 02/10/2018   BILIRUBINUR negative 02/10/2018   KETONESU neg 02/10/2018   RBCUR neg 02/10/2018   PHUR 5.0 02/10/2018   PROTEINUR Negative 02/10/2018   UROBILINOGEN negative (A) 02/10/2018   LEUKOCYTESUR Negative 02/10/2018    Lab Results  Component Value Date   CREATININE 0.79 01/17/2023   CREATININE 0.65 07/18/2022   CREATININE 0.72 04/11/2021    Lab Results  Component Value Date   HGBA1C 6.6 (H) 01/19/2021    Lab Results  Component Value Date   HGB 13.4 01/17/2023     ASSESSMENT AND PLAN Ms. Selders is a 63 y.o. with: No diagnosis found.  There are no diagnoses linked to this encounter.   Lianne ONEIDA Gillis, MD

## 2023-08-28 ENCOUNTER — Ambulatory Visit (INDEPENDENT_AMBULATORY_CARE_PROVIDER_SITE_OTHER): Admitting: Obstetrics

## 2023-08-28 ENCOUNTER — Encounter: Payer: Self-pay | Admitting: Obstetrics

## 2023-08-28 VITALS — BP 123/82 | HR 61 | Ht 68.11 in | Wt 296.4 lb

## 2023-08-28 DIAGNOSIS — N3946 Mixed incontinence: Secondary | ICD-10-CM | POA: Insufficient documentation

## 2023-08-28 DIAGNOSIS — N898 Other specified noninflammatory disorders of vagina: Secondary | ICD-10-CM | POA: Diagnosis not present

## 2023-08-28 DIAGNOSIS — N8110 Cystocele, unspecified: Secondary | ICD-10-CM | POA: Diagnosis not present

## 2023-08-28 DIAGNOSIS — Z6841 Body Mass Index (BMI) 40.0 and over, adult: Secondary | ICD-10-CM

## 2023-08-28 DIAGNOSIS — R351 Nocturia: Secondary | ICD-10-CM | POA: Insufficient documentation

## 2023-08-28 LAB — POCT URINALYSIS DIP (CLINITEK)
Bilirubin, UA: NEGATIVE
Blood, UA: NEGATIVE
Glucose, UA: NEGATIVE mg/dL
Leukocytes, UA: NEGATIVE
Nitrite, UA: NEGATIVE
POC PROTEIN,UA: NEGATIVE
Spec Grav, UA: 1.02 (ref 1.010–1.025)
Urobilinogen, UA: 0.2 U/dL
pH, UA: 5.5 (ref 5.0–8.0)

## 2023-08-28 MED ORDER — GEMTESA 75 MG PO TABS: 75.0000 mg | ORAL_TABLET | Freq: Every day | ORAL | 2 refills | Status: AC

## 2023-08-28 MED ORDER — GEMTESA 75 MG PO TABS: 75.0000 mg | ORAL_TABLET | Freq: Every day | ORAL | Status: AC

## 2023-08-28 NOTE — Patient Instructions (Addendum)
 We discussed the symptoms of overactive bladder (OAB), which include urinary urgency, urinary frequency, night-time urination, with or without urge incontinence.  We discussed management including behavioral therapy (decreasing bladder irritants by following a bladder diet, urge suppression strategies, timed voids, bladder retraining), physical therapy, medication; and for refractory cases posterior tibial nerve stimulation, sacral neuromodulation, and intravesical botulinum toxin injection.   For Beta-3 agonist medication, we discussed the potential side effect of elevated blood pressure which is more likely to occur in individuals with uncontrolled hypertension. You were given samples for Gemtesa  75 mg.  It can take a month to start working so give it time, but if you have bothersome side effects call sooner and we can try a different medication.  Call us  if you have trouble filling the prescription or if it's not covered by your insurance.  For treatment of stress urinary incontinence, which is leakage with physical activity/movement/strainging/coughing, we discussed expectant management versus nonsurgical options versus surgery. Nonsurgical options include weight loss, physical therapy, as well as a pessary.  Surgical options include a midurethral sling, which is a synthetic mesh sling that acts like a hammock under the urethra to prevent leakage of urine, a Burch urethropexy, and transurethral injection of a bulking agent.   For night time frequency: - avoid fluid intake 3 hours before bedtime - elevated your feet during the day or use compression socks to reduce lower extremity swelling - due to snoring/continue CPAP for sleep apnea  Continue pelvic floor physical therapy and weight reduction.   Try vaginal moisturizers such as Replens or Revaree, or Vitamin E suppositories.   You have a stage 1 (out of 4) prolapse.  We discussed the fact that it is not life threatening but there are several  treatment options. For treatment of pelvic organ prolapse, we discussed options for management including expectant management, conservative management, and surgical management, such as Kegels, a pessary, pelvic floor physical therapy, and specific surgical procedures.

## 2023-08-28 NOTE — Assessment & Plan Note (Addendum)
-   lost 60lbs over 2 years with intermittent fasting and aquatic therapy - encouraged to continue weight reduction due to association with pelvic floor disorders

## 2023-08-28 NOTE — Assessment & Plan Note (Addendum)
-   avoid fluid intake 3 hours before bedtime - elevated feet during the day or use compression socks to reduce lower extremity swelling if swelling returns - continue CPAP for sleep apnea - reassess after trial of gemtesa  - consider switching diovan dosing to 2pm

## 2023-08-28 NOTE — Assessment & Plan Note (Addendum)
-   asymptomatic - For treatment of pelvic organ prolapse, we discussed options for management including expectant management, conservative management, and surgical management, such as Kegels, a pessary, pelvic floor physical therapy, and specific surgical procedures. - continue pelvic floor PT and weight reduction - consider trial of pessary to assess impact on urinary symptoms - encouraged fiber supplementation to optimize stool consistency due to Type VI stools

## 2023-08-28 NOTE — Assessment & Plan Note (Signed)
-   h/o ER+ breast cancer s/p left breast lumpectomy, self discontinued Tamoxifen  around 1 year ago due to joint ache - h/o endometrial polypectomy with D&C by Dr. Cleotilde in 06/29/20 - uses Vaseline at the time of intercourse - encouraged, lubricant use, vaginal moisturizer use or Vitamin E suppositories for relief

## 2023-08-28 NOTE — Assessment & Plan Note (Addendum)
-   POCT UA negative, PVR 81mL - urgency > stress - We discussed the symptoms of overactive bladder (OAB), which include urinary urgency, urinary frequency, nocturia, with or without urge incontinence.  While we do not know the exact etiology of OAB, several treatment options exist. We discussed management including behavioral therapy (decreasing bladder irritants, urge suppression strategies, timed voids, bladder retraining), physical therapy, medication; for refractory cases posterior tibial nerve stimulation, sacral neuromodulation, and intravesical botulinum toxin injection.  For anticholinergic medications, we discussed the potential side effects of anticholinergics including dry eyes, dry mouth, constipation, cognitive impairment and urinary retention. For Beta-3 agonist medication, we discussed the potential side effect of elevated blood pressure which is more likely to occur in individuals with uncontrolled hypertension. - trial of gemtesa   - encouraged bladder training and caffeine reduction - continue pelvic floor PT and weight reduction - For treatment of stress urinary incontinence,  non-surgical options include expectant management, weight loss, physical therapy, as well as a pessary.  Surgical options include a midurethral sling, Burch urethropexy, and transurethral injection of a bulking agent. - consider urethral bulking

## 2023-08-29 ENCOUNTER — Encounter: Payer: Self-pay | Admitting: Physical Therapy

## 2023-08-29 ENCOUNTER — Ambulatory Visit (HOSPITAL_BASED_OUTPATIENT_CLINIC_OR_DEPARTMENT_OTHER): Admitting: Physical Therapy

## 2023-09-02 ENCOUNTER — Ambulatory Visit: Admitting: Physical Therapy

## 2023-09-04 NOTE — Progress Notes (Signed)
 [place] APPROVED on Cover My Meds  Approval Date: 09/03/2023 - 09/02/2025 PA Ref #: [id number]  Pharmacy has been notified

## 2023-09-10 ENCOUNTER — Ambulatory Visit: Admitting: Physical Therapy

## 2023-11-29 ENCOUNTER — Ambulatory Visit: Admitting: Obstetrics

## 2023-12-18 ENCOUNTER — Telehealth: Payer: Self-pay | Admitting: Nurse Practitioner

## 2023-12-18 NOTE — Telephone Encounter (Signed)
 LVM  provider will not be here and change times and dates.

## 2024-01-23 ENCOUNTER — Inpatient Hospital Stay: Payer: Self-pay

## 2024-01-23 ENCOUNTER — Ambulatory Visit: Payer: BC Managed Care – PPO | Admitting: Nurse Practitioner

## 2024-01-23 ENCOUNTER — Other Ambulatory Visit: Payer: BC Managed Care – PPO

## 2024-01-23 ENCOUNTER — Inpatient Hospital Stay: Payer: Self-pay | Admitting: Nurse Practitioner

## 2024-02-17 ENCOUNTER — Other Ambulatory Visit: Payer: Self-pay | Admitting: Obstetrics & Gynecology

## 2024-02-17 DIAGNOSIS — Z853 Personal history of malignant neoplasm of breast: Secondary | ICD-10-CM

## 2024-03-10 ENCOUNTER — Inpatient Hospital Stay: Payer: Self-pay | Admitting: Nurse Practitioner

## 2024-03-10 ENCOUNTER — Inpatient Hospital Stay: Payer: Self-pay

## 2024-04-08 ENCOUNTER — Inpatient Hospital Stay

## 2024-04-08 ENCOUNTER — Inpatient Hospital Stay: Admitting: Nurse Practitioner

## 2024-05-11 ENCOUNTER — Encounter

## 2024-05-25 ENCOUNTER — Ambulatory Visit (HOSPITAL_BASED_OUTPATIENT_CLINIC_OR_DEPARTMENT_OTHER): Admitting: Obstetrics & Gynecology
